# Patient Record
Sex: Female | Born: 1950
Health system: Southern US, Community
[De-identification: ages and names within clinical notes are randomized; demographics above are authoritative.]

## PROBLEM LIST (undated history)

## (undated) DIAGNOSIS — E113599 Type 2 diabetes mellitus with proliferative diabetic retinopathy without macular edema, unspecified eye: Secondary | ICD-10-CM

## (undated) DIAGNOSIS — K219 Gastro-esophageal reflux disease without esophagitis: Secondary | ICD-10-CM

## (undated) DIAGNOSIS — I10 Essential (primary) hypertension: Secondary | ICD-10-CM

## (undated) DIAGNOSIS — Z9849 Cataract extraction status, unspecified eye: Secondary | ICD-10-CM

## (undated) DIAGNOSIS — S91209A Unspecified open wound of unspecified toe(s) with damage to nail, initial encounter: Secondary | ICD-10-CM

## (undated) DIAGNOSIS — G20A1 Parkinson's disease without dyskinesia, without mention of fluctuations: Secondary | ICD-10-CM

## (undated) DIAGNOSIS — G20 Parkinson's disease: Secondary | ICD-10-CM

## (undated) DIAGNOSIS — E785 Hyperlipidemia, unspecified: Secondary | ICD-10-CM

## (undated) DIAGNOSIS — I2510 Atherosclerotic heart disease of native coronary artery without angina pectoris: Secondary | ICD-10-CM

## (undated) HISTORY — DX: Unspecified open wound of unspecified toe(s) with damage to nail, initial encounter: S91.209A

## (undated) HISTORY — PX: OTHER SURGICAL HISTORY: SHX169

## (undated) HISTORY — DX: Type 2 diabetes mellitus with proliferative diabetic retinopathy without macular edema, unspecified eye: E11.3599

## (undated) HISTORY — DX: Hyperlipidemia, unspecified: E78.5

## (undated) HISTORY — DX: Cataract extraction status, unspecified eye: Z98.49

## (undated) HISTORY — DX: Atherosclerotic heart disease of native coronary artery without angina pectoris: I25.10

## (undated) HISTORY — PX: CARDIAC CATHETERIZATION: SHX172

---

## 1999-05-16 ENCOUNTER — Encounter: Payer: Self-pay | Admitting: Cardiology

## 1999-05-16 ENCOUNTER — Ambulatory Visit (HOSPITAL_COMMUNITY): Admission: RE | Admit: 1999-05-16 | Discharge: 1999-05-16 | Payer: Self-pay | Admitting: Cardiology

## 2002-08-26 ENCOUNTER — Emergency Department (HOSPITAL_COMMUNITY): Admission: AD | Admit: 2002-08-26 | Discharge: 2002-08-26 | Payer: Self-pay | Admitting: *Deleted

## 2002-08-31 ENCOUNTER — Encounter: Admission: RE | Admit: 2002-08-31 | Discharge: 2002-08-31 | Payer: Self-pay | Admitting: Internal Medicine

## 2002-09-21 ENCOUNTER — Encounter: Admission: RE | Admit: 2002-09-21 | Discharge: 2002-09-21 | Payer: Self-pay | Admitting: Internal Medicine

## 2002-10-10 ENCOUNTER — Encounter: Admission: RE | Admit: 2002-10-10 | Discharge: 2002-10-10 | Payer: Self-pay | Admitting: Internal Medicine

## 2002-10-10 ENCOUNTER — Ambulatory Visit (HOSPITAL_COMMUNITY): Admission: RE | Admit: 2002-10-10 | Discharge: 2002-10-10 | Payer: Self-pay | Admitting: Internal Medicine

## 2002-10-23 ENCOUNTER — Encounter: Admission: RE | Admit: 2002-10-23 | Discharge: 2003-01-21 | Payer: Self-pay | Admitting: Internal Medicine

## 2002-10-24 ENCOUNTER — Encounter: Admission: RE | Admit: 2002-10-24 | Discharge: 2002-10-24 | Payer: Self-pay | Admitting: Internal Medicine

## 2002-11-07 ENCOUNTER — Encounter: Admission: RE | Admit: 2002-11-07 | Discharge: 2002-11-07 | Payer: Self-pay | Admitting: Internal Medicine

## 2002-11-14 ENCOUNTER — Emergency Department (HOSPITAL_COMMUNITY): Admission: EM | Admit: 2002-11-14 | Discharge: 2002-11-15 | Payer: Self-pay | Admitting: Emergency Medicine

## 2002-11-22 ENCOUNTER — Encounter: Admission: RE | Admit: 2002-11-22 | Discharge: 2002-11-22 | Payer: Self-pay | Admitting: Internal Medicine

## 2002-12-22 ENCOUNTER — Encounter: Admission: RE | Admit: 2002-12-22 | Discharge: 2002-12-22 | Payer: Self-pay | Admitting: Internal Medicine

## 2003-02-07 ENCOUNTER — Ambulatory Visit (HOSPITAL_COMMUNITY): Admission: RE | Admit: 2003-02-07 | Discharge: 2003-02-07 | Payer: Self-pay | Admitting: Infectious Diseases

## 2003-02-07 ENCOUNTER — Encounter: Admission: RE | Admit: 2003-02-07 | Discharge: 2003-02-07 | Payer: Self-pay | Admitting: Internal Medicine

## 2003-02-15 ENCOUNTER — Encounter: Admission: RE | Admit: 2003-02-15 | Discharge: 2003-02-15 | Payer: Self-pay | Admitting: Internal Medicine

## 2003-05-03 ENCOUNTER — Encounter: Admission: RE | Admit: 2003-05-03 | Discharge: 2003-05-03 | Payer: Self-pay | Admitting: Internal Medicine

## 2004-05-09 ENCOUNTER — Ambulatory Visit: Payer: Self-pay | Admitting: Internal Medicine

## 2004-05-23 ENCOUNTER — Ambulatory Visit: Payer: Self-pay | Admitting: Internal Medicine

## 2004-06-17 ENCOUNTER — Ambulatory Visit: Payer: Self-pay | Admitting: Internal Medicine

## 2004-09-25 ENCOUNTER — Ambulatory Visit: Payer: Self-pay | Admitting: Internal Medicine

## 2004-11-06 ENCOUNTER — Ambulatory Visit: Payer: Self-pay | Admitting: Internal Medicine

## 2004-11-20 ENCOUNTER — Ambulatory Visit (HOSPITAL_COMMUNITY): Admission: RE | Admit: 2004-11-20 | Discharge: 2004-11-20 | Payer: Self-pay | Admitting: Internal Medicine

## 2004-11-20 ENCOUNTER — Ambulatory Visit: Payer: Self-pay | Admitting: Internal Medicine

## 2004-12-02 ENCOUNTER — Ambulatory Visit: Payer: Self-pay | Admitting: Internal Medicine

## 2004-12-24 ENCOUNTER — Ambulatory Visit (HOSPITAL_COMMUNITY): Admission: RE | Admit: 2004-12-24 | Discharge: 2004-12-24 | Payer: Self-pay | Admitting: Internal Medicine

## 2005-04-28 ENCOUNTER — Ambulatory Visit: Payer: Self-pay | Admitting: Internal Medicine

## 2005-05-27 ENCOUNTER — Ambulatory Visit: Payer: Self-pay | Admitting: Internal Medicine

## 2005-06-24 ENCOUNTER — Ambulatory Visit: Payer: Self-pay | Admitting: Internal Medicine

## 2005-07-15 ENCOUNTER — Encounter: Payer: Self-pay | Admitting: Vascular Surgery

## 2005-07-15 ENCOUNTER — Ambulatory Visit (HOSPITAL_COMMUNITY): Admission: RE | Admit: 2005-07-15 | Discharge: 2005-07-15 | Payer: Self-pay | Admitting: Internal Medicine

## 2005-07-15 ENCOUNTER — Encounter (INDEPENDENT_AMBULATORY_CARE_PROVIDER_SITE_OTHER): Payer: Self-pay | Admitting: *Deleted

## 2006-02-12 ENCOUNTER — Ambulatory Visit (HOSPITAL_COMMUNITY): Admission: RE | Admit: 2006-02-12 | Discharge: 2006-02-12 | Payer: Self-pay | Admitting: Family Medicine

## 2006-07-08 ENCOUNTER — Encounter: Admission: RE | Admit: 2006-07-08 | Discharge: 2006-07-08 | Payer: Self-pay | Admitting: Orthopaedic Surgery

## 2006-07-27 ENCOUNTER — Encounter: Admission: RE | Admit: 2006-07-27 | Discharge: 2006-07-27 | Payer: Self-pay | Admitting: Orthopaedic Surgery

## 2008-04-13 DIAGNOSIS — E113599 Type 2 diabetes mellitus with proliferative diabetic retinopathy without macular edema, unspecified eye: Secondary | ICD-10-CM

## 2008-04-13 DIAGNOSIS — R011 Cardiac murmur, unspecified: Secondary | ICD-10-CM

## 2008-04-13 DIAGNOSIS — I10 Essential (primary) hypertension: Secondary | ICD-10-CM

## 2008-04-13 DIAGNOSIS — I2510 Atherosclerotic heart disease of native coronary artery without angina pectoris: Secondary | ICD-10-CM

## 2008-04-13 DIAGNOSIS — E785 Hyperlipidemia, unspecified: Secondary | ICD-10-CM

## 2008-04-13 DIAGNOSIS — E113593 Type 2 diabetes mellitus with proliferative diabetic retinopathy without macular edema, bilateral: Secondary | ICD-10-CM

## 2008-04-13 HISTORY — DX: Type 2 diabetes mellitus with proliferative diabetic retinopathy without macular edema, unspecified eye: E11.3599

## 2008-04-13 HISTORY — DX: Atherosclerotic heart disease of native coronary artery without angina pectoris: I25.10

## 2008-04-13 HISTORY — DX: Hyperlipidemia, unspecified: E78.5

## 2008-04-17 ENCOUNTER — Ambulatory Visit: Payer: Self-pay | Admitting: Cardiovascular Disease

## 2008-04-17 ENCOUNTER — Encounter: Payer: Self-pay | Admitting: Cardiovascular Disease

## 2008-04-26 ENCOUNTER — Telehealth (INDEPENDENT_AMBULATORY_CARE_PROVIDER_SITE_OTHER): Payer: Self-pay | Admitting: Radiology

## 2008-04-30 ENCOUNTER — Ambulatory Visit: Payer: Self-pay

## 2008-04-30 ENCOUNTER — Encounter: Payer: Self-pay | Admitting: Cardiology

## 2008-05-09 ENCOUNTER — Ambulatory Visit: Payer: Self-pay | Admitting: Cardiology

## 2008-05-09 LAB — CONVERTED CEMR LAB: Pro B Natriuretic peptide (BNP): 36 pg/mL (ref 0.0–100.0)

## 2008-08-30 ENCOUNTER — Encounter (INDEPENDENT_AMBULATORY_CARE_PROVIDER_SITE_OTHER): Payer: Self-pay | Admitting: *Deleted

## 2010-05-23 NOTE — Cardiovascular Report (Signed)
[place]  Patient:    Destiny Russell, Destiny Russell                     MRN: [id number] Proc. Date: 05/16/99 Adm. Date:  [id number] Disc. Date: [id number] Attending:  Ronaldo Miyamoto CC:         Cardiac Catheterization Lab             Tammy R. Collins [name], [name]             Madolyn Frieze Jens Som, [name] [facility]                        Cardiac Catheterization  INDICATIONS:  Destiny Russell is a pleasant 60 year old diabetic, hypertensive white female with a history of chest discomfort.  This has gotten slightly worse over the past year.  She is now referred for cardiac catheterization.  PROCEDURE: 1. Left heart catheterization. 2. Selective coronary angiography. 3. Selective left ventriculography.  DESCRIPTION OF PROCEDURE:  The procedure was performed from the right femoral artery using 6-French catheters.  She tolerated the procedure without complications.  Intracoronary nitroglycerin was given in the right coronary artery.  HEMODYNAMIC DATA:  The initial central aortic pressure was 150/80.  LV pressure 137/15.  No gradient was recorded on pullback across the aortic valve.  ANGIOGRAPHIC DATA:  Ventriculography in the RAO projection reveals preserved global systolic function.  Ejection fraction was estimated at greater than 60%.  No segmental wall motion abnormalities were identified.  1. The left main coronary artery was free of significant disease. 2. The left anterior descending artery courses to the apex.  No focal areas of    abnormality are noted in the distal LAD although it tapers and covers the    distal portion of the inferior wall.  There is a major first diagonal    branch.  The diagonal branch has a sub branch that is small and has about    an 80% area of stenosis in it. 3. The circumflex provides one major marginal branch.  There is minimal    luminal irregularity in the mid portion of the vessel.  In the distal most    portion of the vessel, the  vessel tapers, and there is a 90% stenosis in an    area of a vessel that measures less than 1 mm in size.  This is in the    distal most aspect of the circumflex. 4. The right coronary artery is a dominant vessel.  There is a modest sized    acute marginal branch that is small in caliber and has about an 80%    stenosis proximally.  This vessel is about 1-1.5 mm in size.  The posterior    descending and posterolateral systems are relatively small in caliber as    well without critical disease.  CONCLUSIONS: 1. Normal left ventricular function. 2. Significant lesions involving a diagonal sub branch, very distal circumflex    marginal, and acute marginal in the right coronary artery.  DISPOSITION:  These vessels are all very small in caliber and not ideal for percutaneous intervention.  Given their distal location, we would recommend medical therapy.  They have the typical diabetic appearance.    DD:  05/16/99 TD:  05/18/99 Job: [id number]

## 2011-03-09 ENCOUNTER — Other Ambulatory Visit: Payer: Self-pay | Admitting: Internal Medicine

## 2011-03-09 MED ORDER — ATENOLOL 100 MG PO TABS: 100.0000 mg | ORAL_TABLET | Freq: Every day | ORAL | Status: DC

## 2011-03-09 NOTE — Progress Notes (Signed)
Pt has new appt later this month. Out of or just about of of med. I refilled med for one month so that pt would be on home meds when sees MD to see level of control and expedite care. I know I am taking a risk as I have no labs or BP measurement but the risks of continuing meds for 30 days prior to new pt appt is low and I am willing to accept that risk so that we have accurate BP on home meds when she sees Dr Gilford Rile later this month.

## 2011-03-27 ENCOUNTER — Ambulatory Visit (INDEPENDENT_AMBULATORY_CARE_PROVIDER_SITE_OTHER): Payer: Self-pay | Admitting: Internal Medicine

## 2011-03-27 ENCOUNTER — Encounter: Payer: Self-pay | Admitting: Internal Medicine

## 2011-03-27 VITALS — BP 176/87 | HR 63 | Temp 97.1°F | Ht 64.0 in | Wt 213.1 lb

## 2011-03-27 DIAGNOSIS — I2510 Atherosclerotic heart disease of native coronary artery without angina pectoris: Secondary | ICD-10-CM

## 2011-03-27 DIAGNOSIS — E785 Hyperlipidemia, unspecified: Secondary | ICD-10-CM

## 2011-03-27 DIAGNOSIS — Z79899 Other long term (current) drug therapy: Secondary | ICD-10-CM

## 2011-03-27 DIAGNOSIS — E119 Type 2 diabetes mellitus without complications: Secondary | ICD-10-CM

## 2011-03-27 DIAGNOSIS — I10 Essential (primary) hypertension: Secondary | ICD-10-CM

## 2011-03-27 DIAGNOSIS — Z8639 Personal history of other endocrine, nutritional and metabolic disease: Secondary | ICD-10-CM

## 2011-03-27 MED ORDER — INSULIN NPH ISOPHANE & REGULAR (70-30) 100 UNIT/ML ~~LOC~~ SUSP: SUBCUTANEOUS | Status: DC

## 2011-03-27 MED ORDER — ENALAPRIL MALEATE 20 MG PO TABS: 20.0000 mg | ORAL_TABLET | Freq: Two times a day (BID) | ORAL | Status: DC

## 2011-03-27 MED ORDER — ENALAPRIL MALEATE 20 MG PO TABS: 20.0000 mg | ORAL_TABLET | Freq: Every day | ORAL | Status: DC

## 2011-03-27 MED ORDER — METFORMIN HCL 1000 MG PO TABS: 1000.0000 mg | ORAL_TABLET | Freq: Two times a day (BID) | ORAL | Status: DC

## 2011-03-27 MED ORDER — ASPIRIN EC 81 MG PO TBEC: 81.0000 mg | DELAYED_RELEASE_TABLET | Freq: Every day | ORAL | Status: AC

## 2011-03-27 NOTE — Patient Instructions (Signed)
She states her insulin 40 units in the morning and 30 units in the evening Increase enalapril to 20 mg twice daily.

## 2011-03-27 NOTE — Assessment & Plan Note (Signed)
Poorly controlled on atenolol and enalapril, increase enalapril to 20 mg twice daily. If her blood pressure remains elevated at next visit consider adding a diuretic

## 2011-03-27 NOTE — Assessment & Plan Note (Signed)
Poorly controlled, A1c 11.6 today, advised the patient to take insulin 70/30 twice a day with a dose of 40 units in the morning and 30 units in the evening. Also advised to check sugars and come back in 2 weeks.

## 2011-03-27 NOTE — Progress Notes (Signed)
Patient ID: Destiny Russell, female   DOB: 03/26/1950, 61 y.o.   MRN: [id number]  HPI:   Patient is a pleasant 60 year old female presents to the clinic to establish care, patient is also working with our Child psychotherapist to obtain the [employer] card [employer] purposes. Patient denies any complaints, but does report that she has not been compliant with her medications due to cost, she is taking insulin once a day, and not compliant with her oral agents. No other complaints today  Review of Systems: Negative except per history of present illness  Physical Exam:  Nursing notes and vitals reviewed General:  alert, well-developed, and cooperative to examination.   Lungs:  normal respiratory effort, no accessory muscle use, normal breath sounds, no crackles, and no wheezes. Heart:  normal rate, regular rhythm, no murmurs, no gallop, and no rub.   Abdomen:  soft, non-tender, normal bowel sounds, no distention, no guarding, no rebound tenderness, no hepatomegaly, and no splenomegaly.   Extremities:  No cyanosis, clubbing, edema Neurologic:  alert & oriented X3, nonfocal exam  Meds: Medications Prior to Admission  Medication Sig Dispense Refill  . atenolol (TENORMIN) 100 MG tablet Take 1 tablet (100 mg total) by mouth daily.  90 tablet  0   No current facility-administered medications on file as of 03/27/2011.    Allergies: Morphine No past medical history on file. No past surgical history on file. No family history on file. History   Social History  . Marital Status: Married    Spouse Name: N/A    Number of Children: N/A  . Years of Education: N/A   Occupational History  . Not on file.   Social History Main Topics  . Smoking status: Never Smoker   . Smokeless tobacco: Not on file  . Alcohol Use: Not on file  . Drug Use: Not on file  . Sexually Active: Not on file   Other Topics Concern  . Not on file   Social History Narrative  . No narrative on file

## 2011-03-27 NOTE — Assessment & Plan Note (Addendum)
Not on a statin, check lipid panel today

## 2011-03-28 LAB — COMPREHENSIVE METABOLIC PANEL
ALT: 51 U/L — ABNORMAL HIGH (ref 0–35)
AST: 48 U/L — ABNORMAL HIGH (ref 0–37)
Albumin: 4.1 g/dL (ref 3.5–5.2)
Alkaline Phosphatase: 82 U/L (ref 39–117)
BUN: 20 mg/dL (ref 6–23)
Calcium: 9.6 mg/dL (ref 8.4–10.5)
Chloride: 104 mEq/L (ref 96–112)
Potassium: 3.9 mEq/L (ref 3.5–5.3)
Sodium: 140 mEq/L (ref 135–145)
Total Protein: 6.8 g/dL (ref 6.0–8.3)

## 2011-03-28 LAB — CBC
Hemoglobin: 16 g/dL — ABNORMAL HIGH (ref 12.0–15.0)
MCH: 30.1 pg (ref 26.0–34.0)
MCHC: 33.8 g/dL (ref 30.0–36.0)
RDW: 12.6 % (ref 11.5–15.5)

## 2011-03-28 LAB — MICROALBUMIN / CREATININE URINE RATIO
Creatinine, Urine: 90.2 mg/dL
Microalb, Ur: 2.86 mg/dL — ABNORMAL HIGH (ref 0.00–1.89)

## 2011-03-28 LAB — LIPID PANEL
Cholesterol: 175 mg/dL (ref 0–200)
LDL Cholesterol: 99 mg/dL (ref 0–99)
Total CHOL/HDL Ratio: 3 Ratio
VLDL: 18 mg/dL (ref 0–40)

## 2011-04-13 ENCOUNTER — Encounter: Payer: Self-pay | Admitting: Internal Medicine

## 2011-04-13 ENCOUNTER — Ambulatory Visit (INDEPENDENT_AMBULATORY_CARE_PROVIDER_SITE_OTHER): Payer: Self-pay | Admitting: Internal Medicine

## 2011-04-13 VITALS — BP 145/80 | HR 65 | Temp 97.6°F | Ht 63.0 in | Wt 212.7 lb

## 2011-04-13 DIAGNOSIS — E119 Type 2 diabetes mellitus without complications: Secondary | ICD-10-CM

## 2011-04-13 DIAGNOSIS — I10 Essential (primary) hypertension: Secondary | ICD-10-CM

## 2011-04-13 DIAGNOSIS — Z862 Personal history of diseases of the blood and blood-forming organs and certain disorders involving the immune mechanism: Secondary | ICD-10-CM

## 2011-04-13 DIAGNOSIS — I2510 Atherosclerotic heart disease of native coronary artery without angina pectoris: Secondary | ICD-10-CM

## 2011-04-13 DIAGNOSIS — Z8639 Personal history of other endocrine, nutritional and metabolic disease: Secondary | ICD-10-CM

## 2011-04-13 LAB — GLUCOSE, CAPILLARY: Glucose-Capillary: 99 mg/dL (ref 70–99)

## 2011-04-13 MED ORDER — LISINOPRIL-HYDROCHLOROTHIAZIDE 20-12.5 MG PO TABS: 1.0000 | ORAL_TABLET | Freq: Every day | ORAL | Status: DC

## 2011-04-13 NOTE — Assessment & Plan Note (Signed)
Metformin and insulin 70/30 (she actually takes 75/25 which her son who is also a diabetic gets for free from his pcp) 40 units in am and 30 in pm. Also metformin 1000 bid.  Not checking sugar at home

## 2011-04-13 NOTE — Assessment & Plan Note (Signed)
Not controlled On enalepril 20 bid and atenolol 100 qd  Plan - change to lisinopril-hctz 20-12.5 qd with scope to go up to 20-25 next visit if needed - check chem next visit

## 2011-04-13 NOTE — Assessment & Plan Note (Signed)
continue Asprin 81mg  qd

## 2011-04-13 NOTE — Progress Notes (Signed)
Patient ID: Destiny Russell, female   DOB: Aug 17, 1950, 61 y.o.   MRN: [id number]  61 Y/o w with pmh listed below comes for checkup No complaints today Complaint with medications No medications side effect Uptodate on refills See individual a/p for further details.   Physical exam  General Appearance:     Filed Vitals:   04/13/11 1516  BP: 145/80  Pulse: 65  Temp: 97.6 F (36.4 C)  TempSrc: Oral  Height: 5\' 3"  (1.6 m)  Weight: 212 lb 11.2 oz (96.48 kg)  SpO2: 97%     Alert, cooperative, no distress, appears stated age  Head:    Normocephalic, without obvious abnormality, atraumatic  Eyes:    PERRL, conjunctiva/corneas clear, EOM's intact, fundi    benign, both eyes       Neck:   Supple, symmetrical, trachea midline, no adenopathy;       thyroid:  No enlargement/tenderness/nodules; no carotid   bruit or JVD  Lungs:     Clear to auscultation bilaterally, respirations unlabored  Chest wall:    No tenderness or deformity  Heart:    Regular rate and rhythm, S1 and S2 normal, no murmur, rub   or gallop  Abdomen:     Soft, non-tender, bowel sounds active all four quadrants,    no masses, no organomegaly  Extremities:   Extremities normal, atraumatic, no cyanosis or edema  Pulses:   2+ and symmetric all extremities  Skin:   Skin color, texture, turgor normal, no rashes or lesions  Neurologic:  nonfocal grossly    ROS  Constitutional: Denies fever, chills, diaphoresis, appetite change and fatigue.  Respiratory: Denies SOB, DOE, cough, chest tightness,  and wheezing.   Cardiovascular: Denies chest pain, palpitations and leg swelling.  Gastrointestinal: Denies nausea, vomiting, abdominal pain, diarrhea, constipation, blood in stool and abdominal distention.  Skin: Denies pallor, rash and wound.  Neurological: Denies dizziness, light-headedness, numbness and headaches.

## 2011-05-04 ENCOUNTER — Ambulatory Visit (INDEPENDENT_AMBULATORY_CARE_PROVIDER_SITE_OTHER): Payer: Self-pay | Admitting: Dietician

## 2011-05-04 VITALS — Ht 63.0 in | Wt 211.9 lb

## 2011-05-04 DIAGNOSIS — Z8639 Personal history of other endocrine, nutritional and metabolic disease: Secondary | ICD-10-CM

## 2011-05-04 DIAGNOSIS — Z862 Personal history of diseases of the blood and blood-forming organs and certain disorders involving the immune mechanism: Secondary | ICD-10-CM

## 2011-05-04 NOTE — Progress Notes (Signed)
Diabetes Self-Management Training (DSMT)  Initial Visit  05/04/2011 Ms. Destiny Russell, identified by name and date of birth, is a 61 y.o. female with Type 2 Diabetes. Year of diabetes diagnosis: > 10 yrs Other persons present: spouse/SO and family member patient and mother  ASSESSMENT Patient concerns are Healthy Lifestyle and Glycemic control.  Height 5\' 3"  (1.6 m), weight 211 lb 14.4 oz (96.117 kg). Body mass index is 37.54 kg/(m^2). Lab Results  Component Value Date   LDLCALC 99 03/27/2011   Lab Results  Component Value Date   HGBA1C 11.6 03/27/2011   Labs and snapshot reviewed.  Family history of diabetes: No Patients belief/attitude about diabetes: Diabetes can be controlled. Self foot exams daily: Yes Diabetes Complications: {DM COMPLICATIONS:20296 Support systems: spouse and mother Special needs: Simplified materials Prior DM Education: Yes   Medications See Medications list.  Not taking as prescribed and Is interested in learning more    Exercise Plan Doing walking for 60 minutesa day most days of the week.   Self-Monitoring Medication Nutrition Monitor: none-recommended she purchase [employer] meter today and use twice a day except one day a week 4-6 times Frequency of testing: stopped testing Lunch: 237 today in office ~ 3 hours after meal  Hyperglycemia: Yes Rarely Hypoglycemia: Yes Rarely   Meal Planning Knowledgable and Interested in improving   Assessment comments: Verbalizes fairly good comprehension about healthier meal options, but reports husband will not eat healthy foods. Eats good amount of vegetables uses butter, fries foods and convenience foods.     INDIVIDUAL DIABETES EDUCATION PLAN:  Nutrition management Medication Monitoring Goal setting _______________________________________________________________________  Intervention TOPICS COVERED TODAY:  Nutrition management  Role of diet in the treatment of diabetes and the relationship  between the three main macronutritents and blood glucose control. Reviewed blood glucose goals for pre and post meals and how to evaluate the patients' food intake on their blood glucose level. Information on hints to eating out and maintain blood glucose control. Medication  Reviewed patients medication for diabetes, action, purpose, timing of dose and side effects. Monitoring  Purpose and frequency of SMBG. reasonably priced meters and how to stretch strip dollars Goal setting  Lifestyle issues that need to be addressed for better diabetes care Helped patient develop diabetes management plan for healthier foods choices  PATIENTS GOALS/PLAN (copy and paste in patient instructions so patient receives a copy): 1.  Learning Objective:       Know how to shop and cook healthier meals on a budget 2.  Behavioral Objective:         Medications: To improve blood glucose levels, I will take Pm insulin before meal Never 0% Monitoring: To identify blood glucose trends, I will test my blood glucose at least trwice a day day Never 0%  Personalized Follow-Up Plan for Ongoing Self Management Support:  Doctor's Office, friends, family and CDE visits      ______________________________________________________________________   Outcomes Expected outcomes: Demonstrated interest in learning.Expect positive changes in lifestyle. Self-care Barriers: Lack of material resources Education material provided: recipes Patient to contact team via Phone if problems or questions. Time in: 1330     Time out: 1400 and 1430 Future DSMT - PRN   Amos Gaber, [name]

## 2011-05-04 NOTE — Patient Instructions (Signed)
Please take your insulin before dinner  Please use canola oil instead of vegetable oil, eat more vegetables, less fat, Try to oven fry rather than pan or deep fry. ( Decrease salt, oil and fat.)  Use fresh potatoes to make mashed potatoes and fries from fresh potatoes.  Use fresh meat from  Fresh section rather.  Please check blood sugars as discussed, bring record so we can help you more using this information.

## 2011-06-02 ENCOUNTER — Other Ambulatory Visit: Payer: Self-pay | Admitting: Internal Medicine

## 2011-06-02 NOTE — Telephone Encounter (Signed)
Will submit refill for 1 month.  Patient needs to come in for OV for BP check and to address preventative health care.  Will not refill next request unless she is seen by physician

## 2011-06-03 NOTE — Telephone Encounter (Signed)
Message sent to front pool - to sch appt within 1 month per Dr Arvilla Market.

## 2011-06-04 ENCOUNTER — Encounter: Payer: Self-pay | Admitting: Internal Medicine

## 2011-07-06 ENCOUNTER — Ambulatory Visit (INDEPENDENT_AMBULATORY_CARE_PROVIDER_SITE_OTHER): Payer: Self-pay | Admitting: Internal Medicine

## 2011-07-06 ENCOUNTER — Encounter: Payer: Self-pay | Admitting: Internal Medicine

## 2011-07-06 VITALS — BP 126/73 | HR 85 | Temp 97.3°F | Ht 62.0 in | Wt 206.3 lb

## 2011-07-06 DIAGNOSIS — R7402 Elevation of levels of lactic acid dehydrogenase (LDH): Secondary | ICD-10-CM

## 2011-07-06 DIAGNOSIS — E119 Type 2 diabetes mellitus without complications: Secondary | ICD-10-CM

## 2011-07-06 DIAGNOSIS — I10 Essential (primary) hypertension: Secondary | ICD-10-CM

## 2011-07-06 DIAGNOSIS — Z862 Personal history of diseases of the blood and blood-forming organs and certain disorders involving the immune mechanism: Secondary | ICD-10-CM

## 2011-07-06 DIAGNOSIS — Z79899 Other long term (current) drug therapy: Secondary | ICD-10-CM

## 2011-07-06 MED ORDER — INSULIN LISPRO PROT & LISPRO (75-25 MIX) 100 UNIT/ML ~~LOC~~ SUSP: SUBCUTANEOUS | Status: DC

## 2011-07-06 MED ORDER — LISINOPRIL-HYDROCHLOROTHIAZIDE 20-12.5 MG PO TABS: 1.0000 | ORAL_TABLET | Freq: Every day | ORAL | Status: DC

## 2011-07-06 MED ORDER — METFORMIN HCL 1000 MG PO TABS: 1000.0000 mg | ORAL_TABLET | Freq: Two times a day (BID) | ORAL | Status: DC

## 2011-07-06 NOTE — Patient Instructions (Addendum)
1.  I decreased to your morning insulin dose from 40 units to 35 units in the morning. Please continue to take 30 units in the evening. 2. Please take all medications as prescribed.  3. If you have worsening of your symptoms or new symptoms arise, please call the clinic ([phone]), or go to the ER immediately if symptoms are severe.  You have done great job in taking all your medications. I appreciate it very much. Please continue doing that.

## 2011-07-06 NOTE — Progress Notes (Signed)
Patient ID: Destiny Russell, female   DOB: 1950/04/12, 61 y.o.   MRN: [id number]  Subjective:   Patient ID: Destiny Russell female   DOB: [birth year] 61 y.o.   MRN: [id number]  HPI: [name] is a 61 y.o. with a past medical history as outlined below, who presents with a regular checkup.  Patient reports that she has been taking all her medications regularly. She reports that she is taking 70/25 insulin, 40 units in morning and 30 units in evening. She experienced hypoglycemic symptoms approximately once a week, which happens mostly before lunch. She does not have meter with her today. She does not check her CBG regularly at home because of financial difficulties. She talked with our financial worker in last visit. She is going to sign some documentations for orange card in financial worker's office today.   She does not have any new complaints. She doesn't have chest pain, shortness of breath, cough, abdominal pain, joint pain, rashes, hematuria, hematochezia.  No past medical history on file. Current Outpatient Prescriptions  Medication Sig Dispense Refill  . aspirin EC 81 MG tablet Take 1 tablet (81 mg total) by mouth daily.  150 tablet  2  . atenolol (TENORMIN) 100 MG tablet TAKE ONE TABLET BY MOUTH DAILY  30 tablet  0  . insulin lispro protamine-insulin lispro (HUMALOG 75/25) (75-25) 100 UNIT/ML SUSP 35 units before breakfast and 30 units before dinner  10 mL  5  . lisinopril-hydrochlorothiazide (PRINZIDE,ZESTORETIC) 20-12.5 MG per tablet Take 1 tablet by mouth daily.  30 tablet  5  . metFORMIN (GLUCOPHAGE) 1000 MG tablet Take 1 tablet (1,000 mg total) by mouth 2 (two) times daily with a meal.  60 tablet  11  . DISCONTD: insulin lispro protamine-insulin lispro (HUMALOG 75/25) (75-25) 100 UNIT/ML SUSP Inject into the skin. 40 units before breakfast and 30 units before dinner      . DISCONTD: lisinopril-hydrochlorothiazide (PRINZIDE,ZESTORETIC) 20-12.5 MG per tablet Take 1 tablet by  mouth daily.  31 tablet  3  . DISCONTD: metFORMIN (GLUCOPHAGE) 1000 MG tablet Take 1 tablet (1,000 mg total) by mouth 2 (two) times daily with a meal.  60 tablet  11   No family history on file. History   Social History  . Marital Status: Married    Spouse Name: N/A    Number of Children: N/A  . Years of Education: N/A   Social History Main Topics  . Smoking status: Never Smoker   . Smokeless tobacco: None  . Alcohol Use: None  . Drug Use: None  . Sexually Active: None   Other Topics Concern  . None   Social History Narrative  . None   Review of Systems:  General: no fevers, chills, no changes in body weight, no changes in appetite Skin: no rash HEENT: no blurry vision, hearing changes or sore throat Pulm: no dyspnea, coughing, wheezing CV: no chest pain, palpitations, shortness of breath Abd: no nausea/vomiting, abdominal pain, diarrhea/constipation GU: no dysuria, hematuria, polyuria Ext: no arthralgias, myalgias Neuro: no weakness, numbness, or tingling  Objective:  Physical Exam: Filed Vitals:   07/06/11 1452  BP: 126/73  Pulse: 85  Temp: 97.3 F (36.3 C)  TempSrc: Oral  Height: 5\' 2"  (1.575 m)  Weight: 206 lb 4.8 oz (93.577 kg)   General: resting in bed, not in acute distress HEENT: PERRL, EOMI, no scleral icterus Cardiac: S1/S2, RRR, No murmurs, gallops or rubs Pulm: Good air movement bilaterally, Clear to auscultation bilaterally, No  rales, wheezing, rhonchi or rubs. Abd: Soft,  nondistended, nontender, no rebound pain, no organomegaly, BS present Ext: No rashes or edema, 2+DP/PT pulse bilaterally Musculoskeletal: No joint deformities, erythema, or stiffness, ROM full and no nontender Skin: no rashes. No skin bruise. Neuro: alert and oriented X3, cranial nerves II-XII grossly intact, muscle strength 5/5 in all extremeties,  sensation to light touch intact.  Psych.: patient is not psychotic, no suicidal or hemocidal ideation.   Assessment & Plan:    Diabetes: Patient experienced hypoglycemia symptoms, approximately once a week, which happens mostly before lunch.  Will decrease her 70/25 insulin dosage from 40 units to 35 units in the morning. Continue to take 30 units in evening.   Hypertension: Her blood pressure is well controlled. Her blood pressure is 124/73 today. Will continue current regimen. We'll give her refills for her blood pressure medications today.  Elevated transaminase: Her transaminase was slightly elevated. AST/ALT was 48/51 in March. Will observe her closely. Will check her liver function when she obtains the orange card.  Financial issue: Patient talked with financial worker in last visit. She is going to sign the documentations in financial worker's office for orange card today.  Health maintenance: She would likely to postpone all healthy maintenance related tests or exams until she obtains the orange card.   Destiny Harp, MD PGY1, Internal Medicine Teaching Service Pager: [phone] e

## 2011-07-08 ENCOUNTER — Other Ambulatory Visit: Payer: Self-pay | Admitting: *Deleted

## 2011-07-08 MED ORDER — ATENOLOL 100 MG PO TABS: 100.0000 mg | ORAL_TABLET | Freq: Every day | ORAL | Status: DC

## 2012-07-14 ENCOUNTER — Other Ambulatory Visit: Payer: Self-pay

## 2012-08-30 ENCOUNTER — Inpatient Hospital Stay (HOSPITAL_COMMUNITY)
Admission: EM | Admit: 2012-08-30 | Discharge: 2012-08-31 | DRG: 639 | Disposition: A | Discharge status: Home / Self Care | Payer: Medicaid Other | Attending: Internal Medicine | Admitting: Internal Medicine

## 2012-08-30 ENCOUNTER — Emergency Department (HOSPITAL_COMMUNITY): Payer: Medicaid Other

## 2012-08-30 ENCOUNTER — Inpatient Hospital Stay (HOSPITAL_COMMUNITY): Payer: Medicaid Other

## 2012-08-30 ENCOUNTER — Encounter (HOSPITAL_COMMUNITY): Payer: Self-pay | Admitting: Emergency Medicine

## 2012-08-30 DIAGNOSIS — I10 Essential (primary) hypertension: Secondary | ICD-10-CM | POA: Diagnosis present

## 2012-08-30 DIAGNOSIS — R4182 Altered mental status, unspecified: Secondary | ICD-10-CM

## 2012-08-30 DIAGNOSIS — E1010 Type 1 diabetes mellitus with ketoacidosis without coma: Principal | ICD-10-CM | POA: Diagnosis present

## 2012-08-30 DIAGNOSIS — R259 Unspecified abnormal involuntary movements: Secondary | ICD-10-CM | POA: Diagnosis present

## 2012-08-30 DIAGNOSIS — E113593 Type 2 diabetes mellitus with proliferative diabetic retinopathy without macular edema, bilateral: Secondary | ICD-10-CM | POA: Diagnosis present

## 2012-08-30 DIAGNOSIS — Z794 Long term (current) use of insulin: Secondary | ICD-10-CM

## 2012-08-30 DIAGNOSIS — R7309 Other abnormal glucose: Secondary | ICD-10-CM

## 2012-08-30 DIAGNOSIS — E1110 Type 2 diabetes mellitus with ketoacidosis without coma: Secondary | ICD-10-CM | POA: Diagnosis present

## 2012-08-30 DIAGNOSIS — R569 Unspecified convulsions: Secondary | ICD-10-CM | POA: Diagnosis present

## 2012-08-30 DIAGNOSIS — R739 Hyperglycemia, unspecified: Secondary | ICD-10-CM

## 2012-08-30 HISTORY — DX: Parkinson's disease without dyskinesia, without mention of fluctuations: G20.A1

## 2012-08-30 HISTORY — DX: Essential (primary) hypertension: I10

## 2012-08-30 HISTORY — DX: Gastro-esophageal reflux disease without esophagitis: K21.9

## 2012-08-30 HISTORY — DX: Parkinson's disease: G20

## 2012-08-30 LAB — HEMOGLOBIN A1C: Mean Plasma Glucose: 369 mg/dL — ABNORMAL HIGH (ref <117)

## 2012-08-30 LAB — URINALYSIS, ROUTINE W REFLEX MICROSCOPIC
Ketones, ur: 40 mg/dL — AB
Leukocytes, UA: NEGATIVE
Nitrite: NEGATIVE
Specific Gravity, Urine: 1.027 (ref 1.005–1.030)
pH: 6 (ref 5.0–8.0)

## 2012-08-30 LAB — URINE MICROSCOPIC-ADD ON

## 2012-08-30 LAB — COMPREHENSIVE METABOLIC PANEL
ALT: 23 U/L (ref 0–35)
AST: 22 U/L (ref 0–37)
Albumin: 4.1 g/dL (ref 3.5–5.2)
Calcium: 9.9 mg/dL (ref 8.4–10.5)
Sodium: 133 mEq/L — ABNORMAL LOW (ref 135–145)
Total Protein: 8 g/dL (ref 6.0–8.3)

## 2012-08-30 LAB — CBC WITH DIFFERENTIAL/PLATELET
Basophils Absolute: 0 10*3/uL (ref 0.0–0.1)
Basophils Relative: 1 % (ref 0–1)
Hemoglobin: 17.3 g/dL — ABNORMAL HIGH (ref 12.0–15.0)
MCHC: 36.6 g/dL — ABNORMAL HIGH (ref 30.0–36.0)
Monocytes Relative: 8 % (ref 3–12)
Neutro Abs: 4.4 10*3/uL (ref 1.7–7.7)
Neutrophils Relative %: 70 % (ref 43–77)
RBC: 5.68 MIL/uL — ABNORMAL HIGH (ref 3.87–5.11)
WBC: 6.3 10*3/uL (ref 4.0–10.5)

## 2012-08-30 LAB — BASIC METABOLIC PANEL
BUN: 15 mg/dL (ref 6–23)
CO2: 26 mEq/L (ref 19–32)
Calcium: 9.2 mg/dL (ref 8.4–10.5)
Calcium: 9.2 mg/dL (ref 8.4–10.5)
Creatinine, Ser: 0.63 mg/dL (ref 0.50–1.10)
GFR calc Af Amer: >90 mL/min (ref >90)
GFR calc non Af Amer: >90 mL/min (ref >90)
GFR calc non Af Amer: >90 mL/min (ref >90)
Glucose, Bld: 420 mg/dL — ABNORMAL HIGH (ref 70–99)
Glucose, Bld: 279 mg/dL — ABNORMAL HIGH (ref 70–99)
Potassium: 4.3 mEq/L (ref 3.5–5.1)
Sodium: 138 mEq/L (ref 135–145)

## 2012-08-30 LAB — GLUCOSE, CAPILLARY
Glucose-Capillary: 473 mg/dL — ABNORMAL HIGH (ref 70–99)
Glucose-Capillary: 236 mg/dL — ABNORMAL HIGH (ref 70–99)
Glucose-Capillary: 132 mg/dL — ABNORMAL HIGH (ref 70–99)
Glucose-Capillary: 148 mg/dL — ABNORMAL HIGH (ref 70–99)

## 2012-08-30 LAB — PROTIME-INR: INR: 0.97 (ref 0.00–1.49)

## 2012-08-30 MED ORDER — POTASSIUM CHLORIDE CRYS ER 20 MEQ PO TBCR
40.0000 meq | EXTENDED_RELEASE_TABLET | Freq: Once | ORAL | Status: AC
  Administered 2012-08-30: 40 meq via ORAL
  Filled 2012-08-30 (×2): qty 1

## 2012-08-30 MED ORDER — DEXTROSE 50 % IV SOLN: 25.0000 mL | INTRAVENOUS | Status: DC | PRN

## 2012-08-30 MED ORDER — ENOXAPARIN SODIUM 40 MG/0.4ML ~~LOC~~ SOLN
40.0000 mg | SUBCUTANEOUS | Status: DC
  Administered 2012-08-30: 40 mg via SUBCUTANEOUS
  Filled 2012-08-30 (×2): qty 0.4

## 2012-08-30 MED ORDER — INSULIN NPH (HUMAN) (ISOPHANE) 100 UNIT/ML ~~LOC~~ SUSP
10.0000 [IU] | Freq: Every day | SUBCUTANEOUS | Status: DC
  Administered 2012-08-30: 5 [IU] via SUBCUTANEOUS
  Filled 2012-08-30: qty 10

## 2012-08-30 MED ORDER — ACETAMINOPHEN 325 MG PO TABS: 650.0000 mg | ORAL_TABLET | Freq: Four times a day (QID) | ORAL | Status: DC | PRN

## 2012-08-30 MED ORDER — ONDANSETRON HCL 4 MG/2ML IJ SOLN: 4.0000 mg | Freq: Four times a day (QID) | INTRAMUSCULAR | Status: DC | PRN

## 2012-08-30 MED ORDER — ASPIRIN EC 81 MG PO TBEC
81.0000 mg | DELAYED_RELEASE_TABLET | Freq: Every day | ORAL | Status: DC
  Administered 2012-08-30 – 2012-08-31 (×2): 81 mg via ORAL
  Filled 2012-08-30 (×2): qty 1

## 2012-08-30 MED ORDER — SODIUM CHLORIDE 0.9 % IV SOLN
1000.0000 mL | Freq: Once | INTRAVENOUS | Status: AC
  Administered 2012-08-30: 1000 mL via INTRAVENOUS

## 2012-08-30 MED ORDER — ATENOLOL 100 MG PO TABS
100.0000 mg | ORAL_TABLET | Freq: Every day | ORAL | Status: DC
  Administered 2012-08-30 – 2012-08-31 (×2): 100 mg via ORAL
  Filled 2012-08-30 (×2): qty 1

## 2012-08-30 MED ORDER — INSULIN ASPART 100 UNIT/ML ~~LOC~~ SOLN
0.0000 [IU] | SUBCUTANEOUS | Status: DC
  Administered 2012-08-30: 3 [IU] via SUBCUTANEOUS
  Administered 2012-08-30: 11:00:00 via SUBCUTANEOUS
  Administered 2012-08-30 – 2012-08-31 (×3): 1 [IU] via SUBCUTANEOUS

## 2012-08-30 MED ORDER — SODIUM CHLORIDE 0.9 % IV BOLUS (SEPSIS)
1000.0000 mL | Freq: Once | INTRAVENOUS | Status: AC
  Administered 2012-08-30: 1000 mL via INTRAVENOUS

## 2012-08-30 MED ORDER — SODIUM CHLORIDE 0.9 % IV SOLN
INTRAVENOUS | Status: DC
  Administered 2012-08-30 – 2012-08-31 (×3): via INTRAVENOUS

## 2012-08-30 MED ORDER — INSULIN NPH (HUMAN) (ISOPHANE) 100 UNIT/ML ~~LOC~~ SUSP
20.0000 [IU] | Freq: Every day | SUBCUTANEOUS | Status: DC
  Administered 2012-08-30: 20 [IU] via SUBCUTANEOUS
  Filled 2012-08-30: qty 10

## 2012-08-30 MED ORDER — SODIUM CHLORIDE 0.9 % IV SOLN
1000.0000 mL | INTRAVENOUS | Status: DC
  Administered 2012-08-30: 1000 mL via INTRAVENOUS

## 2012-08-30 MED ORDER — SODIUM CHLORIDE 0.9 % IV SOLN
INTRAVENOUS | Status: DC
  Filled 2012-08-30: qty 1

## 2012-08-30 NOTE — H&P (Signed)
IM ATTENDING H&P  Date: 08/30/2012  Patient name: Destiny Russell  Medical record number: [id number]  Date of birth: 1950/06/06   This patient has been seen and the plan of care was discussed with the house staff. Please see their note for complete details. I concur with their findings with the following additions/corrections:  I saw [name] and confirmed the history with her.  Briefly, [name] is a 62yo woman with DM, HTN and a reported history of PD who presents with abnormal head movements. She reports that since yesterday evening, early this morning that she has been having episodes of turning her head to the left, smacking her lips and having difficulty getting words out.  They episodes last about 30 seconds, and are shortened when her family is nearby.  She has some confusion afterward, but she is alert during the event.  During one episode she did have a fall, however, she did not have any residual weakness.  Since being in the hospital, she has also experienced face twitching with the episodes.  No loss of bowel/bladder, no weakness, no lightheadedness, no h/o seizure.  She has further history of DM for which she takes insulin once a day, has occasional blurry vision and polyuria/polydipsia.  I inquired further about her history of PD.  She reports that she has decreased phonation for a short while, but getting worse and an occasional head tremor which led her to be diagnosed with PD.  She also has some difficulty swallowing and coughing with eating.  On exam, she has a decreased voice, but is able to speak and get her words out well.  She has no CN deficits and no strength issues, sensation intact to light touch.  She has no rigidity (cogwheel) and no appreciable tremor on exam.  She has had an EEG since being seen by the resident team which did not show any epileptiform changes.  She is due to have an MRI.    I spoke at length with her family who was also in the room with her.  The  differential could include an abnormal movement disorder (? H/o classic PD with her given symptoms) vs cortical stroke (less likely) vs anxiety/stress reaction.  An MRI is ordered, I have asked the resident team to order SLP evaluation for the difficulty swallowing.  For her DM, she initially came in with an AG which has improved without insulin ggt.  Will monitor closely.  Once she is on a solid diet she will likely need her insulin titrated for better control.    Other issues per resident note.   Inez Catalina, MD 08/30/2012, 3:18 PM

## 2012-08-30 NOTE — Progress Notes (Signed)
Spoke with MD on call. Pt ordered to get 10 units of NPH as well as q 4 sliding scale. Last cbg was 148. MD stated to give only 5 units of nph. Will continue to monitor the pt. Sanda Linger, RN

## 2012-08-30 NOTE — Consult Note (Signed)
NEURO HOSPITALIST CONSULT NOTE    Reason for Consult: seizure  HPI:                                                                                                                                         Much of history obtained by chart as patient is non vocal at time of consultation.    Destiny Russell is an 62 y.o. female who was noted on the morning of admission to have episodes of turning her head to the left, smacking her lips, and making a mumbling noise. Per chart, Episodes would last about 30 seconds, then resolve spontaneously, though with confusion and decreased responsiveness following these episodes for about 5 minutes. During one of these episodes, the patient fell, but was able to stand and walk after the episode resolved. Per report from resident, the patient continued to have episodes of head turning and lip smacking once per hour with complete resolution and return to baseline in between episodes. On consultation patient was hooked up to EEG.  Per tech she had a spell just before EEG started.  After photic stimulation I walked over to patient and asked how she was--she started to mumble continuously --there was no arm or leg movement, no eye deviation and no head turing.  This lasted for about 3 minutes.  During event patient withdrew to noxious stimulation in bilateral hand and withdrew from plantar stimulation. Post event patient was able to show me how many fingers I was holding up, follow verbal commands and when asked if she is under stress at home she verbally answered yes.  She would not answer any further questions. Of not: her BP did become elevated after event to 193/98.     Past Medical History  Diagnosis Date  . Parkinson's disease   . Diabetes mellitus without complication   . Hypertension     History reviewed. No pertinent past surgical history.  Family History  Problem Relation Age of Onset  . Hypertension Mother   . Hypertension Father       Social History:  reports that she has never smoked. She does not have any smokeless tobacco history on file. She reports that she does not drink alcohol or use illicit drugs.  Allergies  Allergen Reactions  . Morphine     REACTION: Reaction not known    MEDICATIONS:  Prior to Admission:  Prescriptions prior to admission  Medication Sig Dispense Refill  . aspirin EC 81 MG tablet Take 81 mg by mouth daily.      Marland Kitchen atenolol (TENORMIN) 100 MG tablet Take 1 tablet (100 mg total) by mouth daily.  90 tablet  3  . insulin lispro protamine-lispro (HUMALOG 75/25) (75-25) 100 UNIT/ML SUSP injection Inject 40 Units into the skin daily with breakfast.       Scheduled: . aspirin EC  81 mg Oral Daily  . atenolol  100 mg Oral Daily  . enoxaparin (LOVENOX) injection  40 mg Subcutaneous Q24H  . insulin aspart  0-9 Units Subcutaneous Q4H  . insulin NPH  20 Units Subcutaneous QAC breakfast   And  . insulin NPH  10 Units Subcutaneous QHS  . potassium chloride  40 mEq Oral Once  . sodium chloride  1,000 mL Intravenous Once     ROS:                                                                                                                                       History obtained from unobtainable from patient due to mental status   Blood pressure 136/74, pulse 75, temperature 98.6 F (37 C), temperature source Oral, resp. rate 18, SpO2 94.00%.   Neurologic Examination:                                                                                                       Mental Status: Alert, follows verbal commands and visual commands. Non vocal other than a few words, otherwise stares forward but will follow commands.  Cranial Nerves: II: Discs flat bilaterally; Visual fields grossly normal, pupils equal, round, reactive to light and accommodation III,IV, VI: ptosis not  present, extra-ocular motions intact bilaterally V,VII: face symmetric, facial light touch sensation normal bilaterally VIII: hearing normal bilaterally IX,X: gag reflex present XI: bilateral shoulder shrug XII: midline tongue extension Motor: Right : Upper extremity   5/5    Left:     Upper extremity   5/5  Lower extremity   5/5     Lower extremity   5/5 Tone and bulk:normal tone throughout; no atrophy noted Sensory: Pinprick and light touch intact throughout, bilaterally Deep Tendon Reflexes:  Right: Upper Extremity   Left: Upper extremity   biceps (C-5 to C-6) 2/4   biceps (C-5 to C-6) 2/4 tricep (C7) 2/4    triceps (C7) 2/4 Brachioradialis (C6) 2/4  Brachioradialis (C6) 2/4  Lower Extremity Lower Extremity  quadriceps (L-2 to L-4) 2/4   quadriceps (L-2 to L-4) 2/4 Achilles (S1) 1/4   Achilles (S1) 1/4  Plantars: Right: downgoing   Left: downgoing Cerebellar: normal finger-to-nose,   CV: pulses palpable throughout   Lab Results  Component Value Date/Time   CHOL 175 03/27/2011  4:02 PM    Results for orders placed during the hospital encounter of 08/30/12 (from the past 48 hour(s))  GLUCOSE, CAPILLARY     Status: Abnormal   Collection Time    08/30/12  3:14 AM      Result Value Range   Glucose-Capillary 473 (*) 70 - 99 mg/dL  COMPREHENSIVE METABOLIC PANEL     Status: Abnormal   Collection Time    08/30/12  4:03 AM      Result Value Range   Sodium 133 (*) 135 - 145 mEq/L   Potassium 4.2  3.5 - 5.1 mEq/L   Chloride 94 (*) 96 - 112 mEq/L   CO2 25  19 - 32 mEq/L   Glucose, Bld 481 (*) 70 - 99 mg/dL   BUN 16  6 - 23 mg/dL   Creatinine, Ser 4.09  0.50 - 1.10 mg/dL   Calcium 9.9  8.4 - 81.1 mg/dL   Total Protein 8.0  6.0 - 8.3 g/dL   Albumin 4.1  3.5 - 5.2 g/dL   AST 22  0 - 37 U/L   ALT 23  0 - 35 U/L   Alkaline Phosphatase 106  39 - 117 U/L   Total Bilirubin 0.7  0.3 - 1.2 mg/dL   GFR calc non Af Amer 88 (*) >90 mL/min   GFR calc Af Amer >90  >90 mL/min    Comment: (NOTE)     The eGFR has been calculated using the CKD EPI equation.     This calculation has not been validated in all clinical situations.     eGFR's persistently <90 mL/min signify possible Chronic Kidney     Disease.  PROTIME-INR     Status: None   Collection Time    08/30/12  4:03 AM      Result Value Range   Prothrombin Time 12.7  11.6 - 15.2 seconds   INR 0.97  0.00 - 1.49  CBC WITH DIFFERENTIAL     Status: Abnormal   Collection Time    08/30/12  4:03 AM      Result Value Range   WBC 6.3  4.0 - 10.5 K/uL   RBC 5.68 (*) 3.87 - 5.11 MIL/uL   Hemoglobin 17.3 (*) 12.0 - 15.0 g/dL   HCT 91.4 (*) 78.2 - 95.6 %   MCV 83.3  78.0 - 100.0 fL   MCH 30.5  26.0 - 34.0 pg   MCHC 36.6 (*) 30.0 - 36.0 g/dL   RDW 21.3  08.6 - 57.8 %   Platelets 190  150 - 400 K/uL   Neutrophils Relative % 70  43 - 77 %   Neutro Abs 4.4  1.7 - 7.7 K/uL   Lymphocytes Relative 20  12 - 46 %   Lymphs Abs 1.3  0.7 - 4.0 K/uL   Monocytes Relative 8  3 - 12 %   Monocytes Absolute 0.5  0.1 - 1.0 K/uL   Eosinophils Relative 1  0 - 5 %   Eosinophils Absolute 0.1  0.0 - 0.7 K/uL   Basophils Relative 1  0 - 1 %   Basophils Absolute 0.0  0.0 - 0.1 K/uL  LACTIC ACID, PLASMA     Status: None   Collection Time    08/30/12  4:04 AM      Result Value Range   Lactic Acid, Venous 1.9  0.5 - 2.2 mmol/L  URINALYSIS, ROUTINE W REFLEX MICROSCOPIC     Status: Abnormal   Collection Time    08/30/12  5:10 AM      Result Value Range   Color, Urine STRAW (*) YELLOW   APPearance CLEAR  CLEAR   Specific Gravity, Urine 1.027  1.005 - 1.030   pH 6.0  5.0 - 8.0   Glucose, UA >1000 (*) NEGATIVE mg/dL   Hgb urine dipstick NEGATIVE  NEGATIVE   Bilirubin Urine NEGATIVE  NEGATIVE   Ketones, ur 40 (*) NEGATIVE mg/dL   Protein, ur NEGATIVE  NEGATIVE mg/dL   Urobilinogen, UA 0.2  0.0 - 1.0 mg/dL   Nitrite NEGATIVE  NEGATIVE   Leukocytes, UA NEGATIVE  NEGATIVE  URINE MICROSCOPIC-ADD ON     Status: None   Collection Time     08/30/12  5:10 AM      Result Value Range   Squamous Epithelial / LPF RARE  RARE   WBC, UA 0-2  <3 WBC/hpf   RBC / HPF 0-2  <3 RBC/hpf   Urine-Other RARE YEAST    BASIC METABOLIC PANEL     Status: Abnormal   Collection Time    08/30/12  8:12 AM      Result Value Range   Sodium 138  135 - 145 mEq/L   Potassium 4.3  3.5 - 5.1 mEq/L   Chloride 101  96 - 112 mEq/L   CO2 25  19 - 32 mEq/L   Glucose, Bld 420 (*) 70 - 99 mg/dL   BUN 15  6 - 23 mg/dL   Creatinine, Ser 1.61  0.50 - 1.10 mg/dL   Calcium 9.2  8.4 - 09.6 mg/dL   GFR calc non Af Amer >90  >90 mL/min   GFR calc Af Amer >90  >90 mL/min   Comment: (NOTE)     The eGFR has been calculated using the CKD EPI equation.     This calculation has not been validated in all clinical situations.     eGFR's persistently <90 mL/min signify possible Chronic Kidney     Disease.    Dg Chest 2 View  08/30/2012   *RADIOLOGY REPORT*  Clinical Data: Diabetic ketoacidosis.  Infection evaluation.  CHEST - 2 VIEW  Comparison: Thoracic spine radiographs 02/07/2003  Findings: The heart, mediastinal, and hilar contours are within normal limits.  The lungs are normally expanded and clear.  No focal airspace disease, edema, pleural effusion, or pneumothorax. There are mild typical degenerative changes of the thoracic spine and acromioclavicular joints.  No acute osseous abnormalities identified.  The visualized upper abdomen is unremarkable.  IMPRESSION: No acute cardiopulmonary disease.   Original Report Authenticated By: Britta Mccreedy, M.D.   Ct Head Wo Contrast  08/30/2012   *RADIOLOGY REPORT*  Clinical Data:  Found down, neck pain  CT HEAD WITHOUT CONTRAST CT CERVICAL SPINE WITHOUT CONTRAST  Technique:  Multidetector CT imaging of the head and cervical spine was performed following the standard protocol without intravenous contrast.  Multiplanar CT image reconstructions of the cervical spine were also generated.  Comparison:  02/07/2003 c-spine radiograph   CT HEAD  Findings: Periventricular and subcortical white matter hypodensities are most in keeping with chronic microangiopathic change. There is no evidence for acute hemorrhage, hydrocephalus, mass  lesion, or abnormal extra-axial fluid collection.  No definite CT evidence for acute infarction.  The visualized paranasal sinuses and mastoid air cells are predominately clear.  No displaced calvarial fracture.  IMPRESSION: White matter changes as above.  No CT evidence of acute intracranial abnormality.  CT CERVICAL SPINE  Findings: Lung apices clear.  Maintained craniocervical relationship.  No dens fracture.  Maintained vertebral body height and alignment.  Mild C5-6 degenerative disc disease.  Paravertebral soft tissues within normal limits.  IMPRESSION: Mild C5-6 degenerative disc disease.  No acute osseous finding of the cervical spine.   Original Report Authenticated By: Jearld Lesch, M.D.   Ct Cervical Spine Wo Contrast  08/30/2012   *RADIOLOGY REPORT*  Clinical Data:  Found down, neck pain  CT HEAD WITHOUT CONTRAST CT CERVICAL SPINE WITHOUT CONTRAST  Technique:  Multidetector CT imaging of the head and cervical spine was performed following the standard protocol without intravenous contrast.  Multiplanar CT image reconstructions of the cervical spine were also generated.  Comparison:  02/07/2003 c-spine radiograph  CT HEAD  Findings: Periventricular and subcortical white matter hypodensities are most in keeping with chronic microangiopathic change. There is no evidence for acute hemorrhage, hydrocephalus, mass lesion, or abnormal extra-axial fluid collection.  No definite CT evidence for acute infarction.  The visualized paranasal sinuses and mastoid air cells are predominately clear.  No displaced calvarial fracture.  IMPRESSION: White matter changes as above.  No CT evidence of acute intracranial abnormality.  CT CERVICAL SPINE  Findings: Lung apices clear.  Maintained craniocervical relationship.  No  dens fracture.  Maintained vertebral body height and alignment.  Mild C5-6 degenerative disc disease.  Paravertebral soft tissues within normal limits.  IMPRESSION: Mild C5-6 degenerative disc disease.  No acute osseous finding of the cervical spine.   Original Report Authenticated By: Jearld Lesch, M.D.     Assessment/Plan: 62 YO female presenting to hospital in DKA and requiring insulin drip.  While in ED she was noted to have multiple episodes of mumbling associated with head turning.  EEG was obtained capturing one spell which showed no epileptiform activity on EEG recording.  These spells appear to be nonepileptic in etiology.  Patient does admit to having significant situational stresses, which could be contributing to her symptomatology.  Recommend: 1) Would not initiate AED at this time.  2)  Consider an antianxiety agent such as Ativan Xanax and low-dose. 3) MRI of the brain without contrast to rule out remote possibility of small vessel acute cortical stroke.  We will see this patient in followup.  Felicie Morn PA-C [facility] [phone]  08/30/2012, 10:54 AM  I personally participate in this patient's evaluation and management including formulating clinical impression and management recommendations.  Venetia Maxon M.D. [facility] [phone]

## 2012-08-30 NOTE — H&P (Signed)
Date: 08/30/2012               Patient Name:  Destiny Russell MRN: [id number]  DOB: 05/17/1950 Age / Sex: 62 y.o., female   PCP: Otis Brace, MD         Medical Service: Internal Medicine Teaching Service         Attending Physician: Dr. Debe Coder    First Contact: Dr. Glendell Docker Pager: [phone]  Second Contact: Dr. Dierdre Searles Pager: [phone]       After Hours (After 5p/  First Contact Pager: [phone]  weekends / holidays): Second Contact Pager: [phone]   Chief Complaint: Abnormal involuntary movements  History of Present Illness:  The patient is a 62 yo woman, history of DM, HTN, presenting with abnormal movements.  Starting on the morning of admission at 12:30 am, the patient began experiencing episodes of turning her head to the left, smacking her lips, and making a mumbling noise.  Episodes would last about 30 seconds, then resolve spontaneously, though with confusion and decreased responsiveness following these episodes for about 5 minutes.  During one of these episodes, the patient fell, but was able to stand and walk after the episode resolved.  Since coming to the ED, she has also experienced bilateral face twitching with these episodes.  She experienced no loss of bowel/bladder, no tongue biting (though no teeth).  She has no history of seizure.  She noted no focal weakness, numbness, or tingling.  She has experienced several of these episodes since presenting to the ED, including one during my examination of her.   The patient also has a history of DM2, with last A1C = 9.5 last year.  The patient states that for the last several weeks she has been drinking more than usual and going to the bathroom more often than usual, with occasional blurry vision.  She has been taking 75/25 insulin, 40 units every morning.  Meds: Current Facility-Administered Medications  Medication Dose Route Frequency Provider Last Rate Last Dose  . 0.9 %  sodium chloride infusion  1,000 mL Intravenous  Continuous Gerhard Munch, MD 125 mL/hr at 08/30/12 0546 1,000 mL at 08/30/12 0546  . potassium chloride SA (K-DUR,KLOR-CON) CR tablet 40 mEq  40 mEq Oral Once Linward Headland, MD      . sodium chloride 0.9 % bolus 1,000 mL  1,000 mL Intravenous Once Linward Headland, MD       Current Outpatient Prescriptions  Medication Sig Dispense Refill  . aspirin EC 81 MG tablet Take 81 mg by mouth daily.      Marland Kitchen atenolol (TENORMIN) 100 MG tablet Take 1 tablet (100 mg total) by mouth daily.  90 tablet  3  . insulin lispro protamine-lispro (HUMALOG 75/25) (75-25) 100 UNIT/ML SUSP injection Inject 40 Units into the skin daily with breakfast.        Allergies: Allergies as of 08/30/2012 - Review Complete 08/30/2012  Allergen Reaction Noted  . Morphine     Past Medical History  Diagnosis Date  . Parkinson's disease   . Diabetes mellitus without complication   . Hypertension    History reviewed. No pertinent past surgical history. No family history on file. History   Social History  . Marital Status: Married    Spouse Name: N/A    Number of Children: N/A  . Years of Education: N/A   Occupational History  . Not on file.   Social History Main Topics  . Smoking status: Never Smoker   .  Smokeless tobacco: Not on file  . Alcohol Use: No  . Drug Use: No  . Sexual Activity: Not on file   Other Topics Concern  . Not on file   Social History Narrative  . No narrative on file    Review of Systems: General: no fevers, chills, changes in weight, changes in appetite Skin: no rash HEENT: no hearing changes, sore throat Pulm: no dyspnea, coughing, wheezing CV: no chest pain, palpitations, shortness of breath Abd: no abdominal pain, nausea/vomiting, diarrhea/constipation GU: no dysuria, hematuria Ext: no arthralgias, myalgias Neuro: no weakness, numbness, or tingling  Physical Exam: Blood pressure 149/68, pulse 77, temperature 97.7 F (36.5 C), temperature source Oral, resp. rate 15, SpO2  98.00%. General: lying in bed, alert, though with poor memory of the last 24 hours HEENT: PERRL, EOMI, oropharynx non-erythematous, no tongue trauma, edentulous Neck: supple, no lymphadenopathy Lungs: clear to ascultation bilaterally, normal work of respiration, no wheezes, rales, ronchi Heart: regular rate and rhythm, no murmurs, gallops, or rubs Abdomen: soft, minimally tender to suprapubic palpation, non-distended, normal bowel sounds, no guarding or rebound tenderness Extremities: no cyanosis, clubbing, or edema Neurologic: alert & oriented X3, though slow to respond and requiring some prompting.  CN II-XII intact.  Strength 5/5 in bilateral upper and lower extremities.  Sensation grossly intact to light touch During my exam, while sitting in bed answering a question, the patient stopped, turned her head to the left, and exhibited lip smacking, bilateral facial twitching, and a continuous "humming" sound, lasting about 20-30 seconds, resolving spontaneously, though following the episode the patient was unable to speak to answer further questions for about 5 minutes.  Lab results: Basic Metabolic Panel:  Recent Labs  16/10/96 0403  NA 133*  K 4.2  CL 94*  CO2 25  GLUCOSE 481*  BUN 16  CREATININE 0.75  CALCIUM 9.9   Liver Function Tests:  Recent Labs  08/30/12 0403  AST 22  ALT 23  ALKPHOS 106  BILITOT 0.7  PROT 8.0  ALBUMIN 4.1   CBC:  Recent Labs  08/30/12 0403  WBC 6.3  NEUTROABS 4.4  HGB 17.3*  HCT 47.3*  MCV 83.3  PLT 190   CBG:  Recent Labs  08/30/12 0314  GLUCAP 473*   Coagulation:  Recent Labs  08/30/12 0403  LABPROT 12.7  INR 0.97   Urinalysis:  Recent Labs  08/30/12 0510  COLORURINE STRAW*  LABSPEC 1.027  PHURINE 6.0  GLUCOSEU >1000*  HGBUR NEGATIVE  BILIRUBINUR NEGATIVE  KETONESUR 40*  PROTEINUR NEGATIVE  UROBILINOGEN 0.2  NITRITE NEGATIVE  LEUKOCYTESUR NEGATIVE   Imaging results:  Ct Head Wo Contrast  08/30/2012    *RADIOLOGY REPORT*  Clinical Data:  Found down, neck pain  CT HEAD WITHOUT CONTRAST CT CERVICAL SPINE WITHOUT CONTRAST  Technique:  Multidetector CT imaging of the head and cervical spine was performed following the standard protocol without intravenous contrast.  Multiplanar CT image reconstructions of the cervical spine were also generated.  Comparison:  02/07/2003 c-spine radiograph  CT HEAD  Findings: Periventricular and subcortical white matter hypodensities are most in keeping with chronic microangiopathic change. There is no evidence for acute hemorrhage, hydrocephalus, mass lesion, or abnormal extra-axial fluid collection.  No definite CT evidence for acute infarction.  The visualized paranasal sinuses and mastoid air cells are predominately clear.  No displaced calvarial fracture.  IMPRESSION: White matter changes as above.  No CT evidence of acute intracranial abnormality.  CT CERVICAL SPINE  Findings: Lung apices clear.  Maintained craniocervical relationship.  No dens fracture.  Maintained vertebral body height and alignment.  Mild C5-6 degenerative disc disease.  Paravertebral soft tissues within normal limits.  IMPRESSION: Mild C5-6 degenerative disc disease.  No acute osseous finding of the cervical spine.   Original Report Authenticated By: Jearld Lesch, M.D.   Ct Cervical Spine Wo Contrast  08/30/2012   *RADIOLOGY REPORT*  Clinical Data:  Found down, neck pain  CT HEAD WITHOUT CONTRAST CT CERVICAL SPINE WITHOUT CONTRAST  Technique:  Multidetector CT imaging of the head and cervical spine was performed following the standard protocol without intravenous contrast.  Multiplanar CT image reconstructions of the cervical spine were also generated.  Comparison:  02/07/2003 c-spine radiograph  CT HEAD  Findings: Periventricular and subcortical white matter hypodensities are most in keeping with chronic microangiopathic change. There is no evidence for acute hemorrhage, hydrocephalus, mass lesion, or  abnormal extra-axial fluid collection.  No definite CT evidence for acute infarction.  The visualized paranasal sinuses and mastoid air cells are predominately clear.  No displaced calvarial fracture.  IMPRESSION: White matter changes as above.  No CT evidence of acute intracranial abnormality.  CT CERVICAL SPINE  Findings: Lung apices clear.  Maintained craniocervical relationship.  No dens fracture.  Maintained vertebral body height and alignment.  Mild C5-6 degenerative disc disease.  Paravertebral soft tissues within normal limits.  IMPRESSION: Mild C5-6 degenerative disc disease.  No acute osseous finding of the cervical spine.   Original Report Authenticated By: Jearld Lesch, M.D.    Other results: EKG: NSR, normal axis, no ST abnormalities  Assessment & Plan by Problem: The patient is a 62 yo woman, history of DM, HTN, presenting with DKA and abnormal movements.  # DKA - the patient presents with an AG = 14, glucose = 481, and ketonuria, as well as symptoms of polyuria/polydipsia and blurred vision, consistent with a mild episode of DKA.  The etiology of her DKA is likely inappropriate insulin usage (only once/day).  UA shows no evidence of UTI.  Some of the patient's confusion may be attributable to DKA.  The patient received a 1 L bolus of NS in the ED. -will give another 1 L NS -start insulin drip per glucomander protocol -check BMET q4hrs, CBG q1hrs -NPO -check A1C -check CXR as part of infectious work-up for causes of DKA  # Abnormal involuntary movements - the patient has experienced multiple episodes of involuntary head movements, twitching, and lip smacking, followed by a period of altered level of consciousness.  No evidence for status epilepticus, as pt returns to her baseline mental status between episodes.  I'm concerned that these episodes may represent new-onset seizures with post-ictal confusion.  Differential includes confusion from DKA, though this may not explain her  involuntary movements.  CVA unlikely given lack of focal neuro deficits. -will consult Neurology for consideration of EEG, and for determination of whether AED's are indicated  # HTN - chronic, stable -continue atenolol  # ?Parkinson's disease - noted on patient's medical history, but pt is not on treatment for this, and had no cogwheeling on my exam.  I'm unclear whether this diagnosis has been confirmed.  Dispo: Disposition is deferred at this time, awaiting improvement of current medical problems. Anticipated discharge in approximately 2-3 day(s).   The patient does have a current PCP (Otis Brace, MD) and does not need an [facility] hospital follow-up appointment after discharge.  Signed: Linward Headland, MD 08/30/2012, 7:51 AM    Addendum 9:29  am: The patient's repeat BMET shows that her AG has closed to 12 after just IV fluids.  Insulin drip has not yet been started.  Will d/c the insulin drip, and start the patient on just NPH and SSI to correct hyperglycemia.  Neurology has been consulted for possible seizure-like movements.  I've ordered an EEG, and spoken with the EEG lab, requesting that the patient be seen as early in the day as possible.

## 2012-08-30 NOTE — ED Notes (Signed)
Pt able to verbally answer questions at this time. Asked what month it was, pt responded "August."

## 2012-08-30 NOTE — ED Notes (Signed)
Pt states she feels better.

## 2012-08-30 NOTE — Procedures (Signed)
ELECTROENCEPHALOGRAM REPORT   Patient: Destiny Russell       Room #: 1O10 EEG No. ID: [id number] Age: 62 y.o.        Sex: female Referring Physician: Paya Report Date:  08/30/2012        Interpreting Physician: Aline Brochure  History: Destiny Russell is an 62 y.o. female with a history of diabetes mellitus and hypertension who's been experiencing recurrent spells of head turning to the left with associated lip smacking and mumbling type localization.  Indications for study:  Rule out new onset focal seizure disorder.  Technique: This is an 18 channel routine scalp EEG performed at the bedside with bipolar and monopolar montages arranged in accordance to the international 10/20 system of electrode placement.   Description: This EEG recording was performed during wakefulness. Background activity consisted of 9 Hz symmetrical alpha rhythm which attenuates well with eye opening. Photic stimulation produced a symmetrical occipital driving response. Hyperventilation was not performed. Patient experienced an episode of mumbling type localizations and apparent inattentiveness during the EEG recording shortly after photic stimulation was completed. Muscle activity was noted with a rhythmic pattern. There was no appreciable change in the background cerebral activity, however.  Interpretation: This is a normal EEG recording during wakefulness. No evidence of an epileptic disorder was demonstrated, including no evidence of epileptic etiology for the above described spells experienced by this patient.   Venetia Maxon M.D. [facility] [phone]

## 2012-08-30 NOTE — ED Notes (Signed)
Pt can correctly answer date of birth and states "neck" when asked where it hurts. Pt able to perform stroke scale commands without difficulty, no ataxia noted. Pt states she is in the hospital when asked where she is.

## 2012-08-30 NOTE — ED Notes (Signed)
Pt had another episode of gazing combined with mouth clamping and mumbling, lasted about 40 seconds. Pt family at this time stated that she has been doing this about every 30 minutes all night.

## 2012-08-30 NOTE — ED Notes (Signed)
Upon assessing pt IV site, pt was answering questions normally. Pt stated she felt better but her neck still hurt a little. At 0605 pt gazed up and to the left, began mumbling her mouth and shaking her head. Upon talking to the patient and stating her name, about 40 seconds later pt gaze returned to normal. Pt O2 sats dropped momentarily to 87%. Pt told to deep breath and began to take a couple deep breaths. O2 sats increased to 98%. MD notified.

## 2012-08-30 NOTE — ED Notes (Signed)
MD at bedside updating family and answering questions.

## 2012-08-30 NOTE — ED Notes (Addendum)
PT. FOUND ON THE FLOOR AT HOME BY FAMILY THIS EVENING WITH CONFUSION , PT. POINTS TO BACK OF NECK WHEN NURSE ASK WHERE SHE IS IN PAIN , FAMILY STATED PT. HAS HISTORY OF DIABETES AND PARKINSONS DISEASE. NO ARM DRIFT , EQUAL GRIPS BUT APHASIC AT INITIAL ENCOUNTER. C- COLLAR APPLIED.

## 2012-08-30 NOTE — ED Provider Notes (Signed)
CSN: [id number]     Arrival date & time 08/30/12  0256 History   First MD Initiated Contact with Patient 08/30/12 0316     Chief Complaint  Patient presents with  . Fall   (Consider location/radiation/quality/duration/timing/severity/associated sxs/prior Treatment) HPI Patient presents with altered mental status.  History of present illness is per the patient's family.  The patient herself states that she has pain in her head and neck, but does not have weakness anywhere, nausea, confusion.  On my exam the patient is oriented x3, though slowly verbal with quiet speech, however she offers only brief answers, cannot describe today's events. According to the patient's family throughout the day there've been several periods of atypical behavior, including garbled speech, patient being found on the floor, patient with disorientation. No clear precipitant, and each of these episodes has not had a clear alleviating or exacerbating factor. The most recent episode involved in the patient being found on the floor by her husband.  Patient did not have evidence of trauma, and was able to arise, walk, talk. Past Medical History  Diagnosis Date  . Parkinson's disease   . Diabetes mellitus without complication   . Hypertension    History reviewed. No pertinent past surgical history. No family history on file. History  Substance Use Topics  . Smoking status: Never Smoker   . Smokeless tobacco: Not on file  . Alcohol Use: No   OB History   Grav Para Term Preterm Abortions TAB SAB Ect Mult Living                 Review of Systems  Constitutional:       Per HPI, otherwise negative  HENT:       Per HPI, otherwise negative  Respiratory:       Per HPI, otherwise negative  Cardiovascular:       Per HPI, otherwise negative  Gastrointestinal: Negative for vomiting.  Endocrine:       Negative aside from HPI  Genitourinary:       Neg aside from HPI   Musculoskeletal:       Per HPI, otherwise  negative  Skin: Negative.   Neurological: Negative for seizures, syncope and weakness.    Allergies  Morphine  Home Medications   Current Outpatient Rx  Name  Route  Sig  Dispense  Refill  . aspirin EC 81 MG tablet   Oral   Take 81 mg by mouth daily.         Marland Kitchen atenolol (TENORMIN) 100 MG tablet   Oral   Take 1 tablet (100 mg total) by mouth daily.   90 tablet   3   . insulin lispro protamine-lispro (HUMALOG 75/25) (75-25) 100 UNIT/ML SUSP injection   Subcutaneous   Inject 40 Units into the skin daily with breakfast.          BP 160/87  Pulse 85  Temp(Src) 97.7 F (36.5 C) (Oral)  Resp 18  SpO2 96% Physical Exam  Nursing note and vitals reviewed. Constitutional: She is oriented to person, place, and time. She appears well-developed and well-nourished. No distress.  Elderly female with cervical collar in place.  HENT:  Head: Normocephalic and atraumatic.  Eyes: Conjunctivae and EOM are normal.  Cardiovascular: Normal rate and regular rhythm.   Pulmonary/Chest: Effort normal and breath sounds normal. No stridor. No respiratory distress.  Abdominal: She exhibits no distension.  Musculoskeletal: She exhibits no edema.  Neurological: She is alert and oriented to person, place,  and time. No cranial nerve deficit. She exhibits normal muscle tone. Coordination normal.  Skin: Skin is warm and dry.  Psychiatric: She has a normal mood and affect.    ED Course  Procedures (including critical care time) Labs Review Labs Reviewed  GLUCOSE, CAPILLARY - Abnormal; Notable for the following:    Glucose-Capillary 473 (*)    All other components within normal limits  COMPREHENSIVE METABOLIC PANEL  LACTIC ACID, PLASMA  PROTIME-INR  URINALYSIS, ROUTINE W REFLEX MICROSCOPIC  CBC WITH DIFFERENTIAL   Imaging Review No results found. Update: After the initial interview, the patient had another episode of atypical behavior, with garbled speech, staring in space.  This was a  brief, resolved spontaneously.  Cardiac monitor 80 sinus rhythm normal Pulse oximetry 100% room-air normal EKG is sinus rhythm, rate 84, no notable changes, abnormal    MDM  No diagnosis found. Patient presents after a series of episodes of atypical behavior and or disorientation.  In general the patient is in no distress, is speaking clearly, oriented.  However, given these new episodes, she required admission for further evaluation and management. There is initial suspicion for seizure versus TIA.  Patient is hyperglycemic, though this would be an unusual manifestation of metabolic encephalopathy, as she has no anion gap, no other significant collateral abnormalities.  No clear evidence of infection either.    Gerhard Munch, MD 08/30/12 0630

## 2012-08-30 NOTE — Care Management Note (Signed)
    Page 1 of 1   08/30/2012     12:14:01 PM   CARE MANAGEMENT NOTE 08/30/2012  Patient:  Destiny Russell, Destiny Russell   Account Number:  [id number]  Date Initiated:  08/30/2012  Documentation initiated by:  Junius Creamer  Subjective/Objective Assessment:   adm w seizure, dka     Action/Plan:   lives w husband, pcp dr Otis Brace   Anticipated DC Date:     Anticipated DC Plan:        DC Planning Services  CM consult      Choice offered to / List presented to:             Status of service:   Medicare Important Message given?   (If response is "NO", the following Medicare IM given date fields will be blank) Date Medicare IM given:   Date Additional Medicare IM given:    Discharge Disposition:    Per UR Regulation:  Reviewed for med. necessity/level of care/duration of stay  If discussed at Long Length of Stay Meetings, dates discussed:    Comments:

## 2012-08-30 NOTE — Progress Notes (Signed)
EEG Completed; Results Pending  

## 2012-08-30 NOTE — ED Notes (Signed)
0316-Upon stroke assessment and MD eval pt was able to answer some questions and follow all commands. 0328-While at bedside, pt began looking off to the left gazing up and mumbling incoherent speech for 60 seconds. Pt family stated this is the sound and gaze she made earlier today around 3. Pt family stated after this happened earlier, pt returned to baseline and was talking on the phone normally. About 1 am pt family found her on the floor unable to speak or answer command questions.  0333-Since pt began mumbling at 0328 pt has not responded to verbal questions but is able to follow commands.

## 2012-08-30 NOTE — ED Notes (Signed)
MD removed CSpine collar at this time. Pt tolerated well.

## 2012-08-31 ENCOUNTER — Inpatient Hospital Stay (HOSPITAL_COMMUNITY): Payer: Medicaid Other

## 2012-08-31 DIAGNOSIS — R259 Unspecified abnormal involuntary movements: Secondary | ICD-10-CM

## 2012-08-31 DIAGNOSIS — E1010 Type 1 diabetes mellitus with ketoacidosis without coma: Principal | ICD-10-CM

## 2012-08-31 LAB — CBC
HCT: 43 % (ref 36.0–46.0)
Hemoglobin: 15.5 g/dL — ABNORMAL HIGH (ref 12.0–15.0)
MCH: 30.8 pg (ref 26.0–34.0)
MCHC: 36 g/dL (ref 30.0–36.0)
MCV: 85.5 fL (ref 78.0–100.0)
RBC: 5.03 MIL/uL (ref 3.87–5.11)

## 2012-08-31 LAB — BASIC METABOLIC PANEL
BUN: 13 mg/dL (ref 6–23)
CO2: 25 mEq/L (ref 19–32)
Calcium: 9 mg/dL (ref 8.4–10.5)
Chloride: 108 mEq/L (ref 96–112)
Creatinine, Ser: 0.72 mg/dL (ref 0.50–1.10)
GFR calc Af Amer: >90 mL/min (ref >90)
GFR calc non Af Amer: 89 mL/min — ABNORMAL LOW (ref >90)
Glucose, Bld: 120 mg/dL — ABNORMAL HIGH (ref 70–99)
Potassium: 3.9 mEq/L (ref 3.5–5.1)
Sodium: 141 mEq/L (ref 135–145)

## 2012-08-31 LAB — GLUCOSE, CAPILLARY
Glucose-Capillary: 103 mg/dL — ABNORMAL HIGH (ref 70–99)
Glucose-Capillary: 129 mg/dL — ABNORMAL HIGH (ref 70–99)

## 2012-08-31 MED ORDER — INSULIN LISPRO PROT & LISPRO (75-25 MIX) 100 UNIT/ML ~~LOC~~ SUSP: SUBCUTANEOUS | Status: DC

## 2012-08-31 MED ORDER — LIVING WELL WITH DIABETES BOOK
Freq: Once | Status: DC
  Filled 2012-08-31: qty 1

## 2012-08-31 MED ORDER — INSULIN NPH (HUMAN) (ISOPHANE) 100 UNIT/ML ~~LOC~~ SUSP
10.0000 [IU] | Freq: Once | SUBCUTANEOUS | Status: AC
  Administered 2012-08-31: 10 [IU] via SUBCUTANEOUS
  Filled 2012-08-31: qty 10

## 2012-08-31 NOTE — Discharge Summary (Signed)
Name: Destiny Russell MRN: [id number] DOB: 12-16-1950 62 y.o. PCP: Otis Brace, MD  Date of Admission: 08/30/2012  3:01 AM Date of Discharge: 08/31/2012 Attending Physician: Inez Catalina, MD  Discharge Diagnosis: 1.  DKA (diabetic ketoacidoses) 2. DIABETES MELLITUS, TYPE II, HX OF 3. Abnormal involuntary movement  Discharge Medications:   Medication List         aspirin EC 81 MG tablet  Take 81 mg by mouth daily.     insulin lispro protamine-lispro (75-25) 100 UNIT/ML Susp injection  Commonly known as:  HUMALOG 75/25  30 Units in the morning and 25 units in the evening        Disposition and follow-up:   [name] was discharged from [facility] in Good condition.  At the hospital follow up visit please address:  1.  Anxiety, Stress, uncontrolled DM  2.  Labs / imaging needed at time of follow-up: CBC, BMP  3.  Pending labs/ test needing follow-up: None  Follow-up Appointments:     Follow-up Information   Follow up with Lorretta Harp, MD On 09/07/2012. (10.00am)    Specialty:  Internal Medicine   Contact information:   [address] [place] [address] [phone]       Discharge Instructions: Discharge Orders   Future Appointments Provider Department Dept Phone   09/07/2012 10:00 AM Lorretta Harp, MD [facility] [phone]   Future Orders Complete By Expires   Call MD for:  As directed    Comments:     If your symptoms worsen.   Diet - low sodium heart healthy  As directed    Discharge instructions  As directed    Comments:     We were unable to find a cause for your episodes. We do not believe that you are having seizures or a stroke.   Increase activity slowly  As directed       Consultations:    Procedures Performed:  Dg Chest 2 View  08/30/2012   *RADIOLOGY REPORT*  Clinical Data: Diabetic ketoacidosis.  Infection evaluation.  CHEST - 2 VIEW  Comparison: Thoracic spine radiographs  02/07/2003  Findings: The heart, mediastinal, and hilar contours are within normal limits.  The lungs are normally expanded and clear.  No focal airspace disease, edema, pleural effusion, or pneumothorax. There are mild typical degenerative changes of the thoracic spine and acromioclavicular joints.  No acute osseous abnormalities identified.  The visualized upper abdomen is unremarkable.  IMPRESSION: No acute cardiopulmonary disease.   Original Report Authenticated By: Britta Mccreedy, M.D.   Ct Head Wo Contrast  08/30/2012   *RADIOLOGY REPORT*  Clinical Data:  Found down, neck pain  CT HEAD WITHOUT CONTRAST CT CERVICAL SPINE WITHOUT CONTRAST  Technique:  Multidetector CT imaging of the head and cervical spine was performed following the standard protocol without intravenous contrast.  Multiplanar CT image reconstructions of the cervical spine were also generated.  Comparison:  02/07/2003 c-spine radiograph  CT HEAD  Findings: Periventricular and subcortical white matter hypodensities are most in keeping with chronic microangiopathic change. There is no evidence for acute hemorrhage, hydrocephalus, mass lesion, or abnormal extra-axial fluid collection.  No definite CT evidence for acute infarction.  The visualized paranasal sinuses and mastoid air cells are predominately clear.  No displaced calvarial fracture.  IMPRESSION: White matter changes as above.  No CT evidence of acute intracranial abnormality.  CT CERVICAL SPINE  Findings: Lung apices clear.  Maintained craniocervical  relationship.  No dens fracture.  Maintained vertebral body height and alignment.  Mild C5-6 degenerative disc disease.  Paravertebral soft tissues within normal limits.  IMPRESSION: Mild C5-6 degenerative disc disease.  No acute osseous finding of the cervical spine.   Original Report Authenticated By: Jearld Lesch, M.D.   Ct Cervical Spine Wo Contrast  08/30/2012   *RADIOLOGY REPORT*  Clinical Data:  Found down, neck pain  CT HEAD  WITHOUT CONTRAST CT CERVICAL SPINE WITHOUT CONTRAST  Technique:  Multidetector CT imaging of the head and cervical spine was performed following the standard protocol without intravenous contrast.  Multiplanar CT image reconstructions of the cervical spine were also generated.  Comparison:  02/07/2003 c-spine radiograph  CT HEAD  Findings: Periventricular and subcortical white matter hypodensities are most in keeping with chronic microangiopathic change. There is no evidence for acute hemorrhage, hydrocephalus, mass lesion, or abnormal extra-axial fluid collection.  No definite CT evidence for acute infarction.  The visualized paranasal sinuses and mastoid air cells are predominately clear.  No displaced calvarial fracture.  IMPRESSION: White matter changes as above.  No CT evidence of acute intracranial abnormality.  CT CERVICAL SPINE  Findings: Lung apices clear.  Maintained craniocervical relationship.  No dens fracture.  Maintained vertebral body height and alignment.  Mild C5-6 degenerative disc disease.  Paravertebral soft tissues within normal limits.  IMPRESSION: Mild C5-6 degenerative disc disease.  No acute osseous finding of the cervical spine.   Original Report Authenticated By: Jearld Lesch, M.D.   Mr Brain Wo Contrast  08/31/2012   *RADIOLOGY REPORT*  Clinical Data: Altered mental status.  Stroke.  MRI HEAD WITHOUT CONTRAST  Technique:  Multiplanar, multiecho pulse sequences of the brain and surrounding structures were obtained according to standard protocol without intravenous contrast.  Comparison: CT head 08/30/2012  Findings: Cerebral volume is normal for age.  Ventricle size is normal.  Chronic microvascular ischemic changes in the white matter bilaterally.  Small chronic lacuna in the right medial thalamus. Mild chronic ischemia in the pons.  Negative for acute infarct.  Negative for hemorrhage or mass lesion.  Mild mucosal edema in the paranasal sinuses.  IMPRESSION: Chronic microvascular  ischemia.  No acute infarct or mass.   Original Report Authenticated By: Janeece Riggers, M.D.   Admission HPI:  The patient is a 62 yo woman, history of DM, HTN, presenting with abnormal movements. Starting on the morning of admission at 12:30 am, the patient began experiencing episodes of turning her head to the left, smacking her lips, and making a mumbling noise. Episodes would last about 30 seconds, then resolve spontaneously, though with confusion and decreased responsiveness following these episodes for about 5 minutes. During one of these episodes, the patient fell, but was able to stand and walk after the episode resolved. Since coming to the ED, she has also experienced bilateral face twitching with these episodes. She experienced no loss of bowel/bladder, no tongue biting (though no teeth). She has no history of seizure. She noted no focal weakness, numbness, or tingling. She has experienced several of these episodes since presenting to the ED, including one during my examination of her.  The patient also has a history of DM2, with last A1C = 9.5 last year. The patient states that for the last several weeks she has been drinking more than usual and going to the bathroom more often than usual, with occasional blurry vision. She has been taking 75/25 insulin, 40 units every morning.  Hospital Course by problem  list:   DKA (diabetic ketoacidoses)   DIABETES MELLITUS, TYPE II, HX OF   Abnormal involuntary movement   1. DKA  The patient preseneds with an AG of 14, glucose of 481, and ketonuria, as well as symptoms of polyuria/polydipsia and blurred vision, consistent with a mild episode of DKA. The etiology of her DKA is likely inappropriate insulin usage (only once/day). UA shows no evidence of UTI. Some of the patient's confusion may be attributable to DKA. The patient received a 1 L bolus of NS in the ED. She was admitted and plan to be started insulin drip which was later changed to SSI+ NPH as her  anion gap closed on rehydration. Her HgA1c was 14.5. Overnight her blood glucose level trended down to 100's. She is to be followed up for outpatient glucose monitoring and insulin adjustment.  2. Abnormal involuntary movements  The patient has experienced multiple episodes of involuntary head movements, twitching, and lip smacking, followed by a period of altered level of consciousness. There was no evidence for status epilepticus, as patient returns to her baseline mental status between episodes.Differential includes new-onset seizures with post-ictal confusion, confusion from DKA( though this may not explain her involuntary movements), CVA (unlikely given lack of focal neuro deficits). Neurology consulted and they performed EEG during which she had an "episode" which showed no epileptiform activity on EEG recording and hence they ruled out epileptic etiology. Neurology rmmended MRI to r/o possibility of small vessel acute cortical stroke. The MR did not show acute findings. The patients movements are likely 2/2 to anxiety and stress.  Discharge Vitals:   BP 122/62  Pulse 76  Temp(Src) 98.5 F (36.9 C) (Oral)  Resp 18  Ht 5\' 2"  (1.575 m)  Wt 177 lb 4 oz (80.4 kg)  BMI 32.41 kg/m2  SpO2 100%  Discharge Labs:  Results for orders placed during the hospital encounter of 08/30/12 (from the past 24 hour(s))  GLUCOSE, CAPILLARY     Status: Abnormal   Collection Time    08/30/12  3:59 PM      Result Value Range   Glucose-Capillary 125 (*) 70 - 99 mg/dL   Comment 1 Notify RN    GLUCOSE, CAPILLARY     Status: Abnormal   Collection Time    08/30/12  5:10 PM      Result Value Range   Glucose-Capillary 109 (*) 70 - 99 mg/dL  GLUCOSE, CAPILLARY     Status: Abnormal   Collection Time    08/30/12  8:10 PM      Result Value Range   Glucose-Capillary 132 (*) 70 - 99 mg/dL  GLUCOSE, CAPILLARY     Status: Abnormal   Collection Time    08/30/12 10:51 PM      Result Value Range   Glucose-Capillary  148 (*) 70 - 99 mg/dL  GLUCOSE, CAPILLARY     Status: Abnormal   Collection Time    08/31/12 12:02 AM      Result Value Range   Glucose-Capillary 129 (*) 70 - 99 mg/dL  GLUCOSE, CAPILLARY     Status: Abnormal   Collection Time    08/31/12  4:06 AM      Result Value Range   Glucose-Capillary 112 (*) 70 - 99 mg/dL  BASIC METABOLIC PANEL     Status: Abnormal   Collection Time    08/31/12  4:30 AM      Result Value Range   Sodium 141  135 - 145 mEq/L  Potassium 3.9  3.5 - 5.1 mEq/L   Chloride 108  96 - 112 mEq/L   CO2 25  19 - 32 mEq/L   Glucose, Bld 120 (*) 70 - 99 mg/dL   BUN 13  6 - 23 mg/dL   Creatinine, Ser 8.41  0.50 - 1.10 mg/dL   Calcium 9.0  8.4 - 32.4 mg/dL   GFR calc non Af Amer 89 (*) >90 mL/min   GFR calc Af Amer >90  >90 mL/min  CBC     Status: Abnormal   Collection Time    08/31/12  4:30 AM      Result Value Range   WBC 6.7  4.0 - 10.5 K/uL   RBC 5.03  3.87 - 5.11 MIL/uL   Hemoglobin 15.5 (*) 12.0 - 15.0 g/dL   HCT 40.1  02.7 - 25.3 %   MCV 85.5  78.0 - 100.0 fL   MCH 30.8  26.0 - 34.0 pg   MCHC 36.0  30.0 - 36.0 g/dL   RDW 66.4  40.3 - 47.4 %   Platelets 203  150 - 400 K/uL  GLUCOSE, CAPILLARY     Status: Abnormal   Collection Time    08/31/12  7:35 AM      Result Value Range   Glucose-Capillary 122 (*) 70 - 99 mg/dL  GLUCOSE, CAPILLARY     Status: Abnormal   Collection Time    08/31/12 11:48 AM      Result Value Range   Glucose-Capillary 103 (*) 70 - 99 mg/dL  GLUCOSE, CAPILLARY     Status: None   Collection Time    08/31/12  2:00 PM      Result Value Range   Glucose-Capillary 90  70 - 99 mg/dL    Signed: Pleas Koch, MD 08/31/2012, 3:20 PM   Time Spent on Discharge: 20 minutes Services Ordered on Discharge: None Equipment Ordered on Discharge: None

## 2012-08-31 NOTE — Evaluation (Signed)
Occupational Therapy Evaluation Patient Details Name: Destiny Russell MRN: [id number] DOB: [birth year] Today's Date: 08/31/2012 Time: 9562-1308 OT Time Calculation (min): 12 min  OT Assessment / Plan / Recommendation History of present illness Pt admit for questionable seizure activity.    Clinical Impression   Pt admitted with above. Pt slightly unsteady during functional mobility but no overt LOB. Pt will have 24/7 supervision assist at home and has no further acute OT needs. Will sign off.     OT Assessment  Patient does not need any further OT services    Follow Up Recommendations  No OT follow up;Supervision/Assistance - 24 hour    Barriers to Discharge      Equipment Recommendations  None recommended by OT    Recommendations for Other Services    Frequency       Precautions / Restrictions Precautions Precautions: Fall Restrictions Weight Bearing Restrictions: No   Pertinent Vitals/Pain See vitals    ADL  Grooming: Performed;Wash/dry hands;Supervision/safety Where Assessed - Grooming: Unsupported standing Upper Body Bathing: Simulated;Set up Where Assessed - Upper Body Bathing: Unsupported sitting Lower Body Bathing: Simulated;Min guard Where Assessed - Lower Body Bathing: Unsupported sit to stand Upper Body Dressing: Performed;Set up Where Assessed - Upper Body Dressing: Unsupported sitting Lower Body Dressing: Performed;Min guard Where Assessed - Lower Body Dressing: Unsupported sit to stand Toilet Transfer: Simulated;Min guard Toilet Transfer Method: Sit to Barista:  (bed) Equipment Used: Gait belt Transfers/Ambulation Related to ADLs: Min guard without device. Occasionally unsteady but no overt LOB.  ADL Comments: Min guard for standing components of ADLs for safety. Pt will have 24/7 supervision/assist at home.    OT Diagnosis:    OT Problem List:   OT Treatment Interventions:     OT Goals(Current goals can be found in the care  plan section) Acute Rehab OT Goals Patient Stated Goal: to go home  Visit Information  Last OT Received On: 08/31/12 Assistance Needed: +1 History of Present Illness: Pt admit for questionable seizure activity.        Prior Functioning     Home Living Family/patient expects to be discharged to:: Private residence Living Arrangements: Spouse/significant other;Children Available Help at Discharge: Family;Available 24 hours/day Type of Home: Mobile home Home Access: Stairs to enter Entrance Stairs-Number of Steps: 1 +1 Entrance Stairs-Rails: Can reach both Home Layout: One level Home Equipment: Cane - single point Prior Function Level of Independence: Independent Communication Communication: No difficulties Dominant Hand: Right         Vision/Perception Vision - History Patient Visual Report: No change from baseline   Cognition  Cognition Arousal/Alertness: Awake/alert Behavior During Therapy: WFL for tasks assessed/performed Overall Cognitive Status: Within Functional Limits for tasks assessed    Extremity/Trunk Assessment Upper Extremity Assessment Upper Extremity Assessment: Overall WFL for tasks assessed Lower Extremity Assessment Lower Extremity Assessment: Generalized weakness     Mobility Bed Mobility Bed Mobility: Rolling Right;Right Sidelying to Sit;Sitting - Scoot to Edge of Bed Rolling Right: 4: Min guard Right Sidelying to Sit: 4: Min guard;HOB flat Sitting - Scoot to [place] of Bed: 4: Min guard Transfers Transfers: Sit to Stand;Stand to Sit Sit to Stand: 4: Min guard;With upper extremity assist;From bed Stand to Sit: 4: Min guard;With upper extremity assist;To bed Details for Transfer Assistance: cues for hand placement     Exercise     Balance Standardized Balance Assessment Standardized Balance Assessment: Dynamic Gait Index Dynamic Gait Index Level Surface: Normal Change in Gait Speed: Normal Gait  with Horizontal Head Turns: Normal Gait  with Vertical Head Turns: Normal Gait and Pivot Turn: Normal Step Over Obstacle: Mild Impairment Step Around Obstacles: Normal Steps: Mild Impairment Total Score: 22 High Level Balance High Level Balance Comments: 22/24 on DGI.  Slight risk of falls.  Informed pt to use cane prn at d/c for safety especially outdoors.    End of Session OT - End of Session Equipment Utilized During Treatment: Gait belt Activity Tolerance: Patient tolerated treatment well Patient left: in bed;with call bell/phone within reach;with family/visitor present Nurse Communication: Mobility status  GO    08/31/2012 Cipriano Mile OTR/L Pager [phone] Office [phone]  Cipriano Mile 08/31/2012, 2:18 PM

## 2012-08-31 NOTE — Progress Notes (Addendum)
Inpatient Diabetes Program Recommendations  AACE/ADA: New Consensus Statement on Inpatient Glycemic Control (2013)  Target Ranges:  Prepandial:   less than 140 mg/dL      Peak postprandial:   less than 180 mg/dL (1-2 hours)      Critically ill patients:  140 - 180 mg/dL     Results for Renault, [place] M (MRN [id number]) as of 08/31/2012 15:43  Ref. Range 08/31/2012 00:02 08/31/2012 04:06 08/31/2012 07:35 08/31/2012 11:48 08/31/2012 14:00  Glucose-Capillary Latest Range: 70-99 mg/dL 409 (H) 811 (H) 914 (H) 103 (H) 90    Results for Mesick, [place] M (MRN [id number]) as of 08/31/2012 15:43  Ref. Range 08/30/2012 08:12  Hemoglobin A1C Latest Range: <5.7 % 14.5 (H)    **Patient for d/c home today.  Plan is to send patient home on Humalog 75/25 insulin- 30 units in the AM with breakfast and 25 units in the PM with supper.  **Noted A1c extremely elevated above goal.  Spoke with patient about her A1c.  Gave patient a copy of her A1c results and reminded her that her goal A1c is 7% or less per ADA standards.  Reviewed change in patient's home insulin regimen with her and asked patient to please check her CBGs at least bid at home (before breakfast and supper).  Patient stated she does not have a CBG meter.  Instructed patient to purchase a CBG meter OTC at [facility].  Reli-on Prime blood glucose meter is $16 and 50 count box of strips is $9.  Asked patient to please keep a written record of her blood sugars and to take her blood glucose log with her to all her MD appointments.  Noted patient has an appointment with Internal Medicine clinic on 09/03.  **I asked patient if she had any specific questions related to her DM care.  Patient stated she did not.      Will follow. Ambrose Finland RN, MSN, CDE Diabetes Coordinator Inpatient Diabetes Program [phone]

## 2012-08-31 NOTE — Progress Notes (Signed)
DC orders received.  Patient stable with no S/S of distress.  Medication and discharge information reviewed with patient and patient's family.  Patient DC home with family. Juron Vorhees Marie  

## 2012-08-31 NOTE — Progress Notes (Signed)
Physical Therapy Evaluation Patient Details Name: [place] M Lorio MRN: [id number] DOB: 14-Apr-1950 Today's Date: 08/31/2012 Time: 6578-4696 PT Time Calculation (min): 13 min  PT Assessment / Plan / Recommendation History of Present Illness  Pt admit for questionable seizure activity.   Clinical Impression  Pt admitted with ?seizures. Pt currently with functional limitations due to the deficits listed below (see PT Problem List). Will need HHPT safety eval.   Pt will benefit from skilled PT to increase their independence and safety with mobility to allow discharge to the venue listed below.     PT Assessment  Patient needs continued PT services    Follow Up Recommendations  Home health PT;Supervision/Assistance - 24 hour                Equipment Recommendations  None recommended by PT         Frequency Min 3X/week    Precautions / Restrictions Precautions Precautions: Fall Restrictions Weight Bearing Restrictions: No   Pertinent Vitals/Pain VSS, no pain      Mobility  Bed Mobility Bed Mobility: Rolling Right;Right Sidelying to Sit;Sitting - Scoot to Edge of Bed Rolling Right: 4: Min guard Right Sidelying to Sit: 4: Min guard;HOB flat Sitting - Scoot to [place] of Bed: 4: Min guard Transfers Transfers: Sit to Stand;Stand to Sit Sit to Stand: 4: Min guard;With upper extremity assist;From bed Stand to Sit: 4: Min guard;With upper extremity assist;To bed Details for Transfer Assistance: cues for hand placement Ambulation/Gait Ambulation/Gait Assistance: 4: Min guard Ambulation Distance (Feet): 300 Feet Assistive device: None Ambulation/Gait Assistance Details: Pt without LOB.  Occasional staggering.   Gait Pattern: Step-through pattern Stairs: Yes Stairs Assistance: 4: Min guard Stair Management Technique: Forwards;One rail Left Number of Stairs: 3 Wheelchair Mobility Wheelchair Mobility: No         PT Diagnosis: Generalized weakness  PT Problem List: Decreased  activity tolerance;Decreased balance;Decreased mobility;Decreased knowledge of use of DME;Decreased safety awareness PT Treatment Interventions: DME instruction;Gait training;Functional mobility training;Therapeutic activities;Therapeutic exercise;Balance training;Patient/family education;Stair training     PT Goals(Current goals can be found in the care plan section) Acute Rehab PT Goals Patient Stated Goal: to go home PT Goal Formulation: With patient Time For Goal Achievement: 09/14/12 Potential to Achieve Goals: Good  Visit Information  Last PT Received On: 08/31/12 Assistance Needed: +1 PT/OT Co-Evaluation/Treatment: Yes History of Present Illness: Pt admit for questionable seizure activity.        Prior Functioning  Home Living Family/patient expects to be discharged to:: Private residence Living Arrangements: Spouse/significant other;Children Available Help at Discharge: Family;Available 24 hours/day Type of Home: Mobile home Home Access: Stairs to enter Entrance Stairs-Number of Steps: 1 +1 Entrance Stairs-Rails: Can reach both Home Layout: One level Home Equipment: Cane - single point (walk in shower) Prior Function Level of Independence: Independent Communication Communication: No difficulties Dominant Hand: Right    Cognition  Cognition Arousal/Alertness: Awake/alert Behavior During Therapy: WFL for tasks assessed/performed Overall Cognitive Status: Within Functional Limits for tasks assessed    Extremity/Trunk Assessment Upper Extremity Assessment Upper Extremity Assessment: Defer to OT evaluation Lower Extremity Assessment Lower Extremity Assessment: Generalized weakness   Balance Standardized Balance Assessment Standardized Balance Assessment: Dynamic Gait Index Dynamic Gait Index Level Surface: Normal Change in Gait Speed: Normal Gait with Horizontal Head Turns: Normal Gait with Vertical Head Turns: Normal Gait and Pivot Turn: Normal Step Over  Obstacle: Mild Impairment Step Around Obstacles: Normal Steps: Mild Impairment Total Score: 22 High Level Balance High Level Balance Comments:  22/24 on DGI.  Slight risk of falls.  Informed pt to use cane prn at d/c for safety especially outdoors.   End of Session PT - End of Session Equipment Utilized During Treatment: Gait belt Activity Tolerance: Patient tolerated treatment well Patient left: in bed;with call bell/phone within reach;with family/visitor present Nurse Communication: Mobility status       INGOLD,Brennon Otterness 08/31/2012, 1:09 PM [facility] [phone] (pager)

## 2012-08-31 NOTE — Evaluation (Signed)
Clinical/Bedside Swallow Evaluation Patient Details  Name: Destiny Russell MRN: [id number] Date of Birth: 1950/10/12  Today's Date: 08/31/2012 Time: 1208-1200 SLP Time Calculation (min): 1432 min  Past Medical History:  Past Medical History  Diagnosis Date  . Parkinson's disease   . Diabetes mellitus without complication   . Hypertension   . Shortness of breath   . GERD (gastroesophageal reflux disease)    Past Surgical History:  Past Surgical History  Procedure Laterality Date  . Cesarean section    . Cardiac catheterization     HPI:  Destiny Russell is an 62 y.o. female who was noted on the morning of admission to have episodes of turning her head to the left, smacking her lips, and making a mumbling noise. Per chart, Episodes would last about 30 seconds, then resolve spontaneously, though with confusion and decreased responsiveness following these episodes for about 5 minutes. During one of these episodes, the patient fell, but was able to stand and walk after the episode resolved. Per report from resident, the patient continued to have episodes of head turning and lip smacking once per hour with complete resolution and return to baseline in between episodes. On consultation patient was hooked up to EEG.  Per tech she had a spell just before EEG started.  After photic stimulation I walked over to patient and asked how she was--she started to mumble continuously --there was no arm or leg movement, no eye deviation and no head turing.  This lasted for about 3 minutes.  During event patient withdrew to noxious stimulation in bilateral hand and withdrew from plantar stimulation. Post event patient was able to show me how many fingers I was holding up, follow verbal commands and when asked if she is under stress at home she verbally answered yes.  She would not answer any further questions. Of not: her BP did become elevated after event to 193/98.   Assessment / Plan /  Recommendation Clinical Impression  Pt. appears to have normal swallow function, with timely swallow initiation, good laryngeal elevation palpated, and voice clear after swallows, even after large consecutive swallows of thin liquids with a straw.  Pt. chewed and swallowed peanut butter crackers, even without her teeth (reports she usually eats without her dentures, and can eat most anything).  Pt. c/o of a "scratchy throat" but feels this is due to allergies. One small cough was noted after pt. reported this, but did not appear to be related to swallowing.    Aspiration Risk  Mild    Diet Recommendation Dysphagia 3 (Mechanical Soft);Thin liquid   Liquid Administration via: Cup;Straw Medication Administration: Whole meds with liquid Supervision: Patient able to self feed Compensations: Slow rate;Small sips/bites Postural Changes and/or Swallow Maneuvers: Seated upright 90 degrees    Other  Recommendations Oral Care Recommendations: Oral care BID Other Recommendations: Clarify dietary restrictions   Follow Up Recommendations  None    Frequency and Duration        Pertinent Vitals/Pain n/a    SLP Swallow Goals     Swallow Study Prior Functional Status  Type of Home: Mobile home Available Help at Discharge: Family;Available 24 hours/day    General HPI: Destiny Russell is an 62 y.o. female who was noted on the morning of admission to have episodes of turning her head to the left, smacking her lips, and making a mumbling noise. Per chart, Episodes would last about 30 seconds, then resolve spontaneously, though with confusion and decreased responsiveness following these  episodes for about 5 minutes. During one of these episodes, the patient fell, but was able to stand and walk after the episode resolved. Per report from resident, the patient continued to have episodes of head turning and lip smacking once per hour with complete resolution and return to baseline in between episodes. On  consultation patient was hooked up to EEG.  Per tech she had a spell just before EEG started.  After photic stimulation I walked over to patient and asked how she was--she started to mumble continuously --there was no arm or leg movement, no eye deviation and no head turing.  This lasted for about 3 minutes.  During event patient withdrew to noxious stimulation in bilateral hand and withdrew from plantar stimulation. Post event patient was able to show me how many fingers I was holding up, follow verbal commands and when asked if she is under stress at home she verbally answered yes.  She would not answer any further questions. Of not: her BP did become elevated after event to 193/98. Type of Study: Bedside swallow evaluation Previous Swallow Assessment: none Diet Prior to this Study: NPO Temperature Spikes Noted: No Respiratory Status: Room air History of Recent Intubation: No Behavior/Cognition: Alert;Cooperative;Pleasant mood Oral Cavity - Dentition: Edentulous;Dentures, not available Self-Feeding Abilities: Able to feed self Patient Positioning: Upright in bed Baseline Vocal Quality: Clear Volitional Cough: Strong Volitional Swallow: Able to elicit    Oral/Motor/Sensory Function Overall Oral Motor/Sensory Function: Appears within functional limits for tasks assessed   Ice Chips     Thin Liquid Thin Liquid: Within functional limits Presentation: Cup;Straw    Nectar Thick     Honey Thick     Puree Puree: Within functional limits Presentation: Self Fed;Spoon   Solid   GO    Solid: Within functional limits Presentation: Self Daine Gravel, Basilia Stuckert T 08/31/2012,12:26 PM

## 2012-08-31 NOTE — Progress Notes (Signed)
Subjective:  NAE o/n. Wants to go home. No more episodes.  Objective: Vital signs in last 24 hours: Filed Vitals:   08/30/12 2000 08/30/12 2359 08/31/12 0400 08/31/12 0800  BP: 114/67 103/65 105/51 115/60  Pulse: 67 62 64 65  Temp: 98.4 F (36.9 C) 98 F (36.7 C) 98.1 F (36.7 C) 97.8 F (36.6 C)  TempSrc: Oral Oral Oral Oral  Resp: 16 16 16 18   Height:      Weight:      SpO2: 96% 94% 98% 97%   Weight change:   Intake/Output Summary (Last 24 hours) at 08/31/12 1337 Last data filed at 08/31/12 1200  Gross per 24 hour  Intake   1250 ml  Output    550 ml  Net    700 ml   Physical Exam  Constitutional: She is oriented to person, place, and time. She appears well-developed and well-nourished.  HENT:  Head: Normocephalic and atraumatic.  Mouth/Throat: Oropharynx is clear and moist. No oropharyngeal exudate.  Cardiovascular: Normal rate, regular rhythm, normal heart sounds and intact distal pulses.  Exam reveals no friction rub.   No murmur heard. Pulmonary/Chest: Effort normal and breath sounds normal. No respiratory distress. She has no wheezes. She has no rales.  Neurological: She is alert and oriented to person, place, and time. No cranial nerve deficit.  Psychiatric: She has a normal mood and affect. Her behavior is normal.     Lab Results: Basic Metabolic Panel:  Recent Labs Lab 08/30/12 1200 08/31/12 0430  NA 138 141  K 3.4* 3.9  CL 102 108  CO2 26 25  GLUCOSE 279* 120*  BUN 13 13  CREATININE 0.63 0.72  CALCIUM 9.2 9.0   Liver Function Tests:  Recent Labs Lab 08/30/12 0403  AST 22  ALT 23  ALKPHOS 106  BILITOT 0.7  PROT 8.0  ALBUMIN 4.1   CBC:  Recent Labs Lab 08/30/12 0403 08/31/12 0430  WBC 6.3 6.7  NEUTROABS 4.4  --   HGB 17.3* 15.5*  HCT 47.3* 43.0  MCV 83.3 85.5  PLT 190 203    CBG:  Recent Labs Lab 08/30/12 2010 08/30/12 2251 08/31/12 0002 08/31/12 0406 08/31/12 0735 08/31/12 1148  GLUCAP 132* 148* 129* 112* 122*  103*   Hemoglobin A1C:  Recent Labs Lab 08/30/12 0812  HGBA1C 14.5*   Coagulation:  Recent Labs Lab 08/30/12 0403  LABPROT 12.7  INR 0.97   Urinalysis:  Recent Labs Lab 08/30/12 0510  COLORURINE STRAW*  LABSPEC 1.027  PHURINE 6.0  GLUCOSEU >1000*  HGBUR NEGATIVE  BILIRUBINUR NEGATIVE  KETONESUR 40*  PROTEINUR NEGATIVE  UROBILINOGEN 0.2  NITRITE NEGATIVE  LEUKOCYTESUR NEGATIVE    Micro Results: Recent Results (from the past 240 hour(s))  MRSA PCR SCREENING     Status: None   Collection Time    08/30/12 10:39 AM      Result Value Range Status   MRSA by PCR NEGATIVE  NEGATIVE Final   Comment:            The GeneXpert MRSA Assay (FDA     approved for NASAL specimens     only), is one component of a     comprehensive MRSA colonization     surveillance program. It is not     intended to diagnose MRSA     infection nor to guide or     monitor treatment for     MRSA infections.   Studies/Results: Dg Chest 2 View  08/30/2012   *  RADIOLOGY REPORT*  Clinical Data: Diabetic ketoacidosis.  Infection evaluation.  CHEST - 2 VIEW  Comparison: Thoracic spine radiographs 02/07/2003  Findings: The heart, mediastinal, and hilar contours are within normal limits.  The lungs are normally expanded and clear.  No focal airspace disease, edema, pleural effusion, or pneumothorax. There are mild typical degenerative changes of the thoracic spine and acromioclavicular joints.  No acute osseous abnormalities identified.  The visualized upper abdomen is unremarkable.  IMPRESSION: No acute cardiopulmonary disease.   Original Report Authenticated By: Britta Mccreedy, M.D.   Ct Head Wo Contrast  08/30/2012   *RADIOLOGY REPORT*  Clinical Data:  Found down, neck pain  CT HEAD WITHOUT CONTRAST CT CERVICAL SPINE WITHOUT CONTRAST  Technique:  Multidetector CT imaging of the head and cervical spine was performed following the standard protocol without intravenous contrast.  Multiplanar CT image  reconstructions of the cervical spine were also generated.  Comparison:  02/07/2003 c-spine radiograph  CT HEAD  Findings: Periventricular and subcortical white matter hypodensities are most in keeping with chronic microangiopathic change. There is no evidence for acute hemorrhage, hydrocephalus, mass lesion, or abnormal extra-axial fluid collection.  No definite CT evidence for acute infarction.  The visualized paranasal sinuses and mastoid air cells are predominately clear.  No displaced calvarial fracture.  IMPRESSION: White matter changes as above.  No CT evidence of acute intracranial abnormality.  CT CERVICAL SPINE  Findings: Lung apices clear.  Maintained craniocervical relationship.  No dens fracture.  Maintained vertebral body height and alignment.  Mild C5-6 degenerative disc disease.  Paravertebral soft tissues within normal limits.  IMPRESSION: Mild C5-6 degenerative disc disease.  No acute osseous finding of the cervical spine.   Original Report Authenticated By: Jearld Lesch, M.D.   Ct Cervical Spine Wo Contrast  08/30/2012   *RADIOLOGY REPORT*  Clinical Data:  Found down, neck pain  CT HEAD WITHOUT CONTRAST CT CERVICAL SPINE WITHOUT CONTRAST  Technique:  Multidetector CT imaging of the head and cervical spine was performed following the standard protocol without intravenous contrast.  Multiplanar CT image reconstructions of the cervical spine were also generated.  Comparison:  02/07/2003 c-spine radiograph  CT HEAD  Findings: Periventricular and subcortical white matter hypodensities are most in keeping with chronic microangiopathic change. There is no evidence for acute hemorrhage, hydrocephalus, mass lesion, or abnormal extra-axial fluid collection.  No definite CT evidence for acute infarction.  The visualized paranasal sinuses and mastoid air cells are predominately clear.  No displaced calvarial fracture.  IMPRESSION: White matter changes as above.  No CT evidence of acute intracranial  abnormality.  CT CERVICAL SPINE  Findings: Lung apices clear.  Maintained craniocervical relationship.  No dens fracture.  Maintained vertebral body height and alignment.  Mild C5-6 degenerative disc disease.  Paravertebral soft tissues within normal limits.  IMPRESSION: Mild C5-6 degenerative disc disease.  No acute osseous finding of the cervical spine.   Original Report Authenticated By: Jearld Lesch, M.D.   Medications: I have reviewed the patient's current medications. Scheduled Meds: . aspirin EC  81 mg Oral Daily  . atenolol  100 mg Oral Daily  . enoxaparin (LOVENOX) injection  40 mg Subcutaneous Q24H  . insulin aspart  0-9 Units Subcutaneous Q4H  . insulin NPH  20 Units Subcutaneous QAC breakfast   And  . insulin NPH  10 Units Subcutaneous QHS   Continuous Infusions: . sodium chloride 125 mL/hr at 08/31/12 0851   PRN Meds:.acetaminophen, dextrose, ondansetron (ZOFRAN) IV Assessment/Plan: Principal Problem:  DKA (diabetic ketoacidoses) Active Problems:   DIABETES MELLITUS, TYPE II, HX OF   Abnormal involuntary movement  The patient is a 62 yo woman, history of DM, HTN, presenting with DKA and abnormal movements.   # DKA-resolved - the patient presents with an AG = 14, glucose = 481, and ketonuria, as well as symptoms of polyuria/polydipsia and blurred vision, consistent with a mild episode of DKA. The etiology of her DKA is likely inappropriate insulin usage (only once/day). UA shows no evidence of UTI. Some of the patient's confusion may be attributable to DKA. The patient received a 1 L bolus of NS in the ED.  - SSI plus home insulin  # Abnormal involuntary movements-pseudoseizures - the patient has experienced multiple episodes of involuntary head movements, twitching, and lip smacking, followed by a period of altered level of consciousness. No evidence for status epilepticus, as pt returns to her baseline mental status between episodes. I'm concerned that these episodes  may represent new-onset seizures with post-ictal confusion. Differential includes confusion from DKA, though this may not explain her involuntary movements. CVA unlikely given lack of focal neuro deficits.  - Neuro rec's that these movements are pseudoseizures. - F/U MR Brain  # HTN - chronic, stable  -continue atenolol   # ?Parkinson's disease - noted on patient's medical history, but pt is not on treatment for this, and had no cogwheeling on my exam. I'm unclear whether this diagnosis has been confirmed.   Dispo: Disposition is deferred at this time, awaiting improvement of current medical problems.  Anticipated discharge in approximately 1-2 day(s).   The patient does have a current PCP (Otis Brace, MD) and does need an [facility] hospital follow-up appointment after discharge.  The patient does not know have transportation limitations that hinder transportation to clinic appointments.  .Services Needed at time of discharge: Y = Yes, Blank = No PT:   OT:   RN:   Equipment:   Other:     LOS: 1 day   Pleas Koch, MD 08/31/2012, 1:37 PM

## 2012-08-31 NOTE — Progress Notes (Signed)
Subjective: Patient has had no further events overnight.  One event was captured during EEG and showed no correlate.  Patient on no anticonvulsants.    Objective: Current vital signs: BP 115/60  Pulse 65  Temp(Src) 97.8 F (36.6 C) (Oral)  Resp 18  Ht 5\' 2"  (1.575 m)  Wt 80.4 kg (177 lb 4 oz)  BMI 32.41 kg/m2  SpO2 97% Vital signs in last 24 hours: Temp:  [97.5 F (36.4 C)-98.8 F (37.1 C)] 97.8 F (36.6 C) (08/27 0800) Pulse Rate:  [62-87] 65 (08/27 0800) Resp:  [16-20] 18 (08/27 0800) BP: (103-164)/(51-109) 115/60 mmHg (08/27 0800) SpO2:  [94 %-100 %] 97 % (08/27 0800)  Intake/Output from previous day: 08/26 0701 - 08/27 0700 In: 1062.5 [I.V.:1062.5] Out: 600 [Urine:600] Intake/Output this shift:   Nutritional status: NPO  Neurologic Exam: Mental Status: Alert, oriented, thought content appropriate.  Speech fluent without evidence of aphasia.  Able to follow 3 step commands without difficulty. Cranial Nerves: II: Discs flat bilaterally; Visual fields grossly normal, pupils equal, round, reactive to light and accommodation III,IV, VI: ptosis not present, extra-ocular motions intact bilaterally V,VII: smile symmetric, facial light touch sensation normal bilaterally VIII: hearing normal bilaterally IX,X: gag reflex present XI: bilateral shoulder shrug XII: midline tongue extension Motor: Right : Upper extremity   5/5    Left:     Upper extremity   5/5  Lower extremity   5/5     Lower extremity   5/5 Tone and bulk:normal tone throughout; no atrophy noted Sensory: Pinprick and light touch intact throughout, bilaterally Deep Tendon Reflexes: 2+ and symmetric throughout Plantars: Right: downgoing   Left: downgoing   Lab Results: Basic Metabolic Panel:  Recent Labs Lab 08/30/12 0403 08/30/12 0812 08/30/12 1200 08/31/12 0430  NA 133* 138 138 141  K 4.2 4.3 3.4* 3.9  CL 94* 101 102 108  CO2 25 25 26 25   GLUCOSE 481* 420* 279* 120*  BUN 16 15 13 13    CREATININE 0.75 0.64 0.63 0.72  CALCIUM 9.9 9.2 9.2 9.0    Liver Function Tests:  Recent Labs Lab 08/30/12 0403  AST 22  ALT 23  ALKPHOS 106  BILITOT 0.7  PROT 8.0  ALBUMIN 4.1   No results found for this basename: LIPASE, AMYLASE,  in the last 168 hours No results found for this basename: AMMONIA,  in the last 168 hours  CBC:  Recent Labs Lab 08/30/12 0403 08/31/12 0430  WBC 6.3 6.7  NEUTROABS 4.4  --   HGB 17.3* 15.5*  HCT 47.3* 43.0  MCV 83.3 85.5  PLT 190 203    Cardiac Enzymes: No results found for this basename: CKTOTAL, CKMB, CKMBINDEX, TROPONINI,  in the last 168 hours  Lipid Panel: No results found for this basename: CHOL, TRIG, HDL, CHOLHDL, VLDL, LDLCALC,  in the last 168 hours  CBG:  Recent Labs Lab 08/30/12 2010 08/30/12 2251 08/31/12 0002 08/31/12 0406 08/31/12 0735  GLUCAP 132* 148* 129* 112* 122*    Microbiology: Results for orders placed during the hospital encounter of 08/30/12  MRSA PCR SCREENING     Status: None   Collection Time    08/30/12 10:39 AM      Result Value Range Status   MRSA by PCR NEGATIVE  NEGATIVE Final   Comment:            The GeneXpert MRSA Assay (FDA     approved for NASAL specimens     only), is one component of  a     comprehensive MRSA colonization     surveillance program. It is not     intended to diagnose MRSA     infection nor to guide or     monitor treatment for     MRSA infections.    Coagulation Studies:  Recent Labs  08/30/12 0403  LABPROT 12.7  INR 0.97    Imaging: Dg Chest 2 View  08/30/2012   *RADIOLOGY REPORT*  Clinical Data: Diabetic ketoacidosis.  Infection evaluation.  CHEST - 2 VIEW  Comparison: Thoracic spine radiographs 02/07/2003  Findings: The heart, mediastinal, and hilar contours are within normal limits.  The lungs are normally expanded and clear.  No focal airspace disease, edema, pleural effusion, or pneumothorax. There are mild typical degenerative changes of the  thoracic spine and acromioclavicular joints.  No acute osseous abnormalities identified.  The visualized upper abdomen is unremarkable.  IMPRESSION: No acute cardiopulmonary disease.   Original Report Authenticated By: Britta Mccreedy, M.D.   Ct Head Wo Contrast  08/30/2012   *RADIOLOGY REPORT*  Clinical Data:  Found down, neck pain  CT HEAD WITHOUT CONTRAST CT CERVICAL SPINE WITHOUT CONTRAST  Technique:  Multidetector CT imaging of the head and cervical spine was performed following the standard protocol without intravenous contrast.  Multiplanar CT image reconstructions of the cervical spine were also generated.  Comparison:  02/07/2003 c-spine radiograph  CT HEAD  Findings: Periventricular and subcortical white matter hypodensities are most in keeping with chronic microangiopathic change. There is no evidence for acute hemorrhage, hydrocephalus, mass lesion, or abnormal extra-axial fluid collection.  No definite CT evidence for acute infarction.  The visualized paranasal sinuses and mastoid air cells are predominately clear.  No displaced calvarial fracture.  IMPRESSION: White matter changes as above.  No CT evidence of acute intracranial abnormality.  CT CERVICAL SPINE  Findings: Lung apices clear.  Maintained craniocervical relationship.  No dens fracture.  Maintained vertebral body height and alignment.  Mild C5-6 degenerative disc disease.  Paravertebral soft tissues within normal limits.  IMPRESSION: Mild C5-6 degenerative disc disease.  No acute osseous finding of the cervical spine.   Original Report Authenticated By: Jearld Lesch, M.D.   Ct Cervical Spine Wo Contrast  08/30/2012   *RADIOLOGY REPORT*  Clinical Data:  Found down, neck pain  CT HEAD WITHOUT CONTRAST CT CERVICAL SPINE WITHOUT CONTRAST  Technique:  Multidetector CT imaging of the head and cervical spine was performed following the standard protocol without intravenous contrast.  Multiplanar CT image reconstructions of the cervical spine  were also generated.  Comparison:  02/07/2003 c-spine radiograph  CT HEAD  Findings: Periventricular and subcortical white matter hypodensities are most in keeping with chronic microangiopathic change. There is no evidence for acute hemorrhage, hydrocephalus, mass lesion, or abnormal extra-axial fluid collection.  No definite CT evidence for acute infarction.  The visualized paranasal sinuses and mastoid air cells are predominately clear.  No displaced calvarial fracture.  IMPRESSION: White matter changes as above.  No CT evidence of acute intracranial abnormality.  CT CERVICAL SPINE  Findings: Lung apices clear.  Maintained craniocervical relationship.  No dens fracture.  Maintained vertebral body height and alignment.  Mild C5-6 degenerative disc disease.  Paravertebral soft tissues within normal limits.  IMPRESSION: Mild C5-6 degenerative disc disease.  No acute osseous finding of the cervical spine.   Original Report Authenticated By: Jearld Lesch, M.D.    Medications:  I have reviewed the patient's current medications. Scheduled: . aspirin EC  81 mg Oral Daily  . atenolol  100 mg Oral Daily  . enoxaparin (LOVENOX) injection  40 mg Subcutaneous Q24H  . insulin aspart  0-9 Units Subcutaneous Q4H  . insulin NPH  20 Units Subcutaneous QAC breakfast   And  . insulin NPH  10 Units Subcutaneous QHS    Assessment/Plan: Patient without further events.  Doubt seizure.  Would not treat with anticonvulsants at this time.  Head CT unremarkable.    Recommendations: 1.  No further neurologic testing at this time.  Will continue to follow with you.      LOS: 1 day   Thana Farr, MD [facility] [phone] 08/31/2012  10:15 AM

## 2012-08-31 NOTE — Care Management (Signed)
Case Manager spoke to pt and she lives with family. Pt will not be eligible for [facility] PT services at this d/t she does not have a qualifying diagnosis and no insurance. CM did call MD to make him aware. No further needs from CM at this time. Gala Lewandowsky, RN,BSN [phone]

## 2012-09-01 ENCOUNTER — Other Ambulatory Visit: Payer: Self-pay | Admitting: *Deleted

## 2012-09-01 DIAGNOSIS — I10 Essential (primary) hypertension: Secondary | ICD-10-CM

## 2012-09-01 MED ORDER — ATENOLOL 100 MG PO TABS: 100.0000 mg | ORAL_TABLET | Freq: Every day | ORAL | Status: DC

## 2012-09-01 NOTE — Telephone Encounter (Signed)
Son states pt was d/c from hospital yesterday.  Family asked for a refill on this med and was told they would have to call clinic. Pt is out of med now.  Will  you refill for her?

## 2012-09-02 NOTE — Discharge Summary (Signed)
I saw Ms. Destiny Russell on day of discharge and agree with the formulated plan.  Discharge took 35 minutes.

## 2012-09-07 ENCOUNTER — Ambulatory Visit (INDEPENDENT_AMBULATORY_CARE_PROVIDER_SITE_OTHER): Payer: Self-pay | Admitting: Internal Medicine

## 2012-09-07 ENCOUNTER — Encounter: Payer: Self-pay | Admitting: Internal Medicine

## 2012-09-07 VITALS — BP 130/70 | HR 57 | Temp 97.1°F | Wt 182.7 lb

## 2012-09-07 DIAGNOSIS — R259 Unspecified abnormal involuntary movements: Secondary | ICD-10-CM

## 2012-09-07 DIAGNOSIS — E1310 Other specified diabetes mellitus with ketoacidosis without coma: Secondary | ICD-10-CM

## 2012-09-07 DIAGNOSIS — Z862 Personal history of diseases of the blood and blood-forming organs and certain disorders involving the immune mechanism: Secondary | ICD-10-CM

## 2012-09-07 DIAGNOSIS — I10 Essential (primary) hypertension: Secondary | ICD-10-CM

## 2012-09-07 DIAGNOSIS — Z597 Insufficient social insurance and welfare support: Secondary | ICD-10-CM

## 2012-09-07 DIAGNOSIS — Z0000 Encounter for general adult medical examination without abnormal findings: Secondary | ICD-10-CM | POA: Insufficient documentation

## 2012-09-07 DIAGNOSIS — Z23 Encounter for immunization: Secondary | ICD-10-CM

## 2012-09-07 DIAGNOSIS — E1110 Type 2 diabetes mellitus with ketoacidosis without coma: Secondary | ICD-10-CM

## 2012-09-07 DIAGNOSIS — Z598 Other problems related to housing and economic circumstances: Secondary | ICD-10-CM

## 2012-09-07 DIAGNOSIS — Z1211 Encounter for screening for malignant neoplasm of colon: Secondary | ICD-10-CM

## 2012-09-07 DIAGNOSIS — E119 Type 2 diabetes mellitus without complications: Secondary | ICD-10-CM

## 2012-09-07 LAB — HM DIABETES EYE EXAM

## 2012-09-07 MED ORDER — INSULIN LISPRO PROT & LISPRO (75-25 MIX) 100 UNIT/ML ~~LOC~~ SUSP: SUBCUTANEOUS | Status: DC

## 2012-09-07 NOTE — Assessment & Plan Note (Signed)
She had negative MRI and EEG in her recent hospitalization. Patient had 2 very milder episodes of symptoms. It is most likely due to anxiety or stress. She did not have new episode since 8/28. Will continue to monitor. Patient was instructed to come to the hospital if she develops new episode. If she continues to have this problem, will consider to give her referral to neurology

## 2012-09-07 NOTE — Assessment & Plan Note (Signed)
Lab Results  Component Value Date   HGBA1C 14.5* 08/30/2012   HGBA1C 9.5 07/06/2011   HGBA1C 11.6 03/27/2011     Assessment: Diabetes control: poor control (HgbA1C >9%) Progress toward A1C goal:  deteriorated Comments:   Plan: Medications:  will increase her insulin 75/25 dosage to 32 in AM and 27 in evening.  Home glucose monitoring: Occasionally. Frequency:   Timing:   Instruction/counseling given: reminded to bring blood glucose meter & log to each visit Educational resources provided: brochure Self management tools provided: home glucose logbook Other plans: will increase her insulin 75/25 dosage to 32 in AM and 27 in evening.

## 2012-09-07 NOTE — Progress Notes (Signed)
Patient ID: Destiny Russell, female   DOB: 1950/10/27, 62 y.o.   MRN: [id number]  Subjective:   Patient ID: Destiny Russell female   DOB: 1950-01-16 62 y.o.   MRN: [id number]  CC:   Hospital followup visit.  HPI:  Destiny Russell is a 62 y.o.    with past medical history as outlined below, who presents for a hospital followup visit today.  Patient's was hospitalized from 8/26 to 8/27 because of mild DKA and abnormal involuntary movement. Patient comes back for a followup visit. She reports that she feels good. No specific complaints except for feeling tired today  1. DM-II: She was recently treated for mild DKA with AG of 14. Her A1c was 14.5 on 08/30/12. She was discharged on Humalog mixed insulin (75/25), she is taking 30 units in the morning and 25 units in the evening. Patient measured her blood sugar for few times. Her blood sugar level at 200 to 215 in the morning, and 200 to 250 in the evening. Sometimes she has blurry vision. She does not have polyuria or any symptoms of hypoglycemia.   2. Abnormal involuntary movements: patient had multiple episodes of involuntary head movements, twitching, and lip smacking, followed by a period of altered level of consciousness before her previous admission. Neurology consulted and they performed EEG during which she had an "episode" which showed no epileptiform activity on EEG recording and hence they ruled out epileptic etiology. Neurology recommended MRI to r/o possibility of small vessel acute cortical stroke. The MR did not show acute findings. The patients movements were considered to be likely 2/2 to anxiety and stress. After discharged from hospital, patient had 2 episodes of a similar, but much milder symptoms on 8/27 and 8/28. She had eye starring for about 5 minutes per her son. She did not have jerking movement or seizure-like activity, no incontinence, no palpitation. She did not pass out. She didn't have post ictal symptoms.  3. HTN: Patient  has been taking atenolol 100 mg daily for several years. Her HTN is 130/70 mmHg. Her HR is 57/min. patient does not have chest pain, shortness of breath, palpitation  ROS:  Denies fever, chills, headaches, cough, chest pain, SOB,  abdominal pain, diarrhea, constipation, dysuria, urgency, frequency, hematuria.    Past Medical History  Diagnosis Date  . Parkinson's disease   . Diabetes mellitus without complication   . Hypertension   . Shortness of breath   . GERD (gastroesophageal reflux disease)    Current Outpatient Prescriptions  Medication Sig Dispense Refill  . aspirin EC 81 MG tablet Take 81 mg by mouth daily.      Marland Kitchen atenolol (TENORMIN) 100 MG tablet Take 1 tablet (100 mg total) by mouth daily.  90 tablet  3  . insulin lispro protamine-lispro (HUMALOG 75/25) (75-25) 100 UNIT/ML SUSP injection 32 Units in the morning and 27 units in the evening  10 mL  0   No current facility-administered medications for this visit.   Family History  Problem Relation Age of Onset  . Hypertension Mother   . Hypertension Father    History   Social History  . Marital Status: Married    Spouse Name: N/A    Number of Children: N/A  . Years of Education: 9th   Social History Main Topics  . Smoking status: Never Smoker   . Smokeless tobacco: Current User    Types: Snuff, Chew  . Alcohol Use: No  . Drug Use: No  .  Sexual Activity: None   Other Topics Concern  . None   Social History Narrative  . None    Review of Systems: Full 14-point review of systems otherwise negative. See HPI.   Objective:  Physical Exam: Filed Vitals:   09/07/12 1005  BP: 130/70  Pulse: 57  Temp: 97.1 F (36.2 C)  TempSrc: Oral  Weight: 182 lb 11.2 oz (82.872 kg)  SpO2: 98%   General: No acute destress HEENT: PERRL, EOMI, oropharynx non-erythematous, no tongue trauma, edentulous Neck: supple, no lymphadenopathy Lungs: clear to ascultation bilaterally, normal work of respiration, no wheezes, rales,  ronchi Heart: regular rate and rhythm, no murmurs, gallops, or rubs Abdomen: soft, nontender, non-distended, normal bowel sounds, no guarding or rebound tenderness Extremities: no cyanosis, clubbing, or edema Neurologic: alert & oriented X3, though slow to respond and requiring some prompting.  CN II-XII intact.  Strength 5/5 in bilateral upper and lower extremities.  Sensation grossly intact to light touch.    Assessment & Plan:

## 2012-09-07 NOTE — Assessment & Plan Note (Signed)
Patient does not have insurance. Will give referral to our social worker for orange card application.

## 2012-09-07 NOTE — Patient Instructions (Signed)
1. Please increase your Humalog insulin (75/25) dosage to 32 units in the morning and 27 units in the evening.   2. Our social work will work with you for [employer], please do the paper work as soon as possible.  3. If you have new episode of lip smacking, jerking movement or seizure, please come the hospital immediatly.   4. If you have worsening of your symptoms or new symptoms arise, please call the clinic ([phone]), or go to the ER immediately if symptoms are severe.  You have done great job in taking all your medications. I appreciate it very much. Please continue doing that.

## 2012-09-07 NOTE — Assessment & Plan Note (Signed)
BP Readings from Last 3 Encounters:  09/07/12 130/70  08/31/12 122/62  07/06/11 126/73    Lab Results  Component Value Date   NA 141 08/31/2012   K 3.9 08/31/2012   CREATININE 0.72 08/31/2012    Assessment: Blood pressure control: controlled Progress toward BP goal:  at goal Comments:   Plan: Medications:  continue current medications Educational resources provided: brochure Self management tools provided: home blood pressure logbook Other plans: will continue atenolol 100 mg daily. Although her blood pressure is well controlled with this medication, patient may be benefited from a switch to or add lisinopril given her poorly controlled diabetes.

## 2012-09-07 NOTE — Assessment & Plan Note (Signed)
-   did foot and eye exam - gave flu shot and Tetanus shot  - give hemoccult card X 3.

## 2012-09-13 NOTE — Addendum Note (Signed)
Addended by: Hassan Buckler on: 09/13/2012 02:41 PM   Modules accepted: Orders

## 2012-09-14 NOTE — Progress Notes (Signed)
Case discussed with Dr. Niu at the time of the visit.  We reviewed the resident's history and exam and pertinent patient test results.  I agree with the assessment, diagnosis, and plan of care documented in the resident's note.    

## 2012-09-26 NOTE — Addendum Note (Signed)
Addended by: Bufford Spikes on: 09/26/2012 09:42 AM   Modules accepted: Orders

## 2012-10-07 ENCOUNTER — Ambulatory Visit (INDEPENDENT_AMBULATORY_CARE_PROVIDER_SITE_OTHER): Payer: No Typology Code available for payment source | Admitting: Internal Medicine

## 2012-10-07 ENCOUNTER — Encounter: Payer: Self-pay | Admitting: Internal Medicine

## 2012-10-07 ENCOUNTER — Ambulatory Visit: Payer: No Typology Code available for payment source

## 2012-10-07 VITALS — BP 193/94 | HR 66 | Temp 97.9°F | Ht 62.0 in | Wt 190.8 lb

## 2012-10-07 DIAGNOSIS — E785 Hyperlipidemia, unspecified: Secondary | ICD-10-CM

## 2012-10-07 DIAGNOSIS — J309 Allergic rhinitis, unspecified: Secondary | ICD-10-CM

## 2012-10-07 DIAGNOSIS — I10 Essential (primary) hypertension: Secondary | ICD-10-CM

## 2012-10-07 DIAGNOSIS — Z0000 Encounter for general adult medical examination without abnormal findings: Secondary | ICD-10-CM

## 2012-10-07 DIAGNOSIS — Z862 Personal history of diseases of the blood and blood-forming organs and certain disorders involving the immune mechanism: Secondary | ICD-10-CM

## 2012-10-07 LAB — LIPID PANEL
Cholesterol: 186 mg/dL (ref 0–200)
LDL Cholesterol: 116 mg/dL — ABNORMAL HIGH (ref 0–99)
Triglycerides: 50 mg/dL (ref <150)
VLDL: 10 mg/dL (ref 0–40)

## 2012-10-07 MED ORDER — INSULIN NPH ISOPHANE & REGULAR (70-30) 100 UNIT/ML ~~LOC~~ SUSP: SUBCUTANEOUS | Status: DC

## 2012-10-07 MED ORDER — ATENOLOL 100 MG PO TABS: 100.0000 mg | ORAL_TABLET | Freq: Every day | ORAL | Status: DC

## 2012-10-07 MED ORDER — LISINOPRIL 5 MG PO TABS: 5.0000 mg | ORAL_TABLET | Freq: Every day | ORAL | Status: DC

## 2012-10-07 MED ORDER — METFORMIN HCL 500 MG PO TABS: 500.0000 mg | ORAL_TABLET | Freq: Two times a day (BID) | ORAL | Status: DC

## 2012-10-07 MED ORDER — PNEUMOCOCCAL VAC POLYVALENT 25 MCG/0.5ML IJ INJ: 0.5000 mL | INJECTION | Freq: Once | INTRAMUSCULAR | Status: DC

## 2012-10-07 NOTE — Progress Notes (Signed)
  Subjective:    Patient ID: Destiny Russell, female    DOB: 24-Mar-1950, 62 y.o.   MRN: [id number]  CC: 1 month follow up.   HPI  Destiny Russell is a 62yo woman with PMH of HLD, HTN, DM type 2, CAD who presents for follow up.  Destiny Russell was last seen by Dr. Clyde Lundborg ~ 1 month ago for review of her chronic medical conditions.  At that visit, she was noted to have an A1C of 14.5 and her insulin dosing was changed.  She presents today for further follow up of this issue.    Destiny Russell reports today that she is taking her insulin as reported.  + blurry vision, polyuria, polydipsia.  Burning on the bottom of her feet, worse at night.  Some nausea with high blood sugars.  She is trying to keep to a low carb diet and increase her vegetable intake.  No exercise.   CBG review 116 - 289, majority > 200.   Further symptoms, feels short of breath with lying down, feels like her tongue is swollen sometimes.  Neck pain, since hospitalization, 6/10, worse with movement.  Further a runny nose, worse when outside.   Home blood pressure has been in the 120s systolic by report.  Currently elevated in clinic, with recheck also elevated.    No smoking.   Her medications were reviewed.   Review of Systems  Constitutional: Negative for fever, chills and fatigue.  HENT: Positive for neck pain. Negative for hearing loss.   Eyes: Positive for visual disturbance (blurry vision). Negative for pain.  Respiratory: Negative for cough and shortness of breath.   Cardiovascular: Negative for chest pain and leg swelling.  Gastrointestinal: Negative for diarrhea, constipation and abdominal distention.  Endocrine: Positive for polydipsia and polyuria.  Genitourinary: Positive for frequency. Negative for dysuria and difficulty urinating.  Musculoskeletal: Negative for back pain and gait problem.  Skin: Negative for rash and wound.       + calluses on bottoms of feet  Neurological: Negative for dizziness, weakness and  light-headedness.       Objective:   Physical Exam  Constitutional: She is oriented to person, place, and time. She appears well-developed and well-nourished. No distress.  HENT:  Head: Normocephalic and atraumatic.  Eyes: Conjunctivae are normal. Pupils are equal, round, and reactive to light. No scleral icterus.  Neck: Normal range of motion.  Cardiovascular: Normal rate, regular rhythm and normal heart sounds.   No murmur heard. Pulmonary/Chest: Effort normal and breath sounds normal. No respiratory distress. She has no wheezes.  Abdominal: Soft. Bowel sounds are normal.  Musculoskeletal: She exhibits no edema and no tenderness.  Lymphadenopathy:    She has no cervical adenopathy.  Neurological: She is alert and oriented to person, place, and time.  Monofilament exam done, somewhat decreased on large toe on the right.   Skin: Skin is warm and dry. No erythema.  Psychiatric: She has a normal mood and affect. Her behavior is normal.   Filed Vitals:   10/07/12 1215  BP: 193/94  Pulse: 66  Temp:    Labs today, MAU/Cr, Lipid panel       Assessment & Plan:  RTC in 2 weeks for follow up of HTN.

## 2012-10-07 NOTE — Assessment & Plan Note (Addendum)
Currently not well-controlled with BP 193/94 today's visit. Goal <140/90 based on [facility] recommendations. 1. Continue atenolol at current dosing of 100 mg PO qday. 2. Start Lisinopril 5 mg PO qday. This will also provide renal protective effects.  3. F/u in 2 weeks to check daily blood pressures and evaluate control.   Update by Dr. Criselda Peaches - -   Agree with documented plan.  Also requested that she take her blood pressure daily and document over next 2 weeks for more information.   BP Readings from Last 3 Encounters:  10/07/12 193/94  09/07/12 130/70  08/31/12 122/62    Lab Results  Component Value Date   NA 141 08/31/2012   K 3.9 08/31/2012   CREATININE 0.72 08/31/2012    Assessment: Blood pressure control: severely elevated Progress toward BP goal:  deteriorated Comments: This seems to be an outlier, as her other BPs in the system were only mildly elevated.   Plan: Medications:  continue current medications, add lisinopril 5mg  daily Educational resources provided: brochure Self management tools provided:   Other plans: Check blood pressure at home, see back in 2 weeks.

## 2012-10-07 NOTE — Assessment & Plan Note (Addendum)
Currently uncontrolled w/ glucose levels in 200s preprandial. Pt is also experiencing many symptoms consistent with diabetes - neuropathies, blurry vision, polydipsia, polyphagia, polyuria.  1. Change insulin to Novolog 70/30 and administer 34 IU in the morning and 30 IU at night. Pt received orange card today, which is the reason for switching to 70/30 from 75/25. 2. Start metformin 500 mg PO bid w/ meals. 3. F/u in 2 weeks to recheck glucose levels and control with medication changes.  Lab Results  Component Value Date   HGBA1C 14.5* 08/30/2012   HGBA1C 9.5 07/06/2011   HGBA1C 11.6 03/27/2011     Assessment: Diabetes control: poor control (HgbA1C >9%) Progress toward A1C goal:  unchanged Comments: She has gotten approved for [employer] today.   Plan: Medications:  Increase insulin to 34 Units in the AM, 30 in the PM, add metformin Home glucose monitoring: Frequency: 3 times a day Timing: before breakfast;before lunch;before dinner Instruction/counseling given: reminded to bring blood glucose meter & log to each visit, reminded to bring medications to each visit and discussed foot care Educational resources provided: brochure Self management tools provided: home glucose logbook Other plans: Will also change to Novolog 70/30 as it is available at her preferred pharmacy

## 2012-10-07 NOTE — Assessment & Plan Note (Addendum)
Runny nose associated with outdoor exposure and pale nasal turbinates are consistent with allergic rhinitis. 1. Advised pt to take OTC Zyrtec, Claritin, or Allegra to treat symptoms.  Update by Dr. Criselda Peaches Agree.

## 2012-10-07 NOTE — Progress Notes (Signed)
Subjective:     Patient ID: Destiny Russell, female   DOB: 1950-03-08, 62 y.o.   MRN: [id number]  HPI This is a 62 yo F w/ hx of T2DM and HTN, seen in the clinic on 09/07/12, here today for f/u on diabetes and HTN-related issues.   DM Since her last clinic visit, pt started on Humalog 75/25 32 IU in the morning, 27 IU at night. She has been following this regimen daily, but continues to have preprandial blood sugars in mid to upper 200s. Blood sugar is checked 3 times daily, always before meal. She also complains of blurry vision, fatigue, increased thirst, hunger, and urination, dry mouth, and some difficulty swallowing solids but not liquids that has been occuring since discharge from her most recent hospitalization 08/30/12. Over the past 2 weeks she has experienced stinging arm and leg pains. Over the last week she has experienced burning on the bottom of her foot.  HTN Has not been measuring at home lately, but states that it usually runs in the 120s/60-70s. She endorses SOB with exertion and while lying down and states that during this time her "tongue feels swollen." This has been occurring every night since before her hospitalization. She denies chest pain or pressure but does have occasional heartburn.She denies cold extremities.  Her diet consists of mostly vegetables and a little meat. Has some kind of carb/pasta/rice 3x/wk with meals. Does not eat a lot of bread. Pt does not exercise because she is afraid to walk outside and have an episode like the one that caused her to go to the hospital. Still engages in household chores, yard work, and Psychologist, prison and probation services without difficulty or limitations.  No alcohol, tobacco, or drug use.  Review of Systems  Constitutional: Negative for fever and appetite change.       Has gained weight  HENT: Positive for hearing loss, ear pain, neck pain and tinnitus.        Neck pain after falling out of bed just prior to hospitalization. Slightly limited range of  motion - cannot look fully over shoulder Describes ear "ringing and burning" with some occasional difficulty hearing. Also has sinus tenderness  Respiratory: Negative for cough.   Gastrointestinal: Positive for nausea. Negative for abdominal pain, diarrhea, constipation, blood in stool and abdominal distention.       Nausea when sugars are too high or low  Genitourinary: Negative for dysuria, hematuria and difficulty urinating.       Notes increased frequency of urination with increased water intake       Objective:   Physical Exam  Constitutional: She is oriented to person, place, and time. She appears well-nourished. No distress.  HENT:  Head: Normocephalic and atraumatic.  Right Ear: External ear normal.  Left Ear: External ear normal.  Nose: Nose normal.  Mouth/Throat: Oropharynx is clear and moist. No oropharyngeal exudate.  Eyes: Conjunctivae and EOM are normal. Pupils are equal, round, and reactive to light. No scleral icterus.  Neck: Neck supple. No thyromegaly present.  Cardiovascular: Normal rate, regular rhythm, normal heart sounds and intact distal pulses.  Exam reveals no gallop and no friction rub.   No murmur heard. Pulmonary/Chest: Effort normal and breath sounds normal. No respiratory distress. She has no wheezes. She has no rales.  Abdominal: Soft. Bowel sounds are normal. She exhibits no distension and no mass. There is no tenderness. There is no rebound and no guarding.  Musculoskeletal: She exhibits no edema.  Feet:  Lymphadenopathy:    She has no cervical adenopathy.  Neurological: She is alert and oriented to person, place, and time.  No focal deficits   Filed Vitals:   10/07/12 1106 10/07/12 1215  BP: 191/98 193/94  Pulse: 75 66  Temp: 97.9 F (36.6 C)   TempSrc: Oral   Height: 5\' 2"  (1.575 m)   Weight: 190 lb 12.8 oz (86.546 kg)   SpO2: 92%       Assessment:    This is a non-toxic Problem List: 1. Uncontrolled DM 2. Uncontrolled BP 3.  Allergies 4. Health maintenance  Please see problem oriented charting for full assessment    Plan:     Please see problem oriented charting for Plan

## 2012-10-07 NOTE — Assessment & Plan Note (Addendum)
Based on previous cholesterol levels on 03/27/2011 and current 10 yr ASCVD risk is 21.5%, pt qualifies for high-intensity statin therapy. Will obtain lipid profile today and make an appropriate decision of statin therapy at next visit.  Update by Dr. Criselda Peaches - -   LIpid profile today, LDL was 116.  Will discuss starting a moderate intensity statin at next visit.

## 2012-10-07 NOTE — Patient Instructions (Addendum)
It was a pleasure seeing you in clinic today!  To gain better control of your diabetes and blood pressure we are making the following changes to your medications: Metformin 500 mg twice a day. Please take before meals. Increasing your insulin to 34 units in the morning and 30 units at night. Lisinopril 5 mg once a day.  Please continue taking atenolol, 1 tab each day. Please continue to record your blood sugar as you have been doing, and check and record your blood pressure once a day.  Please make an appointment to see one of the doctors in our clinic in 2 weeks to follow up on Diabetes and Blood pressure. Feel free to call with any questions or concerns you may have.

## 2012-10-07 NOTE — Assessment & Plan Note (Signed)
Due for pneumococcal vaccination. Received in clinic today.

## 2012-10-08 LAB — MICROALBUMIN / CREATININE URINE RATIO
Creatinine, Urine: 17.3 mg/dL
Microalb Creat Ratio: 60.7 mg/g — ABNORMAL HIGH (ref 0.0–30.0)

## 2012-10-21 ENCOUNTER — Ambulatory Visit (INDEPENDENT_AMBULATORY_CARE_PROVIDER_SITE_OTHER): Payer: No Typology Code available for payment source | Admitting: Internal Medicine

## 2012-10-21 ENCOUNTER — Encounter: Payer: Self-pay | Admitting: Internal Medicine

## 2012-10-21 VITALS — BP 172/84 | HR 65 | Temp 97.3°F | Ht 62.0 in | Wt 196.1 lb

## 2012-10-21 DIAGNOSIS — I10 Essential (primary) hypertension: Secondary | ICD-10-CM

## 2012-10-21 DIAGNOSIS — E119 Type 2 diabetes mellitus without complications: Secondary | ICD-10-CM

## 2012-10-21 DIAGNOSIS — Z862 Personal history of diseases of the blood and blood-forming organs and certain disorders involving the immune mechanism: Secondary | ICD-10-CM

## 2012-10-21 LAB — BASIC METABOLIC PANEL
BUN: 19 mg/dL (ref 6–23)
CO2: 28 mEq/L (ref 19–32)
Calcium: 9.6 mg/dL (ref 8.4–10.5)
Creat: 0.89 mg/dL (ref 0.50–1.10)
Glucose, Bld: 130 mg/dL — ABNORMAL HIGH (ref 70–99)
Sodium: 141 mEq/L (ref 135–145)

## 2012-10-21 LAB — GLUCOSE, CAPILLARY: Glucose-Capillary: 153 mg/dL — ABNORMAL HIGH (ref 70–99)

## 2012-10-21 MED ORDER — INSULIN NPH ISOPHANE & REGULAR (70-30) 100 UNIT/ML ~~LOC~~ SUSP: SUBCUTANEOUS | Status: DC

## 2012-10-21 MED ORDER — METFORMIN HCL 1000 MG PO TABS: 1000.0000 mg | ORAL_TABLET | Freq: Two times a day (BID) | ORAL | Status: DC

## 2012-10-21 MED ORDER — LISINOPRIL 10 MG PO TABS: 10.0000 mg | ORAL_TABLET | Freq: Every day | ORAL | Status: DC

## 2012-10-21 NOTE — Patient Instructions (Signed)
General Instructions: Your blood pressure is high today -we are increasing your Lisinopril medication to 10 mg once per day  Your blood sugars are still somewhat high -we are increasing your Metformin medication ---for the next week, take 1000 mg every morning, and 500 mg every evening ---after 1 week, start taking 1000 mg twice per day -continue taking Novolog 70/30 as prescribed.  You will need 3 vials/month for your current dose  Please return for a follow-up visit in 4-5 weeks   Treatment Goals:  Goals (1 Years of Data) as of 10/21/12         As of Today 10/07/12 10/07/12 09/07/12 08/31/12     Blood Pressure    . Blood Pressure < 140/90  172/84 193/94 191/98 130/70 122/62     Result Component    . HEMOGLOBIN A1C < 7.0          . LDL CALC < 100   116         Progress Toward Treatment Goals:  Treatment Goal 10/21/2012  Hemoglobin A1C improved  Blood pressure unchanged    Self Care Goals & Plans:  Self Care Goal 10/07/2012  Manage my medications take my medicines as prescribed; refill my medications on time  Monitor my health keep track of my blood glucose; check my feet daily  Eat healthy foods drink diet soda or water instead of juice or soda; eat more vegetables; eat foods that are low in salt; eat baked foods instead of fried foods; eat fruit for snacks and desserts; eat smaller portions  Be physically active (No Data)    Home Blood Glucose Monitoring 10/21/2012  Check my blood sugar 2 times a day  When to check my blood sugar before meals     Care Management & Community Referrals:  Referral 10/21/2012  Referrals made for care management support none needed

## 2012-10-21 NOTE — Assessment & Plan Note (Signed)
Lab Results  Component Value Date   HGBA1C 14.5* 08/30/2012   HGBA1C 9.5 07/06/2011   HGBA1C 11.6 03/27/2011     Assessment: Diabetes control: poor control (HgbA1C >9%) Progress toward A1C goal:  improved Comments: Blood sugars seem better controlled since last visit.  Given symptoms of hypoglycemia with CBG's in the 90's, will proceed cautiously with further glucose control.  Will uptitrate metformin to goal, though can consider discontinuing if pt still has bothersome symptoms of constipation.  Plan: Medications:  Uptitrate metformin from 500 mg BID to 1000 mg BID.  Continue novolog 70/30, 34 and 30 units. Home glucose monitoring: Frequency: 2 times a day Timing: before meals Instruction/counseling given: reminded to bring blood glucose meter & log to each visit Educational resources provided:   Self management tools provided:   Other plans: Recheck at next visit

## 2012-10-21 NOTE — Assessment & Plan Note (Signed)
BP Readings from Last 3 Encounters:  10/21/12 172/84  10/07/12 193/94  09/07/12 130/70    Lab Results  Component Value Date   NA 141 08/31/2012   K 3.9 08/31/2012   CREATININE 0.72 08/31/2012    Assessment: Blood pressure control: moderately elevated Progress toward BP goal:  unchanged Comments: BP still elevated.  Will increase dose of lisinopril to 10, and check BMET today.  Plan: Medications:  Increase lisinopril to 10 mg daily. Educational resources provided:   Self management tools provided:   Other plans: check BMET

## 2012-10-21 NOTE — Progress Notes (Signed)
HPI The patient is a 62 y.o. female with a history of HTN, DM, CAD, presenting for a follow-up visit.  The patient started lisinopril at her last visit for BP of 193/94.  BP is somewhat lower, but still elevated today at 172/84.  The patient has a history of DM.  The patient notes blood sugars as low as 98, with symptoms of nausea and "shakiness".  Her highest blood sugars recorded are in the low 200's, though she does note occasional blurry vision, polyruria, polydipsia.  Since starting metformin (currently 500 mg BID), she notes symptoms of constipation (BM every 3 days).  ROS: General: no fevers, chills, changes in weight, changes in appetite Skin: no rash HEENT: no blurry vision, hearing changes, sore throat Pulm: no dyspnea, coughing, wheezing CV: no chest pain, palpitations, shortness of breath Abd: no abdominal pain, nausea/vomiting, diarrhea/constipation GU: no dysuria, hematuria, polyuria Ext: no arthralgias, myalgias Neuro: no weakness, numbness, or tingling  Filed Vitals:   10/21/12 1035  BP: 172/84  Pulse: 65  Temp: 97.3 F (36.3 C)    PEX General: alert, cooperative, and in no apparent distress HEENT: pupils equal round and reactive to light, vision grossly intact, oropharynx clear and non-erythematous  Neck: supple Lungs: clear to ascultation bilaterally, normal work of respiration, no wheezes, rales, ronchi Heart: regular rate and rhythm, no murmurs, gallops, or rubs Abdomen: soft, non-tender, non-distended, normal bowel sounds Extremities: no cyanosis, clubbing, or edema Neurologic: alert & oriented X3, cranial nerves II-XII intact, strength grossly intact, sensation intact to light touch  Current Outpatient Prescriptions on File Prior to Visit  Medication Sig Dispense Refill  . aspirin EC 81 MG tablet Take 81 mg by mouth daily.      Marland Kitchen atenolol (TENORMIN) 100 MG tablet Take 1 tablet (100 mg total) by mouth daily.  90 tablet  3  . insulin NPH-regular (NOVOLIN  70/30) (70-30) 100 UNIT/ML injection Take 34 Units in the morning and 30 Units at night  10 mL  3  . lisinopril (PRINIVIL,ZESTRIL) 5 MG tablet Take 1 tablet (5 mg total) by mouth daily.  30 tablet  1  . metFORMIN (GLUCOPHAGE) 500 MG tablet Take 1 tablet (500 mg total) by mouth 2 (two) times daily with a meal.  60 tablet  1   No current facility-administered medications on file prior to visit.    Assessment/Plan

## 2012-10-31 NOTE — Progress Notes (Signed)
Case discussed with Dr. Brown at the time of the visit.  We reviewed the resident's history and exam and pertinent patient test results.  I agree with the assessment, diagnosis, and plan of care documented in the resident's note. 

## 2012-12-05 ENCOUNTER — Encounter: Payer: Self-pay | Admitting: Internal Medicine

## 2012-12-05 ENCOUNTER — Ambulatory Visit (INDEPENDENT_AMBULATORY_CARE_PROVIDER_SITE_OTHER): Payer: No Typology Code available for payment source | Admitting: Internal Medicine

## 2012-12-05 VITALS — BP 131/71 | HR 77 | Temp 96.7°F | Ht 63.0 in | Wt 198.4 lb

## 2012-12-05 DIAGNOSIS — Z862 Personal history of diseases of the blood and blood-forming organs and certain disorders involving the immune mechanism: Secondary | ICD-10-CM

## 2012-12-05 DIAGNOSIS — R259 Unspecified abnormal involuntary movements: Secondary | ICD-10-CM

## 2012-12-05 DIAGNOSIS — J309 Allergic rhinitis, unspecified: Secondary | ICD-10-CM

## 2012-12-05 DIAGNOSIS — I10 Essential (primary) hypertension: Secondary | ICD-10-CM

## 2012-12-05 DIAGNOSIS — Z0000 Encounter for general adult medical examination without abnormal findings: Secondary | ICD-10-CM

## 2012-12-05 DIAGNOSIS — E119 Type 2 diabetes mellitus without complications: Secondary | ICD-10-CM

## 2012-12-05 DIAGNOSIS — E785 Hyperlipidemia, unspecified: Secondary | ICD-10-CM

## 2012-12-05 LAB — POCT GLYCOSYLATED HEMOGLOBIN (HGB A1C): Hemoglobin A1C: 7.3

## 2012-12-05 LAB — GLUCOSE, CAPILLARY: Glucose-Capillary: 100 mg/dL — ABNORMAL HIGH (ref 70–99)

## 2012-12-05 MED ORDER — INSULIN NPH ISOPHANE & REGULAR (70-30) 100 UNIT/ML ~~LOC~~ SUSP: SUBCUTANEOUS | Status: DC

## 2012-12-05 MED ORDER — FLUTICASONE PROPIONATE 50 MCG/ACT NA SUSP: 2.0000 | Freq: Every day | NASAL | Status: DC

## 2012-12-05 MED ORDER — METOPROLOL SUCCINATE ER 100 MG PO TB24: 100.0000 mg | ORAL_TABLET | Freq: Every day | ORAL | Status: DC

## 2012-12-05 MED ORDER — ROSUVASTATIN CALCIUM 20 MG PO TABS: 20.0000 mg | ORAL_TABLET | Freq: Every day | ORAL | Status: DC

## 2012-12-05 MED ORDER — OLMESARTAN MEDOXOMIL 20 MG PO TABS: 20.0000 mg | ORAL_TABLET | Freq: Every day | ORAL | Status: DC

## 2012-12-05 NOTE — Patient Instructions (Addendum)
-  Take your insulin 30U in AM & 30 U in PM -Check your blood sugar twice a day and be sure not to skip meals -Please start taking Crestor 20 mg daily to help lower your cholesterol -Will change your lisinopril and atenolol to preferred MAP medications -Please use the stool card and return on your next visit -Please check your blood pressure at home and bring a log next visit -Please use nasal spray, fluticasone as needed for allergic symptoms  -Pleasure meeting you---see you in 1 month

## 2012-12-05 NOTE — Assessment & Plan Note (Addendum)
Assessment: Pt with well controlled blood pressure improved from 172/84 on 10/17 to 131/71 today with atenolol 100 mg daily and recent dose change of lisinopril 5 mg to 10 mg. Patient reports she is compliant with medications.  Plan: -BP of 131/71 at goal <140/80 -Change lisinopril 10 mg to olmesartan medoxomil 20 mg daily due to MAP preference  -Change atenolol 100 mg to metoprolol succinate 100 mg daily due to MAP preference   -Obtain CMP -Patient instructed to monitor BP at home and bring a log in at next visit

## 2012-12-05 NOTE — Assessment & Plan Note (Signed)
-  Pt declined pap smear, mammogram, colonoscopy, and herpes zoster vaccine today (12/05/12) -Pt given stool card to bring in on next visit

## 2012-12-05 NOTE — Assessment & Plan Note (Signed)
Assessment: Patient with symptoms of allergic rhinitis that is uncontrolled with OTC allergy medications.  Plan: -Start fluticasone nasal spray (2 sprays in both nostrils daily)

## 2012-12-05 NOTE — Assessment & Plan Note (Addendum)
Assessment:  Last lipid panel on 10/07/12 with LDL 116 not at goal <100.    Plan:  -Initiate statin therapy with Rosuvastatin (Crestor) 20 mg daily (covered by MAP) -Obtain CMP for baseline LFTs -Pt counseled on possible myalgias and weakness -Pt to return in 1 month

## 2012-12-05 NOTE — Assessment & Plan Note (Addendum)
Assessment:  Pt with complicated insulin-dependent T2DM (nephropathy and neuropathy) with last HbA1c of 14.5 on 08/30/12 with improved control to 7.3 today, indicating average blood sugar of 163 over the past 3 months who is on Novolin 70/30 34U in AM and 30U in PM, recently changed from metformin 500 mg to 1000 mg BID who has been having  episodes of symptomatic hypoglycemia. Pt with CBG in clinic of 63 that later improved to 100 without food (pt reported she just ate pizza) and was asymptomatic. Also with neuropathy in glove and stocking pattern and weight gain. She had annual dilated eye exam on 09/07/12 which did not reveal diabetic retinopathy in either eye.   Plan:   -HbA1c 7.3 not quite at goal <7.0 but much improved from 14.5 on 08/30/12! -Due to episodes of hypoglycemia, adjust (70/30) 34U in AM & 30 U PM to 30 U BID   -BP 131/71 today at goal <140/90  -Last lipid panel on 10/07/12 with LDL 116 not at goal <100, will start Rosuvastatin 20 mg   -CKD progression prevention: change ACEi lisinopril 10 mg to ARB olmesartan medoxomil 20 mg due to MAP preference   -Last foot exam on 09/07/12  -Encourage weight loss BMI not at goal <25  -Last retinal exam on 09/07/12 without diabetic retinopathy  -Continue daily 81 mg aspirin for primary CV protection   -Consider starting gabapentin if neuropathy becomes bothersome (obtain CBC at next visit to r/o anemia if RLS) -Pt received annual influenza vaccination this year  -Will see back in 1 month

## 2012-12-05 NOTE — Assessment & Plan Note (Addendum)
Assessment:  Pt reports tremor of hand and head, possible due to essential tremor. She reports being told she may have Parkinson's disease however no visible resting or pill tremor, akinesia,  postural instability, rigidity, or personality/speech changes to suggest PD.      Plan: -Consider referral to Neurology if symptoms worsen -Consider changing beta blocker to propanol if essential tremor (if medication covered by MAP)

## 2012-12-05 NOTE — Progress Notes (Signed)
Patient ID: Destiny Russell, female   DOB: September 09, 1950, 62 y.o.   MRN: [id number]   Subjective:   Patient ID: Destiny Russell female   DOB: June 25, 1950 62 y.o.   MRN: [id number]  HPI: Destiny Russell is a 62 y.o. with history of insulin-dependent Type 2 Diabetes Mellitus, hypertension, hyperlipidemia, GERD, and allergic rhinitis who presents for follow-up visit after change in anti-hypertensive and diabetes medications on 10/21/12.  Pt with last HbA1c of 14.5 on 08/30/12. She has been complaint with taking Novolin 70/30 34U in AM and 30U in PM and was recently changed from metformin 500 mg to 1000 mg BID. She reports checking her glucose meter x2 daily (in the morning before breakfast and at night) with average blood sugar in 100's. She has had some values below 80 with symptomatic hypoglycemia in the morning and before lunch where she feels weak with vision changes. She reports not skipping meals.  She denies polyuria, polydipsia, and polyphagia. She has numbness and tingling in her fingers and toes that is worse at night and with sitting that is sometimes associated with burning and throbbing pain. She does not wish to try medications at this time. She reports blurry vision when her blood sugars are low or too high. She had annual dilated on 09/07/12 which did not reveal diabetic retinopathy in either eye. No recent infections or foot injuries. She has never had a MI or stroke. She reports following a low carbohydrate diet and admits to not exercising regularly. She reports weight gain since being on insulin and it appears her weight has increased 8 lbs since visit on 10/07/12. No loose stools or diarrhea from metformin use.   She reports checking her blood pressure at home which have been in the normal range (<140/80). On her last visit, lisinopril was increased from 5 to 10 mg daily. She denies headache, palpitations, chest pain, dyspnea, or lightheadedness.   She reports nasal congestion at times due  to allergic rhinitis which is unrelieved with OTC allergy medications.    She reports being told she may have Parkinson's disease however has never seen a neurologist. She reports tremor in her hands while at rest that is not present all the time. Per son she occasionally also has head tremors as well. No slowed movement, gait changes, postural instability, rigidity, or personality/speech changes.     Past Medical History  Diagnosis Date  . Parkinson's disease   . Diabetes mellitus without complication   . Hypertension   . Shortness of breath   . GERD (gastroesophageal reflux disease)    Current Outpatient Prescriptions  Medication Sig Dispense Refill  . aspirin EC 81 MG tablet Take 81 mg by mouth daily.      Marland Kitchen atenolol (TENORMIN) 100 MG tablet Take 1 tablet (100 mg total) by mouth daily.  90 tablet  3  . insulin NPH-regular (NOVOLIN 70/30) (70-30) 100 UNIT/ML injection Take 34 Units in the morning and 30 Units at night  30 mL  11  . lisinopril (PRINIVIL,ZESTRIL) 10 MG tablet Take 1 tablet (10 mg total) by mouth daily.  30 tablet  3  . metFORMIN (GLUCOPHAGE) 1000 MG tablet Take 1 tablet (1,000 mg total) by mouth 2 (two) times daily with a meal.  60 tablet  2   No current facility-administered medications for this visit.   Family History  Problem Relation Age of Onset  . Hypertension Mother   . Hypertension Father    History   Social History  .  Marital Status: Married    Spouse Name: N/A    Number of Children: N/A  . Years of Education: 9th   Social History Main Topics  . Smoking status: Never Smoker   . Smokeless tobacco: Current User    Types: Snuff, Chew  . Alcohol Use: No  . Drug Use: No  . Sexual Activity: Not on file   Other Topics Concern  . Not on file   Social History Narrative  . No narrative on file   Review of Systems: Review of Systems  Constitutional: Negative for fever, chills, malaise/fatigue and diaphoresis.       Weight gain   HENT: Positive for  congestion (chronic) and ear pain. Negative for ear discharge and sore throat.   Eyes: Positive for blurred vision (with hypoglycemia & hyperglycemia ).  Respiratory: Positive for shortness of breath. Negative for cough.   Cardiovascular: Negative for chest pain, palpitations and leg swelling.  Gastrointestinal: Positive for constipation. Negative for nausea, vomiting, abdominal pain, diarrhea, blood in stool and melena.  Genitourinary: Negative for dysuria, urgency, frequency and hematuria.  Musculoskeletal: Positive for joint pain (knees). Negative for back pain and myalgias.       Right shoulder blade pain  Skin: Negative for rash.  Neurological: Positive for tremors, sensory change (numbness/tingling in fingertips and feet) and weakness (with hypoglycemia ). Negative for dizziness, focal weakness and headaches.    Objective:  Physical Exam: Filed Vitals:   12/05/12 1332  BP: 131/71  Pulse: 77  Temp: 96.7 F (35.9 C)  TempSrc: Oral  Height: 5\' 3"  (1.6 m)  Weight: 198 lb 6.4 oz (89.994 kg)  SpO2: 97%   Physical Exam  Constitutional: She is oriented to person, place, and time. She appears well-developed and well-nourished. No distress.  HENT:  Head: Normocephalic and atraumatic.  Right Ear: External ear normal.  Left Ear: External ear normal.  Nose: Nose normal.  Mouth/Throat: No oropharyngeal exudate.  Eyes: Conjunctivae and EOM are normal. Pupils are equal, round, and reactive to light. Right eye exhibits no discharge. Left eye exhibits no discharge. No scleral icterus.  Neck: Normal range of motion. Neck supple.  Cardiovascular: Normal rate, regular rhythm and normal heart sounds.   Pulmonary/Chest: Effort normal and breath sounds normal. No respiratory distress. She has no wheezes. She has no rales.  Abdominal: Soft. Bowel sounds are normal. She exhibits no distension. There is no tenderness. There is no rebound and no guarding.  Musculoskeletal: Normal range of motion. She  exhibits no edema and no tenderness.  Neurological: She is alert and oriented to person, place, and time. No cranial nerve deficit.  Skin: Skin is warm and dry. She is not diaphoretic.  Small calluses on both feet  Psychiatric: She has a normal mood and affect. Her behavior is normal. Judgment and thought content normal.    Assessment & Plan:   Please see problem-list for problem based assessment and plan

## 2012-12-06 ENCOUNTER — Telehealth: Payer: Self-pay | Admitting: *Deleted

## 2012-12-06 LAB — COMPLETE METABOLIC PANEL WITH GFR
Albumin: 4.1 g/dL (ref 3.5–5.2)
Alkaline Phosphatase: 54 U/L (ref 39–117)
BUN: 13 mg/dL (ref 6–23)
Calcium: 9.7 mg/dL (ref 8.4–10.5)
GFR, Est African American: >89 mL/min
GFR, Est Non African American: 82 mL/min
Glucose, Bld: 79 mg/dL (ref 70–99)
Potassium: 3.9 mEq/L (ref 3.5–5.3)
Total Bilirubin: 0.5 mg/dL (ref 0.3–1.2)

## 2012-12-06 NOTE — Telephone Encounter (Signed)
[facility] from fluticasone to nasonex nasal spray- it is the only one they can get

## 2012-12-07 ENCOUNTER — Other Ambulatory Visit: Payer: Self-pay | Admitting: Internal Medicine

## 2012-12-07 MED ORDER — MOMETASONE FUROATE 50 MCG/ACT NA SUSP: 2.0000 | Freq: Every day | NASAL | Status: DC

## 2012-12-07 NOTE — Progress Notes (Signed)
I saw and evaluated the patient.  I personally confirmed the key portions of the history and exam documented by Dr. Rabbani and I reviewed pertinent patient test results.  The assessment, diagnosis, and plan were formulated together and I agree with the documentation in the resident's note.  

## 2013-01-13 ENCOUNTER — Encounter: Payer: Self-pay | Admitting: Internal Medicine

## 2013-01-13 ENCOUNTER — Ambulatory Visit (INDEPENDENT_AMBULATORY_CARE_PROVIDER_SITE_OTHER): Payer: No Typology Code available for payment source | Admitting: Internal Medicine

## 2013-01-13 VITALS — BP 155/82 | HR 73 | Temp 96.9°F | Wt 199.0 lb

## 2013-01-13 DIAGNOSIS — I10 Essential (primary) hypertension: Secondary | ICD-10-CM

## 2013-01-13 DIAGNOSIS — E785 Hyperlipidemia, unspecified: Secondary | ICD-10-CM

## 2013-01-13 DIAGNOSIS — J309 Allergic rhinitis, unspecified: Secondary | ICD-10-CM

## 2013-01-13 DIAGNOSIS — G2581 Restless legs syndrome: Secondary | ICD-10-CM

## 2013-01-13 DIAGNOSIS — Z8639 Personal history of other endocrine, nutritional and metabolic disease: Secondary | ICD-10-CM

## 2013-01-13 DIAGNOSIS — Z0000 Encounter for general adult medical examination without abnormal findings: Secondary | ICD-10-CM

## 2013-01-13 DIAGNOSIS — Z862 Personal history of diseases of the blood and blood-forming organs and certain disorders involving the immune mechanism: Secondary | ICD-10-CM

## 2013-01-13 DIAGNOSIS — E119 Type 2 diabetes mellitus without complications: Secondary | ICD-10-CM

## 2013-01-13 LAB — CBC
HCT: 39.9 % (ref 36.0–46.0)
Hemoglobin: 14.3 g/dL (ref 12.0–15.0)
MCH: 30.4 pg (ref 26.0–34.0)
MCHC: 35.8 g/dL (ref 30.0–36.0)
MCV: 84.7 fL (ref 78.0–100.0)
Platelets: 222 K/uL (ref 150–400)
RBC: 4.71 MIL/uL (ref 3.87–5.11)
RDW: 14 % (ref 11.5–15.5)
WBC: 6.7 K/uL (ref 4.0–10.5)

## 2013-01-13 LAB — GLUCOSE, CAPILLARY: Glucose-Capillary: 125 mg/dL — ABNORMAL HIGH (ref 70–99)

## 2013-01-13 MED ORDER — MOMETASONE FUROATE 50 MCG/ACT NA SUSP: 2.0000 | Freq: Every day | NASAL | Status: DC

## 2013-01-13 MED ORDER — OLMESARTAN MEDOXOMIL 20 MG PO TABS: 20.0000 mg | ORAL_TABLET | Freq: Every day | ORAL | Status: DC

## 2013-01-13 MED ORDER — METOPROLOL SUCCINATE ER 100 MG PO TB24: 100.0000 mg | ORAL_TABLET | Freq: Every day | ORAL | Status: DC

## 2013-01-13 MED ORDER — METFORMIN HCL 1000 MG PO TABS: 1000.0000 mg | ORAL_TABLET | Freq: Two times a day (BID) | ORAL | Status: DC

## 2013-01-13 MED ORDER — ROSUVASTATIN CALCIUM 5 MG PO TABS: 5.0000 mg | ORAL_TABLET | Freq: Every day | ORAL | Status: DC

## 2013-01-13 NOTE — Patient Instructions (Addendum)
-  Keep taking metformin 1000 mg twice a day and Insulin 70/30 30 U in AM & PM  -Continue to check your sugars and please bring your meter next time -Start taking rosuvastatin 5 mg daily for cholesterol  -Take olmesartan, stop lisinopril for high blood pressure -Take metoprolol succinate, stop atenolol for high blood pressure -Use Nasonex for allergies -Will check some labs today -Nice seeing you, will see back in 2 months

## 2013-01-14 LAB — TSH: TSH: 1.183 u[IU]/mL (ref 0.350–4.500)

## 2013-01-22 NOTE — Progress Notes (Signed)
Patient ID: Destiny Russell, female   DOB: 14-May-1950, 63 y.o.   MRN: [id number]   Subjective:   Patient ID: Destiny Russell female   DOB: [birth year] 63 y.o.   MRN: [id number]  HPI: [name] is a 63 y.o. very pleasant women with history of insulin-dependent Type 2 Diabetes Mellitus, hypertension, hyperlipidemia, GERD, and allergic rhinitis who presents for follow-up visit after change in a diabetes medications on 10/21/12.   Pt with last HbA1c of 7.3 on 12/05/12. She has been complaint with taking Novolin 70/30 30U BID and metformin 1000 mg BID (with no AE's). She forgot to bring her glucose meter today, but reports checking her glucose meter x2 daily (in the morning before breakfast and at night) with average blood sugar of 80 in the morning.  She denies symptomatic hypoglycemia and denies skipping meals. She sometimes has blurry vision but denies polyuria, polydipsia, or polyphagia. She continues to have numbness and tingling in her legs and feet that is worse at night and with sitting that is sometimes associated with burning and throbbing pain. She does not wish to try medications at this time. She had annual dilated on 09/07/12 which did not reveal diabetic retinopathy in either eye. No recent infections or foot injuries. She reports following a low carbohydrate diet and admits to not exercising regularly. She reports no recent weight gain.  She reports checking her blood pressure at home which have been in the normal range (<140/80). She has been taking olmesartan instead of lisinopril. She was unable to obtain metoprolol succinate and is still taking atenolol. She denies headache, palpitations, chest pain, dyspnea, or lightheadedness.   She reports chronic nasal congestion for months due to allergic rhinitis which is unrelieved with OTC allergy medications. She was unable to obtain fluticasone nasal spray prescribed at last visit.  She has brought in the stool card that was given at last  visit. She denies recent bloody or black stools.        Past Medical History  Diagnosis Date  . Parkinson's disease   . Diabetes mellitus without complication   . Hypertension   . Shortness of breath   . GERD (gastroesophageal reflux disease)    Current Outpatient Prescriptions  Medication Sig Dispense Refill  . aspirin EC 81 MG tablet Take 81 mg by mouth daily.      . insulin NPH-regular (NOVOLIN 70/30) (70-30) 100 UNIT/ML injection Take 30 Units in the morning and 30 Units at night  30 mL  11  . metFORMIN (GLUCOPHAGE) 1000 MG tablet Take 1 tablet (1,000 mg total) by mouth 2 (two) times daily with a meal.  180 tablet  3  . metoprolol succinate (TOPROL XL) 100 MG 24 hr tablet Take 1 tablet (100 mg total) by mouth daily. Take with or immediately following a meal.  90 tablet  4  . mometasone (NASONEX) 50 MCG/ACT nasal spray Place 2 sprays into the nose daily.  17 g  3  . olmesartan (BENICAR) 20 MG tablet Take 1 tablet (20 mg total) by mouth daily.  90 tablet  3  . rosuvastatin (CRESTOR) 5 MG tablet Take 1 tablet (5 mg total) by mouth daily.  90 tablet  4   No current facility-administered medications for this visit.   Family History  Problem Relation Age of Onset  . Hypertension Mother   . Hypertension Father    History   Social History  . Marital Status: Married    Spouse Name: N/A  Number of Children: N/A  . Years of Education: 9th   Social History Main Topics  . Smoking status: Never Smoker   . Smokeless tobacco: Current User    Types: Snuff, Chew  . Alcohol Use: No  . Drug Use: No  . Sexual Activity: None   Other Topics Concern  . None   Social History Narrative  . None   Review of Systems: Review of Systems  Constitutional: Negative for fever, chills, weight loss and malaise/fatigue.  HENT: Positive for congestion (chronic allergic rhinits ). Negative for sore throat.   Eyes: Positive for blurred vision (when blood sugar high).  Respiratory: Negative for  cough and shortness of breath.   Gastrointestinal: Negative for nausea, vomiting, abdominal pain and diarrhea.  Genitourinary: Negative for dysuria, urgency, frequency and hematuria.  Musculoskeletal: Negative for myalgias.  Skin: Negative for rash.  Neurological: Positive for sensory change (chronic in fingers, legs, and feet). Negative for dizziness, weakness and headaches.  Endo/Heme/Allergies: Negative for polydipsia.  Psychiatric/Behavioral: Negative for depression.    Objective:  Physical Exam: Filed Vitals:   01/13/13 1358  BP: 155/82  Pulse: 73  Temp: 96.9 F (36.1 C)  TempSrc: Oral  Weight: 199 lb (90.266 kg)  SpO2: 98%    Physical Exam  Constitutional: She is oriented to person, place, and time. She appears well-developed and well-nourished. No distress.  HENT:  Head: Normocephalic and atraumatic.  Right Ear: External ear normal.  Left Ear: External ear normal.  Nose: Nose normal.  Mouth/Throat: Oropharynx is clear and moist. No oropharyngeal exudate.  No sinus tenderness  Eyes: Conjunctivae are normal. Right eye exhibits no discharge. Left eye exhibits no discharge.  Neck: Normal range of motion. Neck supple.  Cardiovascular: Normal rate, regular rhythm and normal heart sounds.   No murmur heard. Pulmonary/Chest: Effort normal and breath sounds normal. No respiratory distress. She has no wheezes. She has no rales.  Abdominal: Soft. Bowel sounds are normal. She exhibits no distension. There is no tenderness. There is no rebound and no guarding.  Musculoskeletal: Normal range of motion. She exhibits no edema and no tenderness.  Neurological: She is alert and oriented to person, place, and time.  Skin: Skin is warm and dry. No rash noted. She is not diaphoretic. No erythema. No pallor.  Psychiatric: She has a normal mood and affect. Her behavior is normal. Judgment and thought content normal.    Assessment & Plan:   Please see problem list for problem based  assessment and plan

## 2013-01-22 NOTE — Assessment & Plan Note (Signed)
Assessment: Pt with insulin-dependent T2DM with last HbA1c of 7.3 on 12/05/12  who is compliant with insulin and metformin therapy with no recent episodes of symptomatic hypoglycemia who presents with CBG of 125.    Plan:  -HbA1c 7.3 not at goal <7.0   -Continue Novolog  (70/30) 30 U BID and metformin 1000 mg BID -BP 155/82 today not at goal <140/90  -Last lipid panel on 10/07/12 with LDL 116 not at goal <100, pt to start Rosuvastatin 5 mg daily  -CKD progression prevention, continue olmesartan medoxomil 20 mg (due to [facility] preference)  -Last foot exam on 09/07/12  -Encourage weight loss BMI not at goal <25  -Last retinal exam on 09/07/12 without diabetic retinopathy  -Continue daily 81 mg aspirin for primary CV protection  -Consider starting gabapentin if neuropathy becomes bothersome (CBC with no anemia and TSH wnl)  -Pt received annual influenza vaccination this year  -Will see back in 2 months, pt to bring glucose meter for insulin adjustment at that time

## 2013-01-22 NOTE — Assessment & Plan Note (Signed)
Assessment: Pt with last lipid panel on 10/07/12 with hypercholesteremia (LDL 116) who is not currently on statin therapy.   Plan:  -LDL 116 not at goal <100 -Start Rosuvastatin (Crestor) 5 mg daily (covered by MAP)  -CMP on 12/05/12 with normal liver function

## 2013-01-22 NOTE — Assessment & Plan Note (Signed)
-  Pt declined pap smear, mammogram, colonoscopy, and herpes zoster vaccine today (01/13/13)  -Pt brought stool card from previous visit

## 2013-01-22 NOTE — Assessment & Plan Note (Addendum)
Assessment: Pt with chronic allergic rhinitis that is uncontrolled with OTC allergy medications.  Plan: -Start mometasone nasal spray daily -Continue to monitor

## 2013-01-22 NOTE — Assessment & Plan Note (Addendum)
Assessment: Pt with moderately well controlled blood pressure who is compliant with two-class (ARB & BB) anti-hypertensive therapy who presents with blood pressure of 155/82.    Plan:  -BP of 155/82 not at goal <140/80  -Continue olmesartan medoxomil 20 mg daily (due to [facility] preference)  -Pt to change atenolol 100 mg to metoprolol succinate 100 mg daily due to [facility] preference  -Patient instructed to monitor BP at home and bring a log in at next visit -CMP within normal limit on 12/05/12 -Continue to monitor blood pressure

## 2013-01-24 ENCOUNTER — Ambulatory Visit: Payer: No Typology Code available for payment source

## 2013-01-27 NOTE — Progress Notes (Signed)
Case discussed with Dr. Rabbani soon after the resident saw the patient.  We reviewed the resident's history and exam and pertinent patient test results.  I agree with the assessment, diagnosis, and plan of care documented in the resident's note. 

## 2013-01-30 ENCOUNTER — Other Ambulatory Visit: Payer: No Typology Code available for payment source

## 2013-01-30 DIAGNOSIS — Z1211 Encounter for screening for malignant neoplasm of colon: Secondary | ICD-10-CM

## 2013-01-30 LAB — POC HEMOCCULT BLD/STL (HOME/3-CARD/SCREEN)
Card #2 Fecal Occult Blod, POC: NEGATIVE
FECAL OCCULT BLD: NEGATIVE
FECAL OCCULT BLD: NEGATIVE

## 2013-03-10 ENCOUNTER — Ambulatory Visit (INDEPENDENT_AMBULATORY_CARE_PROVIDER_SITE_OTHER): Payer: No Typology Code available for payment source | Admitting: Internal Medicine

## 2013-03-10 ENCOUNTER — Encounter: Payer: Self-pay | Admitting: Internal Medicine

## 2013-03-10 VITALS — BP 138/88 | HR 94 | Temp 98.5°F | Ht 62.0 in | Wt 202.4 lb

## 2013-03-10 DIAGNOSIS — Z0000 Encounter for general adult medical examination without abnormal findings: Secondary | ICD-10-CM

## 2013-03-10 DIAGNOSIS — E119 Type 2 diabetes mellitus without complications: Secondary | ICD-10-CM

## 2013-03-10 DIAGNOSIS — I10 Essential (primary) hypertension: Secondary | ICD-10-CM

## 2013-03-10 DIAGNOSIS — E785 Hyperlipidemia, unspecified: Secondary | ICD-10-CM

## 2013-03-10 DIAGNOSIS — Z794 Long term (current) use of insulin: Principal | ICD-10-CM

## 2013-03-10 LAB — GLUCOSE, CAPILLARY: GLUCOSE-CAPILLARY: 85 mg/dL (ref 70–99)

## 2013-03-10 LAB — POCT GLYCOSYLATED HEMOGLOBIN (HGB A1C): Hemoglobin A1C: 6.6

## 2013-03-10 NOTE — Progress Notes (Signed)
[facility] Subjective:   Patient ID: Destiny Russell female   DOB: 07-28-1950 63 y.o.   MRN: [id number]  HPI: Destiny Russell is a 63 y.o. female with a PMH significant for insulin dependent T2DM, HTN, HLD, CAD.  She presents today for regular follow up.  She brings her home glucose log.  DM Diagnosed: T2 Diabetes Mellitus 30 years ago DM Symptoms: None DKA: Never HHS: Never Glucometer:Unsure of name Testing Frequency: 2 times daily Readings:80-120 in AM 100-140 in PM Hypoglycemic episodes: none HTN She checks her BP occasionally at home but does not have any recordings.  She reports her BP has been under better control since her last medication change.  Past Medical History  Diagnosis Date  . Parkinson's disease   . Diabetes mellitus without complication   . Hypertension   . Shortness of breath   . GERD (gastroesophageal reflux disease)    Current Outpatient Prescriptions  Medication Sig Dispense Refill  . aspirin EC 81 MG tablet Take 81 mg by mouth daily.      . insulin NPH-regular (NOVOLIN 70/30) (70-30) 100 UNIT/ML injection Take 30 Units in the morning and 30 Units at night  30 mL  11  . metFORMIN (GLUCOPHAGE) 1000 MG tablet Take 1 tablet (1,000 mg total) by mouth 2 (two) times daily with a meal.  180 tablet  3  . metoprolol succinate (TOPROL XL) 100 MG 24 hr tablet Take 1 tablet (100 mg total) by mouth daily. Take with or immediately following a meal.  90 tablet  4  . mometasone (NASONEX) 50 MCG/ACT nasal spray Place 2 sprays into the nose daily.  17 g  3  . olmesartan (BENICAR) 20 MG tablet Take 1 tablet (20 mg total) by mouth daily.  90 tablet  3  . rosuvastatin (CRESTOR) 5 MG tablet Take 1 tablet (5 mg total) by mouth daily.  90 tablet  4   No current facility-administered medications for this visit.   Family History  Problem Relation Age of Onset  . Hypertension Mother   . Hypertension Father    History   Social History  . Marital  Status: Married    Spouse Name: N/A    Number of Children: N/A  . Years of Education: 9th   Social History Main Topics  . Smoking status: Never Smoker   . Smokeless tobacco: Current User    Types: Snuff, Chew  . Alcohol Use: No  . Drug Use: No  . Sexual Activity: None   Other Topics Concern  . None   Social History Narrative  . None   Review of Systems: Review of Systems  Constitutional: Negative for fever, chills, weight loss and malaise/fatigue (tired occasionally but unchanged).  Eyes: Positive for blurred vision (when my sugar gets high).  Respiratory: Positive for cough (occasional non productive). Negative for shortness of breath.   Cardiovascular: Negative for chest pain and leg swelling.  Gastrointestinal: Positive for heartburn (with spicey foods). Negative for nausea, vomiting and abdominal pain.  Genitourinary: Negative for dysuria and frequency.  Musculoskeletal: Negative for myalgias.  Skin: Negative for rash.  Neurological: Positive for headaches (occasionally, note severe).  Psychiatric/Behavioral: Negative for depression.     Objective:  Physical Exam: Filed Vitals:   03/10/13 1409  BP: 147/88  Pulse: 94  Temp: 98.5 F (36.9 C)  TempSrc: Oral  Height: 5\' 2"  (1.575 m)  Weight: 202 lb 6.4 oz (91.808 kg)  SpO2: 97%  Physical Exam  Nursing note and vitals reviewed. Constitutional: She is well-developed, well-nourished, and in no distress. No distress.  Eyes: EOM are normal.  Neck: Normal range of motion. Neck supple. No thyromegaly present.  Cardiovascular: Normal rate, regular rhythm, normal heart sounds and intact distal pulses.   No murmur heard. Pulmonary/Chest: Effort normal and breath sounds normal. No respiratory distress. She has no wheezes.  Abdominal: Soft. Bowel sounds are normal. She exhibits no distension. There is no tenderness.  Musculoskeletal: She exhibits no edema.  Skin: Skin is warm and dry. She is not diaphoretic.  Psychiatric:  Mood, memory, affect and judgment normal.    Assessment & Plan:  Case discussed with Dr. Rogelia Boga See Problem Based Assessment and Plan Medications Ordered No orders of the defined types were placed in this encounter.   Other Orders Orders Placed This Encounter  Procedures  . Glucose, capillary  . POCT glycosylated hemoglobin (Hb A1C)

## 2013-03-10 NOTE — Patient Instructions (Signed)
You are doing a great job with your blood pressure and diabetes management keep up the good work. We will try to schedule your mammogram, and look out for a letter for [facility] dates [facility]

## 2013-03-11 NOTE — Assessment & Plan Note (Signed)
Continue Crestor Check Lipid panel at next visit.

## 2013-03-11 NOTE — Assessment & Plan Note (Signed)
Lab Results  Component Value Date   HGBA1C 6.6 03/10/2013   HGBA1C 7.3 12/05/2012   HGBA1C 14.5* 08/30/2012     Assessment: Diabetes control: good control (HgbA1C at goal) Progress toward A1C goal:  at goal Comments:   Plan: Medications:  Metformin 1000mg  BID, Novolin 70/30 30 units BID Home glucose monitoring: Frequency: 2 times a day Timing: before breakfast;before dinner Instruction/counseling given: reminded to bring blood glucose meter & log to each visit, reminded to bring medications to each visit and discussed foot care Educational resources provided:   Self management tools provided:   Other plans: Diabetes health maintance up to date. Patient's last LDL 116 5 months ago and has been started on Crestor.  Cost is a concern, will repeat Lipid panel at next visit to check if she is now at goal.

## 2013-03-11 NOTE — Assessment & Plan Note (Addendum)
Hemmoccult cards negative in Jan. 15. Needs pap smear, instructed to look out for our upcoming Pap smear clinic. Mammogram ordered.

## 2013-03-11 NOTE — Assessment & Plan Note (Addendum)
BP Readings from Last 3 Encounters:  03/10/13 138/88  01/13/13 155/82  12/05/12 131/71    Lab Results  Component Value Date   NA 141 12/05/2012   K 3.9 12/05/2012   CREATININE 0.77 12/05/2012    Assessment: Blood pressure control: controlled Progress toward BP goal:  at goal Comments: [id number] Goal BP <140/90  Plan: Medications:  Benicar 20mg , Toprolol XL 100mg  daily Educational resources provided:   Self management tools provided: instructions for home blood pressure monitoring Other plans: None

## 2013-03-13 NOTE — Progress Notes (Signed)
Case discussed with Dr. Hoffman at the time of the visit.  We reviewed the resident's history and exam and pertinent patient test results.  I agree with the assessment, diagnosis, and plan of care documented in the resident's note. 

## 2013-03-16 ENCOUNTER — Ambulatory Visit (HOSPITAL_COMMUNITY)
Admission: RE | Admit: 2013-03-16 | Discharge: 2013-03-16 | Disposition: A | Discharge status: Home / Self Care | Payer: No Typology Code available for payment source | Source: Ambulatory Visit | Attending: Internal Medicine | Admitting: Internal Medicine

## 2013-03-16 DIAGNOSIS — Z0000 Encounter for general adult medical examination without abnormal findings: Secondary | ICD-10-CM

## 2013-03-16 DIAGNOSIS — Z1231 Encounter for screening mammogram for malignant neoplasm of breast: Secondary | ICD-10-CM | POA: Insufficient documentation

## 2013-03-23 ENCOUNTER — Telehealth: Payer: Self-pay | Admitting: *Deleted

## 2013-03-23 NOTE — Telephone Encounter (Signed)
Based on ASCVD risk she should be on 5 mg, not 20 mg. Thanks!

## 2013-03-23 NOTE — Telephone Encounter (Signed)
Pt was started on Crestor 5 mg on 01/13/13, received message from [facility] stating had been on Crestor 20 mg!  Rx written 12/05/12 # 90 With 3 refills. Do you want to cut back on med?

## 2013-03-27 NOTE — Telephone Encounter (Signed)
[facility] informed 

## 2013-05-26 ENCOUNTER — Other Ambulatory Visit: Payer: Self-pay | Admitting: *Deleted

## 2013-05-26 DIAGNOSIS — J309 Allergic rhinitis, unspecified: Secondary | ICD-10-CM

## 2013-05-26 MED ORDER — MOMETASONE FUROATE 50 MCG/ACT NA SUSP: 2.0000 | Freq: Every day | NASAL | Status: DC

## 2013-06-02 NOTE — Telephone Encounter (Signed)
Called to pharm 

## 2013-07-05 ENCOUNTER — Telehealth: Payer: Self-pay | Admitting: *Deleted

## 2013-07-05 NOTE — Telephone Encounter (Signed)
[facility] Crestor. Request was sent to pharmacy for review.  Reference number is [id number].  Will have answer within 24 hours.  Pt may need to try another Statin other than Pravachol.  Angelina OkGladys Zienna Ahlin, RN 07/05/2013 3:21 PM.

## 2013-07-20 NOTE — Telephone Encounter (Signed)
Request was approved from 07/05/2013-06/30/2014.Kingsley SpittleGoldston, Jillisa Harris Cassady7/16/20152:58 PM

## 2013-08-09 ENCOUNTER — Ambulatory Visit: Payer: Medicaid Other

## 2013-09-05 ENCOUNTER — Other Ambulatory Visit: Payer: Self-pay | Admitting: *Deleted

## 2013-09-05 DIAGNOSIS — I10 Essential (primary) hypertension: Secondary | ICD-10-CM

## 2013-09-05 DIAGNOSIS — Z8639 Personal history of other endocrine, nutritional and metabolic disease: Principal | ICD-10-CM

## 2013-09-05 DIAGNOSIS — Z862 Personal history of diseases of the blood and blood-forming organs and certain disorders involving the immune mechanism: Secondary | ICD-10-CM

## 2013-09-06 MED ORDER — OLMESARTAN MEDOXOMIL 20 MG PO TABS: 20.0000 mg | ORAL_TABLET | Freq: Every day | ORAL | Status: DC

## 2013-09-08 ENCOUNTER — Encounter: Payer: Medicaid Other | Admitting: Internal Medicine

## 2013-09-20 ENCOUNTER — Telehealth: Payer: Self-pay | Admitting: *Deleted

## 2013-09-20 NOTE — Telephone Encounter (Signed)
Received PA request from pt's pharmacy for her Benicar.  Medication is non-preferred by medicaid.  Preferred ARBs are Diovan and losartan ,which still requires trial and failure of an ACE.  Contacted pt to to ask about prior medications she been on in the past and informed me that she has stopped taking the Benicar because it was making her "sick".  Pt has a f/u appt in 2 days and will discuss alternative medications at that time.Criss Alvine, Ketina Mars Cassady9/16/201511:08 AM

## 2013-09-22 ENCOUNTER — Ambulatory Visit (INDEPENDENT_AMBULATORY_CARE_PROVIDER_SITE_OTHER): Payer: Medicaid Other | Admitting: Internal Medicine

## 2013-09-22 ENCOUNTER — Encounter: Payer: Self-pay | Admitting: Internal Medicine

## 2013-09-22 ENCOUNTER — Encounter: Payer: Medicaid Other | Admitting: Internal Medicine

## 2013-09-22 VITALS — BP 182/92 | HR 75 | Temp 98.0°F | Wt 206.0 lb

## 2013-09-22 DIAGNOSIS — I10 Essential (primary) hypertension: Secondary | ICD-10-CM

## 2013-09-22 DIAGNOSIS — E785 Hyperlipidemia, unspecified: Secondary | ICD-10-CM

## 2013-09-22 DIAGNOSIS — E119 Type 2 diabetes mellitus without complications: Secondary | ICD-10-CM

## 2013-09-22 DIAGNOSIS — Z23 Encounter for immunization: Secondary | ICD-10-CM

## 2013-09-22 DIAGNOSIS — Z8639 Personal history of other endocrine, nutritional and metabolic disease: Principal | ICD-10-CM

## 2013-09-22 DIAGNOSIS — J309 Allergic rhinitis, unspecified: Secondary | ICD-10-CM

## 2013-09-22 DIAGNOSIS — Z0000 Encounter for general adult medical examination without abnormal findings: Secondary | ICD-10-CM

## 2013-09-22 DIAGNOSIS — Z862 Personal history of diseases of the blood and blood-forming organs and certain disorders involving the immune mechanism: Secondary | ICD-10-CM

## 2013-09-22 LAB — COMPLETE METABOLIC PANEL WITH GFR
ALT: 26 U/L (ref 0–35)
AST: 25 U/L (ref 0–37)
Albumin: 4.3 g/dL (ref 3.5–5.2)
Alkaline Phosphatase: 69 U/L (ref 39–117)
BUN: 13 mg/dL (ref 6–23)
CALCIUM: 9.5 mg/dL (ref 8.4–10.5)
CO2: 28 meq/L (ref 19–32)
Chloride: 103 mEq/L (ref 96–112)
Creat: 0.84 mg/dL (ref 0.50–1.10)
GFR, EST AFRICAN AMERICAN: 85 mL/min
GFR, Est Non African American: 74 mL/min
GLUCOSE: 69 mg/dL — AB (ref 70–99)
Potassium: 4.1 mEq/L (ref 3.5–5.3)
SODIUM: 139 meq/L (ref 135–145)
TOTAL PROTEIN: 7 g/dL (ref 6.0–8.3)
Total Bilirubin: 0.4 mg/dL (ref 0.2–1.2)

## 2013-09-22 LAB — GLUCOSE, CAPILLARY: Glucose-Capillary: 65 mg/dL — ABNORMAL LOW (ref 70–99)

## 2013-09-22 LAB — POCT GLYCOSYLATED HEMOGLOBIN (HGB A1C): Hemoglobin A1C: 7.8

## 2013-09-22 MED ORDER — LISINOPRIL 20 MG PO TABS: 20.0000 mg | ORAL_TABLET | Freq: Every day | ORAL | Status: DC

## 2013-09-22 MED ORDER — MOMETASONE FUROATE 50 MCG/ACT NA SUSP: 2.0000 | Freq: Every day | NASAL | Status: DC

## 2013-09-22 MED ORDER — GLUCOSE BLOOD VI STRP: ORAL_STRIP | Status: DC

## 2013-09-22 MED ORDER — ACCU-CHEK FASTCLIX LANCETS MISC: Status: DC

## 2013-09-22 MED ORDER — CETIRIZINE HCL 10 MG PO TABS: 10.0000 mg | ORAL_TABLET | Freq: Every day | ORAL | Status: DC

## 2013-09-22 MED ORDER — ACCU-CHEK NANO SMARTVIEW W/DEVICE KIT: PACK | Status: DC

## 2013-09-22 MED ORDER — INSULIN NPH ISOPHANE & REGULAR (70-30) 100 UNIT/ML ~~LOC~~ SUSP: SUBCUTANEOUS | Status: DC

## 2013-09-22 NOTE — Patient Instructions (Addendum)
-  Start taking lisinopril 10 mg daily for your blood pressure  -Will order your diabetic testing supplies, please check your glucose three times daily -Will refer you for an eye exam  -Take zyrtec 10 mg daily for allergies, I refilled your nasonex -Will see you back in 1 month, please bring your meter so we can adjust your insulin, for now take 32 U twice a day  General Instructions:   Please bring your medicines with you each time you come to clinic.  Medicines may include prescription medications, over-the-counter medications, herbal remedies, eye drops, vitamins, or other pills.   Progress Toward Treatment Goals:  Treatment Goal 03/10/2013  Hemoglobin A1C at goal  Blood pressure at goal    Self Care Goals & Plans:  Self Care Goal 03/10/2013  Manage my medications take my medicines as prescribed; bring my medications to every visit; refill my medications on time  Monitor my health keep track of my blood glucose; bring my glucose meter and log to each visit; check my feet daily  Eat healthy foods eat baked foods instead of fried foods; eat foods that are low in salt; eat smaller portions; drink diet soda or water instead of juice or soda  Be physically active find an activity I enjoy  Meeting treatment goals maintain the current self-care plan    Home Blood Glucose Monitoring 03/10/2013  Check my blood sugar 2 times a day  When to check my blood sugar before breakfast; before dinner     Care Management & Community Referrals:  Referral 03/10/2013  Referrals made for care management support none needed  Referrals made to community resources none

## 2013-09-22 NOTE — Progress Notes (Signed)
Patient ID: Destiny Russell, female   DOB: 07/01/1950, 63 y.o.   MRN: [id number]    Subjective:   Patient ID: Destiny Russell female   DOB: Dec 05, 1950 63 y.o.   MRN: [id number]  HPI: Destiny Russell is a 63 y.o. very pleasant women with history of insulin-dependent Type 2 DMs, hypertension, hyperlipidemia, GERD, and allergic rhinitis who presents for routine clinic visit.   Pt with last A1c of 6.6 on 03/10/13. She has been taking Novolin 70/30 32U BID instead of 30 U BID for the past month due to elevated glucose values and metformin 1000 mg BID (with no AE's). She does not have a glucose meter but sometimes uses her son's and reports it has been in the range of 80-200's. She has symptomatic hypoglycemia if she skips meals. She has chronic polyuria, polydipsia, polyphagia, blurry vision, and neuropathy. She denies foot injury. She is due for an annual eye exam. She reports she does not always follow a low carbohydrate diet but does exercise. Her weight has been stable.    She has not been taking olmesartan for the past 2-3 months because it made her sick with nausea and dehydration. She is compliant with taking metoprolol succinate. She has chronic blurry vision but denies headache, palpitations, chest pain, or lightheadedness. She denies increased dietary salt intake.   She has year round allergic rhinitis and is on nasonex and generic OTC allergy medication containing benadryl. She has never tried zyrtec in the past.    She would like to receive a flu shot today. She denies fever or chills.    Past Medical History  Diagnosis Date  . Parkinson's disease   . Diabetes mellitus without complication   . Hypertension   . Shortness of breath   . GERD (gastroesophageal reflux disease)    Current Outpatient Prescriptions  Medication Sig Dispense Refill  . aspirin EC 81 MG tablet Take 81 mg by mouth daily.      . insulin NPH-regular (NOVOLIN 70/30) (70-30) 100 UNIT/ML injection Take 30 Units  in the morning and 30 Units at night  30 mL  11  . metFORMIN (GLUCOPHAGE) 1000 MG tablet Take 1 tablet (1,000 mg total) by mouth 2 (two) times daily with a meal.  180 tablet  3  . metoprolol succinate (TOPROL XL) 100 MG 24 hr tablet Take 1 tablet (100 mg total) by mouth daily. Take with or immediately following a meal.  90 tablet  4  . mometasone (NASONEX) 50 MCG/ACT nasal spray Place 2 sprays into the nose daily.  51 g  4  . olmesartan (BENICAR) 20 MG tablet Take 1 tablet (20 mg total) by mouth daily.  90 tablet  3  . rosuvastatin (CRESTOR) 5 MG tablet Take 1 tablet (5 mg total) by mouth daily.  90 tablet  4   No current facility-administered medications for this visit.   Family History  Problem Relation Age of Onset  . Hypertension Mother   . Hypertension Father    History   Social History  . Marital Status: Married    Spouse Name: N/A    Number of Children: N/A  . Years of Education: 9th   Social History Main Topics  . Smoking status: Never Smoker   . Smokeless tobacco: Current User    Types: Snuff, Chew  . Alcohol Use: No  . Drug Use: No  . Sexual Activity: Not on file   Other Topics Concern  . Not on file  Social History Narrative  . No narrative on file   Review of Systems: Review of Systems  Constitutional: Negative for fever, chills and weight loss.  HENT: Positive for congestion (chronic).   Eyes: Positive for blurred vision (chronic).  Respiratory: Positive for shortness of breath (occasionally). Negative for cough and wheezing.   Cardiovascular: Positive for leg swelling (trace). Negative for chest pain.  Gastrointestinal: Positive for constipation (chronic). Negative for nausea, vomiting, abdominal pain, diarrhea and blood in stool.  Genitourinary: Negative for dysuria, urgency, frequency and hematuria.       Chronic polyuria  Musculoskeletal: Positive for neck pain.  Neurological: Positive for sensory change (chronic peripheral neuropathy). Negative for  dizziness and headaches.  Endo/Heme/Allergies: Positive for environmental allergies and polydipsia (chronic).    Objective:  Physical Exam: Filed Vitals:   09/22/13 1530  BP: 182/92  Pulse: 75  Temp: 98 F (36.7 C)  TempSrc: Oral  Weight: 206 lb (93.441 kg)  SpO2: 98%    Physical Exam  Constitutional: She is oriented to person, place, and time. She appears well-developed and well-nourished. No distress.  HENT:  Head: Normocephalic and atraumatic.  Right Ear: External ear normal.  Left Ear: External ear normal.  Nose: Nose normal.  Mouth/Throat: Oropharynx is clear and moist. No oropharyngeal exudate.  Eyes: Conjunctivae and EOM are normal. Pupils are equal, round, and reactive to light. Right eye exhibits no discharge. Left eye exhibits no discharge. No scleral icterus.  Neck: Normal range of motion. Neck supple.  Cardiovascular: Normal rate and regular rhythm.   Pulmonary/Chest: Effort normal and breath sounds normal. No respiratory distress. She has no wheezes. She has no rales.  Abdominal: Soft. Bowel sounds are normal. She exhibits no distension. There is no tenderness. There is no rebound and no guarding.  Musculoskeletal: Normal range of motion. She exhibits edema (trace in b/l LE). She exhibits no tenderness.  Neurological: She is alert and oriented to person, place, and time.  Normal sensation to light touch of extremities   Skin: Skin is warm and dry. No rash noted. She is not diaphoretic. No erythema. No pallor.  Psychiatric: She has a normal mood and affect. Her behavior is normal. Judgment and thought content normal.    Assessment & Plan:   Please see problem list for problem-based assessment and plan

## 2013-09-23 MED ORDER — LISINOPRIL 10 MG PO TABS: 10.0000 mg | ORAL_TABLET | Freq: Every day | ORAL | Status: DC

## 2013-09-23 NOTE — Assessment & Plan Note (Addendum)
Assessment: Pt with moderately well controlled hypertension noncompliant with ARB therapy and compliant with BB anti-hypertensive therapy who presents with blood pressure of 182/92.   Plan:  -BP 182/92 not at goal <140/90 due to noncompliance with ARB therapy  -Prescribe lisinopril 10 mg daily (pt now has insurance) and up titrate at next visit, consider also starting HCTZ at next visit  -Continue metoprolol succinate 100 mg daily -Obtain CMP ---> normal

## 2013-09-23 NOTE — Assessment & Plan Note (Signed)
Assessment: Pt with last lipid panel on 10/07/12 with elevated LDL cholesterol noncompliant with statin therapy with ASCVD 10-yr risk of 22.3% with recommendations to start moderate to high intensity statin therapy.  Plan:  -Obtain annual lipid panel at next visit  -Pt declines starting statin therapy at this time, will inquire again at next visit -Encourage dietary modification

## 2013-09-23 NOTE — Assessment & Plan Note (Signed)
Assessment: Pt with perennial allergic rhinitis with moderately controlled symptoms on intranasal corticosteroid therapy and anti-histamine therapy.   Plan:  -Refill mometasone nasal spray (2 sprays in each nostril daily)   -Prescribe cetirizine 10 mg daily (pt currently taking OTC benadryl)

## 2013-09-23 NOTE — Assessment & Plan Note (Addendum)
Assessment: Pt with last A1c of 7.3 on 03/10/13 compliant with insulin and oral hypoglycemic therapy with recent symptomatic hypoglycemia who presents with CBG of 65 and worsened A1c of 7.8.    Plan:  -CBG 65 ---> pt given juice with improvement of glucose  -HbA1c 7.8 not at goal <7.0, Pt given diabetic testing supplies and to bring meter at next visit for insulin adjustment. Continue Novolin (70/30) 32 U BID (pt up titrated herself for past 1 month) and metformin 1000 mg BID.   -BP 182/92 not at goal <140/90, start lisinopril 10 mg daily and up titrate at next visit, consider also starting HCTZ at next visit. Continue metoprolol succinate 100 mg daily. -LDL 116 not at goal <100, pt no longer taking statin therapy and declines restarting at this time, obtain annual lipid panel at next visit  -Perform annual foot exam today -Last annual eye exam on 09/07/12, no referrals at this time due to insurance issue, possibly to perform retinal scanner at next clinic visit   -BMI 37.67 not at goal <25, encourage weight loss  -Continue daily 81 mg aspirin for primary CV protection  -Pt to return in 1 month

## 2013-09-23 NOTE — Assessment & Plan Note (Signed)
-  Pt received annual influenza vaccination today on 09/22/13.  

## 2013-09-25 NOTE — Progress Notes (Signed)
Case discussed with Dr. Rabbani soon after the resident saw the patient.  We reviewed the resident's history and exam and pertinent patient test results.  I agree with the assessment, diagnosis, and plan of care documented in the resident's note. 

## 2013-10-11 ENCOUNTER — Encounter: Payer: Self-pay | Admitting: *Deleted

## 2013-10-19 ENCOUNTER — Encounter: Payer: Medicaid Other | Admitting: Internal Medicine

## 2013-10-27 ENCOUNTER — Other Ambulatory Visit: Payer: Medicaid Other

## 2013-10-27 ENCOUNTER — Ambulatory Visit (INDEPENDENT_AMBULATORY_CARE_PROVIDER_SITE_OTHER): Payer: Medicaid Other | Admitting: Internal Medicine

## 2013-10-27 ENCOUNTER — Encounter: Payer: Self-pay | Admitting: Internal Medicine

## 2013-10-27 VITALS — BP 178/83 | HR 72 | Temp 97.9°F | Wt 203.2 lb

## 2013-10-27 DIAGNOSIS — K219 Gastro-esophageal reflux disease without esophagitis: Secondary | ICD-10-CM

## 2013-10-27 DIAGNOSIS — E785 Hyperlipidemia, unspecified: Secondary | ICD-10-CM

## 2013-10-27 DIAGNOSIS — I10 Essential (primary) hypertension: Secondary | ICD-10-CM

## 2013-10-27 DIAGNOSIS — J309 Allergic rhinitis, unspecified: Secondary | ICD-10-CM

## 2013-10-27 DIAGNOSIS — E1140 Type 2 diabetes mellitus with diabetic neuropathy, unspecified: Secondary | ICD-10-CM

## 2013-10-27 LAB — HM DIABETES EYE EXAM

## 2013-10-27 MED ORDER — LISINOPRIL-HYDROCHLOROTHIAZIDE 20-12.5 MG PO TABS: 1.0000 | ORAL_TABLET | Freq: Every day | ORAL | Status: DC

## 2013-10-27 NOTE — Patient Instructions (Signed)
-  Start taking prinzide 20-12.5 mg daily, stop taking lisinopril 10 mg daily -Will check your blood work today -Will see you back in 1 month, nice seeing you today!  General Instructions:   Please bring your medicines with you each time you come to clinic.  Medicines may include prescription medications, over-the-counter medications, herbal remedies, eye drops, vitamins, or other pills.   Progress Toward Treatment Goals:  Treatment Goal 03/10/2013  Hemoglobin A1C at goal  Blood pressure at goal    Self Care Goals & Plans:  Self Care Goal 10/27/2013  Manage my medications take my medicines as prescribed; bring my medications to every visit  Monitor my health keep track of my blood glucose; bring my glucose meter and log to each visit; check my feet daily; keep track of my blood pressure  Eat healthy foods eat foods that are low in salt; eat baked foods instead of fried foods  Be physically active find an activity I enjoy  Meeting treatment goals -    Home Blood Glucose Monitoring 03/10/2013  Check my blood sugar 2 times a day  When to check my blood sugar before breakfast; before dinner     Care Management & Community Referrals:  Referral 03/10/2013  Referrals made for care management support none needed  Referrals made to community resources none

## 2013-10-27 NOTE — Progress Notes (Signed)
Patient ID: Destiny Russell, female   DOB: Oct 04, 1950, 63 y.o.   MRN: [id number]    Subjective:   Patient ID: Destiny Russell female   DOB: 02/11/1950 63 y.o.   MRN: [id number]  HPI: [name] is a 63 y.o. very pleasant women with history of insulin-dependent Type 2 DM, hypertension, hyperlipidemia, GERD, and allergic rhinitis who presents for follow-up visit of hypertension.   She has been compliant with newly started lisinopril 10 mg daily in addition to metoprolol succinate. She has chronic blurry vision but denies headache, palpitations, chest pain, or lightheadedness. She denies increased dietary salt intake.    Her last A1c was 7.8 on  09/22/13. She is compliant with taking Novolin 70/30 32U BID and metformin 1000 mg BID. She has been checking her blood glucose 1-3 times daily and her glucose meter reveals average of 124 with range of 40-320. She continues to have symptomatic hypoglycemia if she skips meals. She has chronic polyuria, polydipsia, polyphagia, blurry vision, and neuropathy. She denies foot injury. She is due for an annual eye exam. She tries to follow a low carbohydrate diet and exercise reguarly. She has lost 3 lbs sine last visit in September.    She reports her allergic rhinitis symptoms have improved since taking zyrtec daily. She is also compliant with using nasonex nasal spray.     She reports having acid reflux symptoms without dysphagia, odynophagia, or melena. She is not currently on medical therapy and would like to try dietary modification instead.     Past Medical History  Diagnosis Date  . Parkinson's disease   . Diabetes mellitus without complication   . Hypertension   . Shortness of breath   . GERD (gastroesophageal reflux disease)    Current Outpatient Prescriptions  Medication Sig Dispense Refill  . ACCU-CHEK FASTCLIX LANCETS MISC Use to test blood glucose 3 times daily. Dx: 250.00  102 each  12  . aspirin EC 81 MG tablet Take 81 mg by mouth  daily.      . Blood Glucose Monitoring Suppl (ACCU-CHEK NANO SMARTVIEW) W/DEVICE KIT Use to test blood glucose 3 times daily. Dx:250.00  1 kit  0  . cetirizine (ZYRTEC ALLERGY) 10 MG tablet Take 1 tablet (10 mg total) by mouth daily.  30 tablet  3  . glucose blood (ACCU-CHEK SMARTVIEW) test strip Use to test blood glucose 3 times daily. Dx: 250.00  100 each  12  . insulin NPH-regular Human (NOVOLIN 70/30) (70-30) 100 UNIT/ML injection Take 30 Units in the morning and 30 Units at night  30 mL  11  . lisinopril (PRINIVIL,ZESTRIL) 10 MG tablet Take 1 tablet (10 mg total) by mouth daily.  30 tablet  2  . metFORMIN (GLUCOPHAGE) 1000 MG tablet Take 1 tablet (1,000 mg total) by mouth 2 (two) times daily with a meal.  180 tablet  3  . metoprolol succinate (TOPROL XL) 100 MG 24 hr tablet Take 1 tablet (100 mg total) by mouth daily. Take with or immediately following a meal.  90 tablet  4  . mometasone (NASONEX) 50 MCG/ACT nasal spray Place 2 sprays into the nose daily.  51 g  4   No current facility-administered medications for this visit.   Family History  Problem Relation Age of Onset  . Hypertension Mother   . Hypertension Father    History   Social History  . Marital Status: Married    Spouse Name: N/A    Number of Children: N/A  .  Years of Education: 9th   Social History Main Topics  . Smoking status: Never Smoker   . Smokeless tobacco: Current User    Types: Snuff, Chew  . Alcohol Use: No  . Drug Use: No  . Sexual Activity: None   Other Topics Concern  . None   Social History Narrative  . None   Review of Systems: Review of Systems  Constitutional: Positive for weight loss (intentional ). Negative for fever and chills.  HENT: Positive for congestion (due to AR, improved). Negative for sore throat.   Eyes: Positive for blurred vision (chronic).  Respiratory: Positive for cough (possibly due to GERD).   Cardiovascular: Negative for chest pain, palpitations and leg swelling.    Gastrointestinal: Positive for heartburn. Negative for nausea, vomiting, abdominal pain, diarrhea, constipation, blood in stool and melena.  Genitourinary: Negative for dysuria, urgency and frequency.       Chronic polyuria  Musculoskeletal: Negative for myalgias.  Neurological: Positive for sensory change (chronic peripheral neuropathy). Negative for dizziness and headaches.  Endo/Heme/Allergies: Positive for environmental allergies (imrpoved) and polydipsia (chronic).    Objective:  Physical Exam: Filed Vitals:   10/27/13 1459  BP: 178/83  Pulse: 72  Temp: 97.9 F (36.6 C)  TempSrc: Oral  Weight: 203 lb 3.2 oz (92.171 kg)  SpO2: 99%   Physical Exam  Constitutional: She is oriented to person, place, and time. She appears well-developed and well-nourished. No distress.  HENT:  Head: Normocephalic and atraumatic.  Eyes: EOM are normal.  Neck: Normal range of motion. Neck supple.  Cardiovascular: Normal rate, regular rhythm and normal heart sounds.   Pulmonary/Chest: Effort normal and breath sounds normal. No respiratory distress. She has no wheezes. She has no rales.  Abdominal: Soft. Bowel sounds are normal. She exhibits no distension. There is no tenderness. There is no rebound and no guarding.  Musculoskeletal: Normal range of motion. She exhibits no edema and no tenderness.  Neurological: She is alert and oriented to person, place, and time.  Skin: Skin is warm and dry. No rash noted. She is not diaphoretic. No erythema. No pallor.  Psychiatric: She has a normal mood and affect. Her behavior is normal. Judgment and thought content normal.    Assessment & Plan:   Please see problem list for problem-based assessment and plan

## 2013-10-28 DIAGNOSIS — K219 Gastro-esophageal reflux disease without esophagitis: Secondary | ICD-10-CM | POA: Insufficient documentation

## 2013-10-28 LAB — BASIC METABOLIC PANEL WITH GFR
BUN: 12 mg/dL (ref 6–23)
CHLORIDE: 105 meq/L (ref 96–112)
CO2: 24 mEq/L (ref 19–32)
Calcium: 9.5 mg/dL (ref 8.4–10.5)
Creat: 0.77 mg/dL (ref 0.50–1.10)
GFR, Est Non African American: 82 mL/min
GLUCOSE: 94 mg/dL (ref 70–99)
Potassium: 4 mEq/L (ref 3.5–5.3)
Sodium: 141 mEq/L (ref 135–145)

## 2013-10-28 LAB — LIPID PANEL
Cholesterol: 184 mg/dL (ref 0–200)
HDL: 48 mg/dL (ref >39)
LDL CALC: 119 mg/dL — AB (ref 0–99)
Total CHOL/HDL Ratio: 3.8 Ratio
Triglycerides: 85 mg/dL (ref <150)
VLDL: 17 mg/dL (ref 0–40)

## 2013-10-28 LAB — MICROALBUMIN / CREATININE URINE RATIO
Creatinine, Urine: 21.7 mg/dL
MICROALB UR: 0.3 mg/dL (ref <2.0)
Microalb Creat Ratio: 13.8 mg/g (ref 0.0–30.0)

## 2013-10-28 NOTE — Assessment & Plan Note (Addendum)
Assessment: Pt with last A1c of 7.3 on 03/10/13 compliant with insulin and oral hypoglycemic therapy with recent symptomatic hypoglycemia due to skipping meals who presents with blood glucose of 94.   Plan:  -A1c 7.8 not at goal <7.0,  Pt counseled on not skipping meals. Consider changing Novolin (70/30) to Novolog 70/30 at next visit. Continue Novolin (70/30) 32 U BID and metformin 1000 mg BID.  -BP 178/83 not at goal <140/90, discontinue lisinopril 10 mg daily and prescribe lisinopril-HCTZ 20-12.5 mg. Continue metoprolol succinate 100 mg daily  -Obtain annual lipid panel, LDL 119 not at goal <100, pt declined starting statin therapy at this time   -Last annual foot exam on 09/22/13 -Perform annual eye exam (retinal scanner) today   -Obtain annual urine microalbumin test ----> normal  -BMI 37.16 not at goal <25, encourage weight loss  -Continue daily 81 mg aspirin for primary CVD protection

## 2013-10-28 NOTE — Assessment & Plan Note (Addendum)
Assessment: Pt with poorly controlled hypertension compliant with two-class (ACEi & BB) anti-hypertensive therapy who presents with blood pressure of 178/83.   Plan:  -BP 178/83 not at goal <140/90   -Discontinue lisinopril 10 mg daily and prescribe lisinopril-HCTZ 20-12.5 mg   -Continue metoprolol succinate 100 mg daily  -Obtain BMP ---> normal  -Pt to return in 1 month for blood pressure recheck

## 2013-10-28 NOTE — Assessment & Plan Note (Signed)
Assessment: Pt with acid reflux symptoms not currently on medical therapy who presents with no alarm symptoms.   Plan:  -Pt declined starting PPI therapy at this time  -Pt encouraged to avoid food triggers and lying down immediately after eating -Monitor for alarm symptoms warranting EGD

## 2013-10-28 NOTE — Assessment & Plan Note (Signed)
Assessment: Pt with last lipid panel on 10/07/12 with elevated LDL cholesterol not currently on statin therapy with ASCVD 10-yr risk of 23.4% with recommendations to start moderate to high intensity statin therapy.   Plan:  -Obtain annual lipid panel --> LDL worsened from 116 to 119 -Pt declines starting statin therapy at this time, will inquire again at next visit  -Encourage dietary modification

## 2013-10-28 NOTE — Assessment & Plan Note (Signed)
Assessment: Pt with perennial allergic rhinitis compliant with intranasal corticosteroid therapy and newly started anti-histamine therapy who presents with improved symptoms.   Plan:  -Continue mometasone nasal spray (2 sprays in each nostril daily) and cetirizine 10 mg daily

## 2013-10-31 NOTE — Progress Notes (Signed)
[facility] Attending  Case discussed with Dr. Rabbani soon after the resident saw the patient.  We reviewed the resident's history and exam and pertinent patient test results.  I agree with the assessment, diagnosis, and plan of care documented in the resident's note.  

## 2013-11-06 ENCOUNTER — Encounter: Payer: Self-pay | Admitting: *Deleted

## 2013-11-22 ENCOUNTER — Encounter: Payer: Self-pay | Admitting: Internal Medicine

## 2013-11-24 ENCOUNTER — Encounter: Payer: Self-pay | Admitting: Internal Medicine

## 2013-11-24 ENCOUNTER — Ambulatory Visit (INDEPENDENT_AMBULATORY_CARE_PROVIDER_SITE_OTHER): Payer: Medicaid Other | Admitting: Internal Medicine

## 2013-11-24 VITALS — BP 152/78 | HR 74 | Temp 97.9°F | Wt 203.3 lb

## 2013-11-24 DIAGNOSIS — E11329 Type 2 diabetes mellitus with mild nonproliferative diabetic retinopathy without macular edema: Secondary | ICD-10-CM

## 2013-11-24 DIAGNOSIS — K219 Gastro-esophageal reflux disease without esophagitis: Secondary | ICD-10-CM

## 2013-11-24 DIAGNOSIS — I10 Essential (primary) hypertension: Secondary | ICD-10-CM

## 2013-11-24 DIAGNOSIS — E785 Hyperlipidemia, unspecified: Secondary | ICD-10-CM

## 2013-11-24 DIAGNOSIS — E113299 Type 2 diabetes mellitus with mild nonproliferative diabetic retinopathy without macular edema, unspecified eye: Secondary | ICD-10-CM

## 2013-11-24 MED ORDER — LISINOPRIL-HYDROCHLOROTHIAZIDE 20-12.5 MG PO TABS: 1.0000 | ORAL_TABLET | Freq: Two times a day (BID) | ORAL | Status: DC

## 2013-11-24 MED ORDER — ESOMEPRAZOLE MAGNESIUM 20 MG PO CPDR: 20.0000 mg | DELAYED_RELEASE_CAPSULE | Freq: Every day | ORAL | Status: DC

## 2013-11-24 MED ORDER — INSULIN NPH ISOPHANE & REGULAR (70-30) 100 UNIT/ML ~~LOC~~ SUSP: SUBCUTANEOUS | Status: DC

## 2013-11-24 MED ORDER — PRAVASTATIN SODIUM 40 MG PO TABS: 40.0000 mg | ORAL_TABLET | Freq: Every evening | ORAL | Status: DC

## 2013-11-24 NOTE — Progress Notes (Signed)
Patient ID: Destiny Russell, female   DOB: 05-19-1950, 63 y.o.   MRN: [id number]    Subjective:   Patient ID: Destiny Russell female   DOB: [birth year] 63 y.o.   MRN: [id number]  HPI: Destiny Russell is a 63 y.o. very pleasant women with history of insulin-dependent Type 2 DM, hypertension, hyperlipidemia, GERD, and allergic rhinitis who presents for follow-up visit of hypertension.   She has been compliant with taking lisinopril-HCTZ and metoprolol succinate for hypertension. She has chronic blurry vision and occasional lightheadedness but denies headache, palpitations, or chest pain.   Her last A1c was 7.8 on9/18/15. She is compliant with taking Novolin 70/30 32U BID and metformin 1000 mg BID. She has been checking her blood glucose 1-3 times daily and her glucose meter reveals average of 126 with range of 59-310. She continues to have symptomatic hypoglycemia about three times a week. She has chronic polyuria, polydipsia, polyphagia, blurry vision, and neuropathy. She denies foot injury. Her last annual eye exam revealed right mild NPDR. She tries to follow a low carbohydrate diet and exercise as much as she can. Her weight remains stable from last visit one month ago.    Her last lipid panel on 10/27/13 revealed hypercholesteremia with LDL 119. She declined starting medical therapy at prior visits but today is agreeable.   She reports having acid reflux symptoms without dysphagia, odynophagia, or melena. She occasionally has coughing after eating. She has not been on PPI therapy in the past but agreeable in starting today.     Past Medical History  Diagnosis Date  . Parkinson's disease   . Diabetes mellitus without complication   . Hypertension   . Shortness of breath   . GERD (gastroesophageal reflux disease)    Current Outpatient Prescriptions  Medication Sig Dispense Refill  . ACCU-CHEK FASTCLIX LANCETS MISC Use to test blood glucose 3 times daily. Dx: 250.00 102 each 12    . aspirin EC 81 MG tablet Take 81 mg by mouth daily.    . Blood Glucose Monitoring Suppl (ACCU-CHEK NANO SMARTVIEW) W/DEVICE KIT Use to test blood glucose 3 times daily. Dx:250.00 1 kit 0  . cetirizine (ZYRTEC ALLERGY) 10 MG tablet Take 1 tablet (10 mg total) by mouth daily. 30 tablet 3  . glucose blood (ACCU-CHEK SMARTVIEW) test strip Use to test blood glucose 3 times daily. Dx: 250.00 100 each 12  . insulin NPH-regular Human (NOVOLIN 70/30) (70-30) 100 UNIT/ML injection Take 30 Units in the morning and 30 Units at night 30 mL 11  . lisinopril-hydrochlorothiazide (PRINZIDE) 20-12.5 MG per tablet Take 1 tablet by mouth daily. 30 tablet 3  . metFORMIN (GLUCOPHAGE) 1000 MG tablet Take 1 tablet (1,000 mg total) by mouth 2 (two) times daily with a meal. 180 tablet 3  . metoprolol succinate (TOPROL XL) 100 MG 24 hr tablet Take 1 tablet (100 mg total) by mouth daily. Take with or immediately following a meal. 90 tablet 4  . mometasone (NASONEX) 50 MCG/ACT nasal spray Place 2 sprays into the nose daily. 51 g 4   No current facility-administered medications for this visit.   Family History  Problem Relation Age of Onset  . Hypertension Mother   . Hypertension Father    History   Social History  . Marital Status: Married    Spouse Name: N/A    Number of Children: N/A  . Years of Education: 9th   Social History Main Topics  . Smoking status: Never Smoker   .  Smokeless tobacco: Current User    Types: Snuff, Chew  . Alcohol Use: No  . Drug Use: No  . Sexual Activity: None   Other Topics Concern  . None   Social History Narrative   Review of Systems: Review of Systems  Constitutional: Negative for fever, chills and weight loss.  HENT:       No dysphagia or odynophagia  Eyes: Positive for blurred vision (chronic ).  Respiratory: Positive for cough (after eating). Negative for shortness of breath and wheezing.   Cardiovascular: Negative for chest pain and leg swelling.   Gastrointestinal: Positive for heartburn and constipation. Negative for nausea, vomiting, abdominal pain, diarrhea and blood in stool.  Genitourinary: Negative for dysuria, urgency, frequency and hematuria.       Chronic polyuria   Musculoskeletal: Negative for myalgias.  Neurological: Positive for dizziness (occasional lightheadedness ) and sensory change (chronic peripheral neuropathy). Negative for headaches.  Endo/Heme/Allergies: Positive for polydipsia (chronic).     Objective:  Physical Exam: Filed Vitals:   11/24/13 1546  BP: 152/78  Pulse: 74  Temp: 97.9 F (36.6 C)  TempSrc: Oral  Weight: 203 lb 4.8 oz (92.216 kg)  SpO2: 98%   Physical Exam  Constitutional: She is oriented to person, place, and time. She appears well-developed and well-nourished. No distress.  HENT:  Head: Normocephalic and atraumatic.  Eyes: EOM are normal.  Neck: Normal range of motion. Neck supple.  Cardiovascular: Normal rate, regular rhythm and normal heart sounds.   Pulmonary/Chest: Effort normal and breath sounds normal. No respiratory distress. She has no wheezes. She has no rales.  Abdominal: Soft. Bowel sounds are normal. She exhibits no distension. There is no tenderness. There is no rebound and no guarding.  Musculoskeletal: Normal range of motion. She exhibits no edema or tenderness.  Neurological: She is alert and oriented to person, place, and time.  Skin: Skin is warm and dry. No rash noted. She is not diaphoretic. No erythema. No pallor.  Psychiatric: She has a normal mood and affect. Her behavior is normal. Judgment and thought content normal.    Assessment & Plan:   Please see problem list for problem-based assessment and plan

## 2013-11-24 NOTE — Patient Instructions (Addendum)
-  Decrease your Novolin from 32 U twice daily to 28 U twice daily and keep checking your blood sugars -Take lisinopril-HCTZ twice a day instead of once daily -Start taking pravastatin 40 mg daily for high cholesterol  -Start taking nexium 20 mg daily for acid reflex -Will check your bloodwork today and see you back in 1 month!  General Instructions:   Thank you for bringing your medicines today. This helps us keep you safe from mistakes.   Progress Toward Treatment Goals:  Treatment Goal 03/10/2013  Hemoglobin A1C at goal  Blood pressure at goal    Self Care Goals & Plans:  Self Care Goal 10/27/2013  Manage my medications take my medicines as prescribed; bring my medications to every visit  Monitor my health keep track of my blood glucose; bring my glucose meter and log to each visit; check my feet daily; keep track of my blood pressure  Eat healthy foods eat foods that are low in salt; eat baked foods instead of fried foods  Be physically active find an activity I enjoy  Meeting treatment goals -    Home Blood Glucose Monitoring 03/10/2013  Check my blood sugar 2 times a day  When to check my blood sugar before breakfast; before dinner     Care Management & Community Referrals:  Referral 03/10/2013  Referrals made for care management support none needed  Referrals made to community resources none

## 2013-11-25 ENCOUNTER — Other Ambulatory Visit: Payer: Self-pay | Admitting: Internal Medicine

## 2013-11-25 LAB — BASIC METABOLIC PANEL WITH GFR
BUN: 12 mg/dL (ref 6–23)
CHLORIDE: 100 meq/L (ref 96–112)
CO2: 27 mEq/L (ref 19–32)
Calcium: 9.1 mg/dL (ref 8.4–10.5)
Creat: 0.79 mg/dL (ref 0.50–1.10)
GFR, EST NON AFRICAN AMERICAN: 79 mL/min
Glucose, Bld: 66 mg/dL — ABNORMAL LOW (ref 70–99)
POTASSIUM: 3.7 meq/L (ref 3.5–5.3)
SODIUM: 139 meq/L (ref 135–145)

## 2013-11-25 NOTE — Assessment & Plan Note (Addendum)
Assessment: Pt with last A1c of 7.8 on 09/22/13 compliant with insulin and oral hypoglycemic therapy with recent symptomatic hypoglycemia who presents with blood glucose of 66.   Plan:  -A1c 7.8 not at goal <7.0, [facility]. Will decrease Novolin (70/30) from 32 U BID to 28 U BID due to symptomatic hypoglycemia and continue metformin 1000 mg BID.  -BP 152/78 not at goal <140/90, increase lisinopril-HCTZ 20-12.5 mg daily to BID and continue metoprolol succinate 100 mg daily  -LDL 119 not at goal <100, pt agreeable in starting pravastatin 40 mg daily   -Last annual foot exam on 09/22/13 and annual eye exam (retinal scanner) on 10/27/13 with mild right NPDR  -Last annual urine microalbumin test on 10/27/13 was normal  -BMI 37.17 not at goal <25, encourage weight loss  -Continue daily 81 mg aspirin for primary CVD protection

## 2013-11-25 NOTE — Assessment & Plan Note (Addendum)
Assessment: Pt with last lipid panel on 10/27/13 with elevated LDL cholesterol not currently on statin therapy with ASCVD 10-yr risk of 23.4% with recommendations to start moderate to high intensity statin therapy.   Plan:  -LDL 119 not at goal <100 -Pt was previously not agreeable to starting statin therapy but today she is, will start moderate intensity statin, pravastatin 40 mg daily. -Last CMP on 09/22/13 with normal liver function -Monitor for myalgias

## 2013-11-25 NOTE — Assessment & Plan Note (Signed)
Assessment: Pt with acid reflux symptoms not currently on medical therapy who presents with no alarm symptoms.   Plan:  -Prescribe esomeprazole 20 mg daily   -Pt encouraged to avoid food triggers and lying down immediately after eating -Monitor for alarm symptoms warranting EGD

## 2013-11-25 NOTE — Assessment & Plan Note (Addendum)
Assessment: Pt with moderately-well controlled hypertension compliant with three-class (ACEi, diuretic, BB) anti-hypertensive therapy who presents with improved blood pressure of 152/78.   Plan:  -BP 152/78 not at goal <140/90  -Increase lisinopril-HCTZ 20-12.5 mg daily to BID   -Continue metoprolol succinate 100 mg daily  -Obtain BMP ---> CKD Stage 2 stable with Cr 0.79 and GFR 79  -Pt to return in 1 month

## 2013-11-27 NOTE — Progress Notes (Signed)
Medicine attending: Medical history, presenting problems, physical findings, and medications, reviewed with resident physician Dr. Otis BraceMarjan Rabbani and I concur with her evaluation and management plan

## 2013-11-29 ENCOUNTER — Telehealth: Payer: Self-pay | Admitting: *Deleted

## 2013-11-29 NOTE — Telephone Encounter (Signed)
As of Nov 2015-Brand name no longer covered, but generic is.   Rx changed to metoprolol succinate ER 100 mg- pharmacy and pt aware.Kingsley SpittleGoldston, Darlene Cassady11/25/20154:48 PM  Will send to pcp for signature.

## 2013-12-06 NOTE — Telephone Encounter (Signed)
Thanks for letting me know!   Dr. Linkin Vizzini 

## 2013-12-22 ENCOUNTER — Encounter: Payer: Self-pay | Admitting: Internal Medicine

## 2013-12-22 ENCOUNTER — Ambulatory Visit (INDEPENDENT_AMBULATORY_CARE_PROVIDER_SITE_OTHER): Payer: Medicaid Other | Admitting: Internal Medicine

## 2013-12-22 VITALS — BP 134/63 | HR 80 | Temp 97.9°F | Wt 208.0 lb

## 2013-12-22 DIAGNOSIS — Z7982 Long term (current) use of aspirin: Secondary | ICD-10-CM

## 2013-12-22 DIAGNOSIS — K219 Gastro-esophageal reflux disease without esophagitis: Secondary | ICD-10-CM

## 2013-12-22 DIAGNOSIS — E785 Hyperlipidemia, unspecified: Secondary | ICD-10-CM

## 2013-12-22 DIAGNOSIS — Z0000 Encounter for general adult medical examination without abnormal findings: Secondary | ICD-10-CM

## 2013-12-22 DIAGNOSIS — E113299 Type 2 diabetes mellitus with mild nonproliferative diabetic retinopathy without macular edema, unspecified eye: Secondary | ICD-10-CM

## 2013-12-22 DIAGNOSIS — E11329 Type 2 diabetes mellitus with mild nonproliferative diabetic retinopathy without macular edema: Secondary | ICD-10-CM

## 2013-12-22 DIAGNOSIS — I10 Essential (primary) hypertension: Secondary | ICD-10-CM

## 2013-12-22 DIAGNOSIS — Z794 Long term (current) use of insulin: Secondary | ICD-10-CM

## 2013-12-22 LAB — COMPLETE METABOLIC PANEL WITH GFR
ALT: 33 U/L (ref 0–35)
AST: 25 U/L (ref 0–37)
Albumin: 3.7 g/dL (ref 3.5–5.2)
Alkaline Phosphatase: 53 U/L (ref 39–117)
BUN: 16 mg/dL (ref 6–23)
CO2: 26 meq/L (ref 19–32)
CREATININE: 0.96 mg/dL (ref 0.50–1.10)
Calcium: 9.3 mg/dL (ref 8.4–10.5)
Chloride: 105 mEq/L (ref 96–112)
GFR, Est African American: 72 mL/min
GFR, Est Non African American: 63 mL/min
Glucose, Bld: 127 mg/dL — ABNORMAL HIGH (ref 70–99)
Potassium: 4.1 mEq/L (ref 3.5–5.3)
Sodium: 139 mEq/L (ref 135–145)
TOTAL PROTEIN: 6.4 g/dL (ref 6.0–8.3)
Total Bilirubin: 0.5 mg/dL (ref 0.2–1.2)

## 2013-12-22 LAB — POCT GLYCOSYLATED HEMOGLOBIN (HGB A1C): Hemoglobin A1C: 7.6

## 2013-12-22 LAB — CBC
HCT: 40.2 % (ref 36.0–46.0)
Hemoglobin: 13.6 g/dL (ref 12.0–15.0)
MCH: 30 pg (ref 26.0–34.0)
MCHC: 33.8 g/dL (ref 30.0–36.0)
MCV: 88.5 fL (ref 78.0–100.0)
MPV: 9.5 fL (ref 9.4–12.4)
PLATELETS: 226 10*3/uL (ref 150–400)
RBC: 4.54 MIL/uL (ref 3.87–5.11)
RDW: 13.5 % (ref 11.5–15.5)
WBC: 6.4 10*3/uL (ref 4.0–10.5)

## 2013-12-22 LAB — GLUCOSE, CAPILLARY: GLUCOSE-CAPILLARY: 125 mg/dL — AB (ref 70–99)

## 2013-12-22 NOTE — Assessment & Plan Note (Signed)
Assessment: Pt with last lipid panel on 10/27/13 with elevated LDL cholesterol compliant with newly started moderate-intensity with ASCVD 10-yr risk of 13.9% with recommendations to continue moderate to high intensity statin therapy.   Plan:  -Continue newly started pravastatin 40 mg daily -Obtain CMP  -Continue to monitor for myalgias

## 2013-12-22 NOTE — Assessment & Plan Note (Signed)
Assessment: Pt with acid reflux disease compliant with PPI therapy who presents with improved symptoms and no alarm symptoms.   Plan:  -Continue esomeprazole 20 mg daily  -Monitor for alarm symptoms warranting EGD

## 2013-12-22 NOTE — Assessment & Plan Note (Addendum)
-  Pt declined screening colonoscopy on 12/22/13, will inquire again at next visit.  -Pt to have zoster vaccination once 63 years old due to insurance  -Obtain CBC w/o diff

## 2013-12-22 NOTE — Assessment & Plan Note (Signed)
Assessment: Pt with last A1c of 7.8 on 09/22/13 compliant with insulin and oral hypoglycemic therapy with fewer episodes of symptomatic hypoglycemia who presents with blood glucose of 125 and improved A1c of 7.6.   Plan:  -A1c 7.6 not at goal <7.0,continue Novolin (70/30) 28 U BID and metformin 1000 mg BID. Pt instructed to eat meals at scheduled time to avoid symptomatic hypoglycemia.   -BP 134/63 at goal <140/90, continue lisinopril-HCTZ 20-12.5 mg BID and metoprolol succinate 100 mg daily  -LDL 119 not at goal <100, continue pravastatin 40 mg daily   -Last annual foot exam on 09/22/13 and annual eye exam (retinal scanner) on 10/27/13 with mild right NPDR  -Last annual urine microalbumin test on 10/27/13 was normal  -BMI 38.03 not at goal <25, encourage weight loss  -Continue daily 81 mg aspirin for primary CVD protection

## 2013-12-22 NOTE — Assessment & Plan Note (Signed)
Assessment: Pt with moderately-well controlled hypertension compliant with three-class (ACEi, diuretic, BB) anti-hypertensive therapy who presents with improved blood pressure of 134/63.   Plan:  -BP 134/63 at goal <140/90  -Continue lisinopril-HCTZ 20-12.5 mg BID and metoprolol succinate 100 mg daily  -Obtain CMP

## 2013-12-22 NOTE — Progress Notes (Signed)
Patient ID: Destiny Russell, female   DOB: [birth year], 63 y.o.   MRN: [id number]    Subjective:   Patient ID: Destiny Russell female   DOB: 03-23-1950 63 y.o.   MRN: [id number]  HPI: Destiny Russell is a 63 y.o. very pleasant women with history of CAD, insulin-dependent Type 2 DM, hypertension, hyperlipidemia, GERD, and allergic rhinitis who presents for follow-up visit of hypertension and diabetes.   She has been compliant with increased dosage of lisinopril-HCTZ and metoprolol succinate for hypertension. She has had lightheadedness that has improved in the past few days. She has chronic blurry vision and occasional headache but denies chest pain or LE edema.   Her last A1c was 7.8 on9/18/15. She is compliant with taking decreased dosage of Novolin 70/30 28 U BID and metformin 1000 mg BID. She has been checking her blood glucose 1-3 times daily and her glucose meter reveals average of 144 with range of 54-337. She has had fewer episodes of symptomatic hypoglycemia and admits they were due to not eating meals on time. She has chronic polydipsia, blurry vision, and neuropathy but denies polyphagia and polyuria. She denies foot injury but reports bilateral painful calluses. She declines podiatry referral at this time. She also declines starting gabapentin for chronic peripheral neuropathy. She tries to follow a low carbohydrate diet and exercise as much as she can. She has gained 5 lb since last visit one month ago.     She is compliant with taking pravastatin for hyperlipidemia and denies myalgias.    She has improved acid reflux symptoms after starting nexium daily and denies alarm symptoms.      Past Medical History  Diagnosis Date  . Parkinson's disease   . Diabetes mellitus without complication   . Hypertension   . Shortness of breath   . GERD (gastroesophageal reflux disease)    Current Outpatient Prescriptions  Medication Sig Dispense Refill  . ACCU-CHEK FASTCLIX LANCETS  MISC Use to test blood glucose 3 times daily. Dx: 250.00 102 each 12  . aspirin EC 81 MG tablet Take 81 mg by mouth daily.    . Blood Glucose Monitoring Suppl (ACCU-CHEK NANO SMARTVIEW) W/DEVICE KIT Use to test blood glucose 3 times daily. Dx:250.00 1 kit 0  . cetirizine (ZYRTEC ALLERGY) 10 MG tablet Take 1 tablet (10 mg total) by mouth daily. 30 tablet 3  . esomeprazole (NEXIUM) 20 MG capsule Take 1 capsule (20 mg total) by mouth daily. 30 capsule 2  . glucose blood (ACCU-CHEK SMARTVIEW) test strip Use to test blood glucose 3 times daily. Dx: 250.00 100 each 12  . insulin NPH-regular Human (NOVOLIN 70/30) (70-30) 100 UNIT/ML injection Take 28 Units in the morning and 28 Units at night 30 mL 11  . lisinopril-hydrochlorothiazide (PRINZIDE) 20-12.5 MG per tablet Take 1 tablet by mouth 2 (two) times daily. 60 tablet 3  . metFORMIN (GLUCOPHAGE) 1000 MG tablet TAKE ONE TABLET BY MOUTH TWICE DAILY WITH A MEAL 180 tablet 4  . metoprolol succinate (TOPROL XL) 100 MG 24 hr tablet Take 1 tablet (100 mg total) by mouth daily. Take with or immediately following a meal. 90 tablet 4  . mometasone (NASONEX) 50 MCG/ACT nasal spray Place 2 sprays into the nose daily. 51 g 4  . pravastatin (PRAVACHOL) 40 MG tablet Take 1 tablet (40 mg total) by mouth every evening. 30 tablet 3   No current facility-administered medications for this visit.   Family History  Problem Relation Age of Onset  .  Hypertension Mother   . Hypertension Father    History   Social History  . Marital Status: Married    Spouse Name: N/A    Number of Children: N/A  . Years of Education: 9th   Social History Main Topics  . Smoking status: Never Smoker   . Smokeless tobacco: Current User    Types: Snuff, Chew  . Alcohol Use: No  . Drug Use: No  . Sexual Activity: Not on file   Other Topics Concern  . Not on file   Social History Narrative   Review of Systems: Review of Systems  Constitutional: Negative for fever and chills.         Weight gain  HENT: Positive for congestion (due to AR).   Respiratory: Negative for cough, shortness of breath and wheezing.   Cardiovascular: Negative for chest pain and leg swelling.  Gastrointestinal: Positive for heartburn (improved on PPI therapy) and constipation (chronic). Negative for nausea, vomiting, abdominal pain, diarrhea, blood in stool and melena.  Genitourinary: Negative for dysuria, urgency, frequency and hematuria.  Musculoskeletal: Negative for myalgias and back pain.  Neurological: Positive for dizziness (improved lightheadedness ), sensory change (chronic peripheral neuropathy ) and headaches (occasionally).  Endo/Heme/Allergies: Positive for environmental allergies (chronic) and polydipsia.  Psychiatric/Behavioral: The patient is nervous/anxious (possible infrequent panic attacks).     Objective:  Physical Exam: Filed Vitals:   12/22/13 1354  BP: 134/63  Pulse: 80  Temp: 97.9 F (36.6 C)  TempSrc: Oral  Weight: 208 lb (94.348 kg)  SpO2: 96%    Physical Exam  Constitutional: She is oriented to person, place, and time. She appears well-developed and well-nourished. No distress.  HENT:  Head: Normocephalic and atraumatic.  Eyes: EOM are normal.  Neck: Normal range of motion. Neck supple.  Cardiovascular: Normal rate, regular rhythm and normal heart sounds.   Pulmonary/Chest: Effort normal and breath sounds normal. No respiratory distress. She has no wheezes. She has no rales.  Abdominal: Soft. Bowel sounds are normal. She exhibits no distension. There is no tenderness. There is no rebound and no guarding.  Musculoskeletal: Normal range of motion. She exhibits no edema or tenderness.  Neurological: She is alert and oriented to person, place, and time.  Skin: Skin is warm and dry. No rash noted. She is not diaphoretic. No erythema. No pallor.  Psychiatric: She has a normal mood and affect. Her behavior is normal. Judgment and thought content normal.     Assessment & Plan:   Please see problem list for problem-based assessment and plan

## 2013-12-22 NOTE — Patient Instructions (Signed)
-  Great job on improving your A1c, it has improved from 7.8 to 7.6, keep taking Novolin 28 twice a day and try to schedule your meals so your blood sugar does not drop too low -You blood pressure has improved, keep taking your medications -Will check your bloodwork today  -Will see you back in 3 months, happy holidays!  General Instructions:   Thank you for bringing your medicines today. This helps us keep you safe from mistakes.   Progress Toward Treatment Goals:  Treatment Goal 03/10/2013  Hemoglobin A1C at goal  Blood pressure at goal    Self Care Goals & Plans:  Self Care Goal 10/27/2013  Manage my medications take my medicines as prescribed; bring my medications to every visit  Monitor my health keep track of my blood glucose; bring my glucose meter and log to each visit; check my feet daily; keep track of my blood pressure  Eat healthy foods eat foods that are low in salt; eat baked foods instead of fried foods  Be physically active find an activity I enjoy  Meeting treatment goals -    Home Blood Glucose Monitoring 03/10/2013  Check my blood sugar 2 times a day  When to check my blood sugar before breakfast; before dinner     Care Management & Community Referrals:  Referral 03/10/2013  Referrals made for care management support none needed  Referrals made to community resources none

## 2013-12-25 NOTE — Progress Notes (Signed)
Medicine attending: Medical history, presenting problems, physical findings, and medications, reviewed with Dr Rabbani on the day of the patient visit and I concur with her evaluation and management plan. 

## 2014-01-25 ENCOUNTER — Other Ambulatory Visit: Payer: Self-pay | Admitting: Internal Medicine

## 2014-02-15 ENCOUNTER — Encounter: Payer: Self-pay | Admitting: *Deleted

## 2014-03-25 ENCOUNTER — Other Ambulatory Visit: Payer: Self-pay | Admitting: Internal Medicine

## 2014-03-26 ENCOUNTER — Other Ambulatory Visit: Payer: Self-pay | Admitting: Internal Medicine

## 2014-03-26 MED ORDER — ATORVASTATIN CALCIUM 40 MG PO TABS: 40.0000 mg | ORAL_TABLET | Freq: Every day | ORAL | Status: DC

## 2014-04-06 ENCOUNTER — Encounter: Payer: Medicaid Other | Admitting: Internal Medicine

## 2014-04-13 ENCOUNTER — Ambulatory Visit (INDEPENDENT_AMBULATORY_CARE_PROVIDER_SITE_OTHER): Payer: Medicaid Other | Admitting: Internal Medicine

## 2014-04-13 ENCOUNTER — Encounter: Payer: Self-pay | Admitting: Internal Medicine

## 2014-04-13 VITALS — BP 144/70 | HR 76 | Temp 97.9°F | Wt 203.0 lb

## 2014-04-13 DIAGNOSIS — E11329 Type 2 diabetes mellitus with mild nonproliferative diabetic retinopathy without macular edema: Secondary | ICD-10-CM | POA: Diagnosis not present

## 2014-04-13 DIAGNOSIS — E113299 Type 2 diabetes mellitus with mild nonproliferative diabetic retinopathy without macular edema, unspecified eye: Secondary | ICD-10-CM

## 2014-04-13 DIAGNOSIS — I10 Essential (primary) hypertension: Secondary | ICD-10-CM | POA: Diagnosis not present

## 2014-04-13 DIAGNOSIS — E785 Hyperlipidemia, unspecified: Secondary | ICD-10-CM

## 2014-04-13 DIAGNOSIS — Z0000 Encounter for general adult medical examination without abnormal findings: Secondary | ICD-10-CM | POA: Diagnosis not present

## 2014-04-13 LAB — POCT GLYCOSYLATED HEMOGLOBIN (HGB A1C): Hemoglobin A1C: 9.7

## 2014-04-13 LAB — GLUCOSE, CAPILLARY: Glucose-Capillary: 293 mg/dL — ABNORMAL HIGH (ref 70–99)

## 2014-04-13 MED ORDER — LISINOPRIL-HYDROCHLOROTHIAZIDE 20-12.5 MG PO TABS: 1.0000 | ORAL_TABLET | Freq: Two times a day (BID) | ORAL | Status: DC

## 2014-04-13 NOTE — Progress Notes (Signed)
[facility] Clinic Attending  Case discussed with Dr. Rabbani at the time of the visit.  We reviewed the resident's history and exam and pertinent patient test results.  I agree with the assessment, diagnosis, and plan of care documented in the resident's note.  

## 2014-04-13 NOTE — Progress Notes (Signed)
Patient ID: Destiny Russell, female   DOB: [birth year], 64 y.o.   MRN: [id number]     Subjective:   Patient ID: Destiny Russell female   DOB: 11-22-1950 64 y.o.   MRN: [id number]  HPI: [name] is a 64 y.o. very pleasant women with history of CAD, insulin-dependent Type 2 DM, hypertension, hyperlipidemia, GERD, and allergic rhinitis who presents for follow-up visit of diabetes.   Her last A1c was 7.6 on12/18/15. She is compliant with Novolin 70/30 28 U BID and metformin 1000 mg BID. She has been checking her blood glucose 1-2 times daily but forgot to bring her glucose meter. She reports her average blood sugars have been in the 100's. She denies symptomatic hypoglycemia. She has chronic polydipsia, blurry vision, and neuropathy but denies polyphagia, polyuria, and foot injury/ulcer. She tries to follow a low carbohydrate diet and stay active. She has lost 5 lb since last visit 3 months ago. She reports having cataracts and needs opthalmology referral for surgery.    She has been compliant with taking isinopril-HCTZ and metoprolol succinate for hypertension. She has occasional lightheadedness and headache but denies chest pain or LE edema.   She reports myalgias with taking pravastatin for hyperlipidemia and has prescription for atorvastatin which she has not yet started.     She declines screening colonoscopy and stool cards testing.    Past Medical History  Diagnosis Date  . Parkinson's disease   . Diabetes mellitus without complication   . Hypertension   . Shortness of breath   . GERD (gastroesophageal reflux disease)    Current Outpatient Prescriptions  Medication Sig Dispense Refill  . ACCU-CHEK FASTCLIX LANCETS MISC Use to test blood glucose 3 times daily. Dx: 250.00 102 each 12  . aspirin EC 81 MG tablet Take 81 mg by mouth daily.    Marland Kitchen atorvastatin (LIPITOR) 40 MG tablet Take 1 tablet (40 mg total) by mouth daily. 30 tablet 5  . Blood Glucose Monitoring Suppl  (ACCU-CHEK NANO SMARTVIEW) W/DEVICE KIT Use to test blood glucose 3 times daily. Dx:250.00 1 kit 0  . cetirizine (ZYRTEC) 10 MG tablet TAKE ONE TABLET BY MOUTH ONCE DAILY 30 tablet 5  . esomeprazole (NEXIUM) 20 MG capsule Take 1 capsule (20 mg total) by mouth daily. 30 capsule 2  . glucose blood (ACCU-CHEK SMARTVIEW) test strip Use to test blood glucose 3 times daily. Dx: 250.00 100 each 12  . insulin NPH-regular Human (NOVOLIN 70/30) (70-30) 100 UNIT/ML injection Take 28 Units in the morning and 28 Units at night 30 mL 11  . lisinopril-hydrochlorothiazide (PRINZIDE) 20-12.5 MG per tablet Take 1 tablet by mouth 2 (two) times daily. 60 tablet 3  . metFORMIN (GLUCOPHAGE) 1000 MG tablet TAKE ONE TABLET BY MOUTH TWICE DAILY WITH A MEAL 180 tablet 4  . mometasone (NASONEX) 50 MCG/ACT nasal spray Place 2 sprays into the nose daily. 51 g 4  . TOPROL XL 100 MG 24 hr tablet TAKE ONE TABLET BY MOUTH ONCE DAILY WITH OR IMMEDIATELY FOLLOWING A MEAL 90 tablet 3   No current facility-administered medications for this visit.   Family History  Problem Relation Age of Onset  . Hypertension Mother   . Hypertension Father    History   Social History  . Marital Status: Married    Spouse Name: N/A  . Number of Children: N/A  . Years of Education: 9th   Social History Main Topics  . Smoking status: Never Smoker   . Smokeless tobacco:  Current User    Types: Snuff, Chew  . Alcohol Use: No  . Drug Use: No  . Sexual Activity: Not on file   Other Topics Concern  . Not on file   Social History Narrative   Review of Systems: Review of Systems  Constitutional: Positive for weight loss and malaise/fatigue.  Eyes: Positive for blurred vision.  Respiratory: Negative for cough, shortness of breath and wheezing.   Cardiovascular: Negative for chest pain and leg swelling.  Gastrointestinal: Positive for constipation (chronic). Negative for heartburn, nausea, vomiting, abdominal pain, diarrhea and blood in  stool.  Genitourinary: Negative for dysuria, urgency and frequency.  Musculoskeletal: Positive for myalgias and back pain.  Neurological: Positive for dizziness (occasioanl lightheadeness ) and headaches.    Objective:  Physical Exam: Filed Vitals:   04/13/14 1406  BP: 150/58  Pulse: 76  Temp: 97.9 F (36.6 C)  TempSrc: Oral  Weight: 203 lb (92.08 kg)  SpO2: 99%   Physical Exam  Constitutional: She is oriented to person, place, and time. She appears well-developed and well-nourished. No distress.  HENT:  Head: Normocephalic and atraumatic.  Right Ear: External ear normal.  Left Ear: External ear normal.  Nose: Nose normal.  Mouth/Throat: Oropharynx is clear and moist. No oropharyngeal exudate.  Eyes: Conjunctivae and EOM are normal. Pupils are equal, round, and reactive to light. Right eye exhibits no discharge. Left eye exhibits no discharge. No scleral icterus.  Neck: Normal range of motion. Neck supple.  Cardiovascular: Normal rate and regular rhythm.   Pulmonary/Chest: Effort normal and breath sounds normal. No respiratory distress. She has no wheezes. She has no rales.  Abdominal: Soft. Bowel sounds are normal. She exhibits no distension. There is no tenderness. There is no rebound and no guarding.  Musculoskeletal: Normal range of motion. She exhibits no edema or tenderness.  Neurological: She is alert and oriented to person, place, and time.  Skin: Skin is warm and dry. No rash noted. She is not diaphoretic. No erythema. No pallor.  Psychiatric: She has a normal mood and affect. Her behavior is normal. Judgment and thought content normal.    Assessment & Plan:   Please see problem list for problem-based assessment and plan

## 2014-04-13 NOTE — Patient Instructions (Addendum)
-  Will refer you to opthalmology  -Please increase your AM insulin by 3 U if your nighttime blood sugar is > 150 for 3-4 days, and increase your PM insulin by 3 U daily if blood sugar is >150 for 3-4 days. Can repeat every 4-5 days until your surgars are controlled. -Start taking atorvastatin 40 mg daily for high cholesterol   -Please come back in 6 weeks with your meter    General Instructions:   Thank you for bringing your medicines today. This helps us keep you safe from mistakes.   Progress Toward Treatment Goals:  Treatment Goal 03/10/2013  Hemoglobin A1C at goal  Blood pressure at goal    Self Care Goals & Plans:  Self Care Goal 12/22/2013  Manage my medications take my medicines as prescribed; bring my medications to every visit; refill my medications on time  Monitor my health keep track of my blood glucose; bring my glucose meter and log to each visit  Eat healthy foods eat foods that are low in salt; eat baked foods instead of fried foods  Be physically active find an activity I enjoy  Meeting treatment goals -    Home Blood Glucose Monitoring 03/10/2013  Check my blood sugar 2 times a day  When to check my blood sugar before breakfast; before dinner     Care Management & Community Referrals:  Referral 03/10/2013  Referrals made for care management support none needed  Referrals made to community resources none

## 2014-04-15 NOTE — Assessment & Plan Note (Addendum)
-  Pt declined screening colonoscopy and stool card testing -Will defer zoster vaccination until turns 64 for insurance purposes -Obtain screening HIV testing at next visit

## 2014-04-15 NOTE — Assessment & Plan Note (Signed)
Assessment: Pt with last lipid panel on 10/27/13 with elevated LDL cholesterol noncompliant with moderate-intensity due to intolerance with ASCVD 10-yr risk of 13.9% with recommendations to continue moderate to high intensity statin therapy.   Plan:  -Pt instructed to start atorvastatin 40 mg daily -Last CMP on 12/22/13 with normal liver function -Continue to monitor for myalgias

## 2014-04-15 NOTE — Assessment & Plan Note (Signed)
Assessment: Pt with moderately-well controlled hypertension compliant with three-class (ACEi, diuretic, BB) anti-hypertensive therapy who presents with blood pressure of 144/70.   Plan:  -BP 144/70 near goal <140/90  -Continue lisinopril-HCTZ 20-12.5 mg BID and metoprolol succinate 100 mg daily  -Last CMP on 12/22/13 with stable CKD Stage 3

## 2014-04-15 NOTE — Assessment & Plan Note (Addendum)
Assessment: Pt with last A1c of 7.6 on 12/22/13 compliant with insulin and oral hypoglycemic therapy with no recent symptomatic hypoglycemia who presents with blood glucose of 293 and worsened A1c of 9.7.   Plan:  -A1c 9.7 not at goal <7.0, Pt instructed to continue metformin 1000 mg BID and self-titrate Novolin 70/30 insulin 28 U BID by increasing AM dose by 3 U if nighttime blood sugars for 3-4 days are > 150 and increase PM insulin dose by 3 U daily if blood sugars for 3-4 days are >150. Pt instructed to repeat every 4-5 days until AM and PM blood sugars are <150. Pt to return in 2 weeks with glucose meter.  -BP 144/70 near goal <140/90, continue lisinopril-HCTZ 20-12.5 mg BID and metoprolol succinate 100 mg daily  -LDL 119 not at goal <100, pt to start atorvastatin 40 mg daily    -Last annual foot exam on 09/22/13  -Last annual eye exam (retinal scanner) on 10/27/13 with mild right NPDR. Pt reports history of cataract, will refer to opthalmology.    -Last annual urine microalbumin test on 10/27/13 was normal  -BMI 37.12 not at goal <25, encourage weight loss

## 2014-05-28 LAB — HM DIABETES EYE EXAM

## 2014-05-31 DIAGNOSIS — E11359 Type 2 diabetes mellitus with proliferative diabetic retinopathy without macular edema: Secondary | ICD-10-CM | POA: Diagnosis not present

## 2014-05-31 DIAGNOSIS — E11349 Type 2 diabetes mellitus with severe nonproliferative diabetic retinopathy without macular edema: Secondary | ICD-10-CM | POA: Diagnosis not present

## 2014-05-31 DIAGNOSIS — H2513 Age-related nuclear cataract, bilateral: Secondary | ICD-10-CM | POA: Diagnosis not present

## 2014-05-31 DIAGNOSIS — H4312 Vitreous hemorrhage, left eye: Secondary | ICD-10-CM | POA: Diagnosis not present

## 2014-05-31 LAB — HM DIABETES EYE EXAM

## 2014-06-01 ENCOUNTER — Encounter: Payer: Self-pay | Admitting: Internal Medicine

## 2014-06-01 ENCOUNTER — Ambulatory Visit (INDEPENDENT_AMBULATORY_CARE_PROVIDER_SITE_OTHER): Payer: Medicaid Other | Admitting: Internal Medicine

## 2014-06-01 VITALS — BP 145/77 | HR 76 | Temp 98.0°F | Wt 206.5 lb

## 2014-06-01 DIAGNOSIS — E113299 Type 2 diabetes mellitus with mild nonproliferative diabetic retinopathy without macular edema, unspecified eye: Secondary | ICD-10-CM

## 2014-06-01 DIAGNOSIS — I10 Essential (primary) hypertension: Secondary | ICD-10-CM | POA: Diagnosis not present

## 2014-06-01 DIAGNOSIS — Z794 Long term (current) use of insulin: Secondary | ICD-10-CM

## 2014-06-01 DIAGNOSIS — E11329 Type 2 diabetes mellitus with mild nonproliferative diabetic retinopathy without macular edema: Secondary | ICD-10-CM

## 2014-06-01 DIAGNOSIS — E1165 Type 2 diabetes mellitus with hyperglycemia: Secondary | ICD-10-CM

## 2014-06-01 LAB — GLUCOSE, CAPILLARY: GLUCOSE-CAPILLARY: 141 mg/dL — AB (ref 65–99)

## 2014-06-01 MED ORDER — INSULIN NPH ISOPHANE & REGULAR (70-30) 100 UNIT/ML ~~LOC~~ SUSP
SUBCUTANEOUS | Status: DC
Start: 2014-06-01 — End: 2014-07-16

## 2014-06-01 MED ORDER — AMLODIPINE BESYLATE 5 MG PO TABS: 5.0000 mg | ORAL_TABLET | Freq: Every day | ORAL | Status: DC

## 2014-06-01 NOTE — Assessment & Plan Note (Addendum)
Assessment: Pt with last A1c of 9.7 on 04/13/14 compliant with insulin and oral hypoglycemic therapy with recent symptomatic hypoglycemia who presents with blood glucose of 141.   Plan:  -A1c 9.7 not at goal <7.0,pt instructed to decrease Novolin (70/30) from 32 U BID to 30 U in AM and 28 U in PM and to take PM dose before supper. Also to continue metformin 1000 mg BID. May need to switch her to Lantus and Novolog with meals if her glucose levels continue to be labile (she now has insurance).  -Obtain referral for diabetes education, pt to meet with Lupita Leashonna Plyler at next visit     -BP 145/77 not at goal <140/90, start amlodipine 5 mg daily and continue lisinopril-HCTZ 20-12.5 mg BID and metoprolol succinate 100 mg daily  -LDL 119 not at goal <100, pt  unable to tolerate atorvastatin 40 mg daily, will consider adjusting dosing at next visit if agreeable     -Last annual foot exam on 09/22/13  -Last annual eye exam (retinal scanner) on 10/27/13 with mild right NPDR. Obtain recent opthalmology records.    -Last annual urine microalbumin test on 10/27/13 was normal  -BMI 37.76 not at goal <25, encourage weight loss

## 2014-06-01 NOTE — Patient Instructions (Addendum)
-  Start taking amlodipine (norvasc) 5 mg daily for high blood pressure -Start taking 30 U insulin in the morning and 28 U at night, keep checking your sugars and make sure you take your night time dose before supper  -Please come back in early July for follow-up of diabetes and meet with Norm Parcelonna Plyler before our visit -Nice seeing you today!  General Instructions:   Please bring your medicines with you each time you come to clinic.  Medicines may include prescription medications, over-the-counter medications, herbal remedies, eye drops, vitamins, or other pills.   Progress Toward Treatment Goals:  Treatment Goal 03/10/2013  Hemoglobin A1C at goal  Blood pressure at goal    Self Care Goals & Plans:  Self Care Goal 06/01/2014  Manage my medications take my medicines as prescribed; bring my medications to every visit  Monitor my health keep track of my blood glucose; bring my glucose meter and log to each visit; check my feet daily  Eat healthy foods eat foods that are low in salt; eat baked foods instead of fried foods  Be physically active find an activity I enjoy  Meeting treatment goals -    Home Blood Glucose Monitoring 03/10/2013  Check my blood sugar 2 times a day  When to check my blood sugar before breakfast; before dinner     Care Management & Community Referrals:  Referral 03/10/2013  Referrals made for care management support none needed  Referrals made to community resources none

## 2014-06-01 NOTE — Assessment & Plan Note (Addendum)
Assessment: Pt with moderately-well controlled hypertension compliant with three-class (ACEi, diuretic, BB) anti-hypertensive therapy who presents with blood pressure of 145/77.   Plan:  -BP 145/77 not at goal <140/90  -Start amlodipine 5 mg daily  -Continue lisinopril-HCTZ 20-12.5 mg BID and metoprolol succinate 100 mg daily  -Last CMP on 12/22/13 with stable CKD Stage 2, repeat at next visit

## 2014-06-01 NOTE — Progress Notes (Signed)
Patient ID: Destiny Russell, female   DOB: 1950/02/19, 64 y.o.   MRN: [id number]    Subjective:   Patient ID: Destiny Russell female   DOB: 1950-01-13 64 y.o.   MRN: [id number]  HPI: Destiny Russell is a 64 y.o. very pleasant women with history of CAD, insulin-dependent Type 2 DM, hypertension, hyperlipidemia, GERD, and allergic rhinitis who presents for follow-up visit of diabetes.   Her last A1c was 9.7 on4/8/16. She has increased her Novolin 70/30 28 U BID to 32 U BID since last visit for the past 2 weeks and takes her PM dose after supper. She is also compliant with taking metformin 1000 mg BID with no side effects. She has been checking her blood glucose 1-2 times daily and brought her glucose meter which reveals average of 142 with range of 57-356 and three values below <70 with symptoms.  She has chronic polydipsia, blurry vision, and neuropathy but denies polyphagia, polyuria, or foot injury/ulcer. She tries to follow a low carbohydrate diet but admits to drinking a lot of diet soda. She does not exercise much and has gained 3 lbs since last visit in April. She saw ophthalmology recently and was told she has diabetic retinopathy and cataracts and is to have cataract removal in July and laser treatment in the coming months.    She has been compliant with taking isinopril-HCTZ and metoprolol succinate for hypertension. She has occasional lightheadedness and headache but denies chest pain or LE edema.   She reports myalgias with taking atorvastatin for hyperlipidemia which resolved after she stopped taking it.        Past Medical History  Diagnosis Date  . Parkinson's disease   . Diabetes mellitus without complication   . Hypertension   . Shortness of breath   . GERD (gastroesophageal reflux disease)    Current Outpatient Prescriptions  Medication Sig Dispense Refill  . ACCU-CHEK FASTCLIX LANCETS MISC Use to test blood glucose 3 times daily. Dx: 250.00 102 each 12  .  aspirin EC 81 MG tablet Take 81 mg by mouth daily.    Marland Kitchen atorvastatin (LIPITOR) 40 MG tablet Take 1 tablet (40 mg total) by mouth daily. 30 tablet 5  . Blood Glucose Monitoring Suppl (ACCU-CHEK NANO SMARTVIEW) W/DEVICE KIT Use to test blood glucose 3 times daily. Dx:250.00 1 kit 0  . cetirizine (ZYRTEC) 10 MG tablet TAKE ONE TABLET BY MOUTH ONCE DAILY 30 tablet 5  . esomeprazole (NEXIUM) 20 MG capsule Take 1 capsule (20 mg total) by mouth daily. 30 capsule 2  . glucose blood (ACCU-CHEK SMARTVIEW) test strip Use to test blood glucose 3 times daily. Dx: 250.00 100 each 12  . insulin NPH-regular Human (NOVOLIN 70/30) (70-30) 100 UNIT/ML injection Take 28 Units in the morning and 28 Units at night 30 mL 11  . lisinopril-hydrochlorothiazide (PRINZIDE) 20-12.5 MG per tablet Take 1 tablet by mouth 2 (two) times daily. 60 tablet 3  . metFORMIN (GLUCOPHAGE) 1000 MG tablet TAKE ONE TABLET BY MOUTH TWICE DAILY WITH A MEAL 180 tablet 4  . mometasone (NASONEX) 50 MCG/ACT nasal spray Place 2 sprays into the nose daily. 51 g 4  . TOPROL XL 100 MG 24 hr tablet TAKE ONE TABLET BY MOUTH ONCE DAILY WITH OR IMMEDIATELY FOLLOWING A MEAL 90 tablet 3   No current facility-administered medications for this visit.   Family History  Problem Relation Age of Onset  . Hypertension Mother   . Hypertension Father    History  Social History  . Marital Status: Married    Spouse Name: N/A  . Number of Children: N/A  . Years of Education: 9th   Social History Main Topics  . Smoking status: Never Smoker   . Smokeless tobacco: Current User    Types: Snuff, Chew  . Alcohol Use: No  . Drug Use: No  . Sexual Activity: Not on file   Other Topics Concern  . None   Social History Narrative   Review of Systems:   Review of Systems  Constitutional: Negative for fever and chills.       Weight gain  Eyes: Positive for blurred vision (chronic).       Cataracts  Respiratory: Positive for shortness of breath (with  exertion). Negative for wheezing.   Cardiovascular: Negative for chest pain and leg swelling.  Gastrointestinal: Positive for constipation (chronic). Negative for nausea, vomiting, abdominal pain, diarrhea and blood in stool.  Genitourinary: Negative for dysuria, urgency, frequency and hematuria.  Musculoskeletal: Positive for myalgias (resolved after stopping statin).  Neurological: Positive for dizziness and headaches (occasional).  Endo/Heme/Allergies: Positive for environmental allergies and polydipsia (chronic).    Objective:  Physical Exam: Filed Vitals:   06/01/14 1522  BP: 153/77  Pulse: 79  Temp: 98 F (36.7 C)  TempSrc: Oral  Weight: 206 lb 8 oz (93.668 kg)  SpO2: 98%    Physical Exam  Constitutional: She is oriented to person, place, and time. She appears well-developed and well-nourished. No distress.  HENT:  Head: Normocephalic and atraumatic.  Eyes: EOM are normal.  Neck: Normal range of motion. Neck supple.  Cardiovascular: Normal rate, regular rhythm and normal heart sounds.   Pulmonary/Chest: Effort normal and breath sounds normal. No respiratory distress. She has no wheezes. She has no rales.  Abdominal: Soft. Bowel sounds are normal. She exhibits no distension. There is no tenderness. There is no rebound and no guarding.  Musculoskeletal: Normal range of motion. She exhibits no edema or tenderness.  Neurological: She is alert and oriented to person, place, and time.  Skin: Skin is warm and dry. No rash noted. She is not diaphoretic. No erythema. No pallor.  Psychiatric: She has a normal mood and affect. Her behavior is normal. Judgment and thought content normal.    Assessment & Plan:   Please see problem list for problem-based assessment and plan

## 2014-06-03 NOTE — Progress Notes (Signed)
[facility] Attending  Case discussed with Dr. Rabbani soon after the resident saw the patient.  We reviewed the resident's history and exam and pertinent patient test results.  I agree with the assessment, diagnosis, and plan of care documented in the resident's note.  

## 2014-06-06 DIAGNOSIS — E11359 Type 2 diabetes mellitus with proliferative diabetic retinopathy without macular edema: Secondary | ICD-10-CM | POA: Diagnosis not present

## 2014-06-06 DIAGNOSIS — E11349 Type 2 diabetes mellitus with severe nonproliferative diabetic retinopathy without macular edema: Secondary | ICD-10-CM | POA: Diagnosis not present

## 2014-06-06 LAB — HM DIABETES EYE EXAM

## 2014-06-07 ENCOUNTER — Encounter: Payer: Self-pay | Admitting: *Deleted

## 2014-06-21 ENCOUNTER — Ambulatory Visit: Payer: Medicaid Other | Admitting: Dietician

## 2014-06-29 ENCOUNTER — Other Ambulatory Visit: Payer: Self-pay | Admitting: *Deleted

## 2014-06-29 DIAGNOSIS — E113299 Type 2 diabetes mellitus with mild nonproliferative diabetic retinopathy without macular edema, unspecified eye: Secondary | ICD-10-CM

## 2014-06-29 MED ORDER — GLUCOSE BLOOD VI STRP: ORAL_STRIP | Status: DC

## 2014-06-29 MED ORDER — ACCU-CHEK FASTCLIX LANCETS MISC: Status: DC

## 2014-07-03 ENCOUNTER — Encounter: Payer: Self-pay | Admitting: *Deleted

## 2014-07-10 ENCOUNTER — Encounter: Payer: Self-pay | Admitting: Internal Medicine

## 2014-07-11 ENCOUNTER — Telehealth: Payer: Self-pay | Admitting: Internal Medicine

## 2014-07-11 ENCOUNTER — Other Ambulatory Visit: Payer: Self-pay | Admitting: *Deleted

## 2014-07-11 DIAGNOSIS — E113299 Type 2 diabetes mellitus with mild nonproliferative diabetic retinopathy without macular edema, unspecified eye: Secondary | ICD-10-CM

## 2014-07-11 MED ORDER — ACCU-CHEK FASTCLIX LANCETS MISC: Status: DC

## 2014-07-11 MED ORDER — GLUCOSE BLOOD VI STRP: ORAL_STRIP | Status: DC

## 2014-07-11 NOTE — Telephone Encounter (Signed)
[facility]  [address]

## 2014-07-16 ENCOUNTER — Encounter: Payer: Self-pay | Admitting: Internal Medicine

## 2014-07-16 ENCOUNTER — Ambulatory Visit (INDEPENDENT_AMBULATORY_CARE_PROVIDER_SITE_OTHER): Payer: Medicare Other | Admitting: Internal Medicine

## 2014-07-16 VITALS — BP 144/74 | HR 70 | Temp 98.2°F | Ht 62.5 in | Wt 201.6 lb

## 2014-07-16 DIAGNOSIS — Z0000 Encounter for general adult medical examination without abnormal findings: Secondary | ICD-10-CM

## 2014-07-16 DIAGNOSIS — E1165 Type 2 diabetes mellitus with hyperglycemia: Secondary | ICD-10-CM

## 2014-07-16 DIAGNOSIS — F17290 Nicotine dependence, other tobacco product, uncomplicated: Secondary | ICD-10-CM

## 2014-07-16 DIAGNOSIS — Z794 Long term (current) use of insulin: Secondary | ICD-10-CM | POA: Diagnosis not present

## 2014-07-16 DIAGNOSIS — I10 Essential (primary) hypertension: Secondary | ICD-10-CM | POA: Diagnosis not present

## 2014-07-16 DIAGNOSIS — Z7982 Long term (current) use of aspirin: Secondary | ICD-10-CM | POA: Diagnosis not present

## 2014-07-16 DIAGNOSIS — Z79899 Other long term (current) drug therapy: Secondary | ICD-10-CM

## 2014-07-16 DIAGNOSIS — E11351 Type 2 diabetes mellitus with proliferative diabetic retinopathy with macular edema: Secondary | ICD-10-CM

## 2014-07-16 DIAGNOSIS — E113519 Type 2 diabetes mellitus with proliferative diabetic retinopathy with macular edema, unspecified eye: Secondary | ICD-10-CM

## 2014-07-16 LAB — POCT GLYCOSYLATED HEMOGLOBIN (HGB A1C): HEMOGLOBIN A1C: 8.8

## 2014-07-16 LAB — GLUCOSE, CAPILLARY: Glucose-Capillary: 292 mg/dL — ABNORMAL HIGH (ref 65–99)

## 2014-07-16 MED ORDER — GLUCOSE BLOOD VI STRP: ORAL_STRIP | Status: DC

## 2014-07-16 MED ORDER — ACCU-CHEK FASTCLIX LANCETS MISC: Status: DC

## 2014-07-16 MED ORDER — INSULIN PEN NEEDLE 32G X 4 MM MISC: Status: DC

## 2014-07-16 MED ORDER — INSULIN LISPRO PROT & LISPRO (75-25 MIX) 100 UNIT/ML KWIKPEN: PEN_INJECTOR | SUBCUTANEOUS | Status: DC

## 2014-07-16 NOTE — Patient Instructions (Addendum)
-  Great job on your A1c it is 8.8 from 9.7! -Keep taking metformin 1000 mg twice a day -Start taking Humalog Mix 30 U in AM and 28 U in PM instead of Novolin  -Please come back in 4-6 weeks with your meter   General Instructions:   Thank you for bringing your medicines today. This helps us keep you safe from mistakes.   Progress Toward Treatment Goals:  Treatment Goal 03/10/2013  Hemoglobin A1C at goal  Blood pressure at goal    Self Care Goals & Plans:  Self Care Goal 07/16/2014  Manage my medications take my medicines as prescribed; bring my medications to every visit; refill my medications on time; follow the sick day instructions if I am sick  Monitor my health keep track of my blood glucose; keep track of my blood pressure; check my feet daily  Eat healthy foods eat more vegetables; eat fruit for snacks and desserts; eat baked foods instead of fried foods; eat foods that are low in salt; eat smaller portions; drink diet soda or water instead of juice or soda  Be physically active find an activity I enjoy  Meeting treatment goals -    Home Blood Glucose Monitoring 03/10/2013  Check my blood sugar 2 times a day  When to check my blood sugar before breakfast; before dinner     Care Management & Community Referrals:  Referral 03/10/2013  Referrals made for care management support none needed  Referrals made to community resources none

## 2014-07-16 NOTE — Assessment & Plan Note (Signed)
Assessment: Pt with moderately-well controlled hypertension compliant with three-class (ACEi, diuretic, BB) anti-hypertensive therapy who presents with blood pressure of 144/74.   Plan:  -BP 144/74 not at goal <140/90  -Continue amlodipine 5 mg daily, consider increasing to 10 mg daily at next visit if continues to be uncontrolled -Continue lisinopril-HCTZ 20-12.5 mg BID and metoprolol succinate 100 mg daily  -Last CMP on 12/22/13 with stable CKD Stage 2, repeat at next visit

## 2014-07-16 NOTE — Progress Notes (Signed)
Patient ID: Destiny Russell, female   DOB: 1950/06/03, 64 y.o.   MRN: [id number]    Subjective:   Patient ID: Destiny Russell female   DOB: 28-Aug-1950 64 y.o.   MRN: [id number]  HPI: [name] is a 64 y.o. very pleasant women with history of CAD, insulin-dependent Type 2 DM, hypertension, hyperlipidemia, GERD, and allergic rhinitis who presents for follow-up visit of diabetes.   Her last A1c was 9.7 on4/8/16. At last visit on 06/01/14 she was instructed to decrease her Novolin 70/30 from 32 U BID to 30 U in AM and 28 U in PM. She reports her insurance has changed to Medicare now after she turned 64 and was sent a letter that they will not cover her insulin. She is compliant with taking metformin 1000 mg BID with no side effects. She has been checking her blood glucose 1-2 times daily but recently has not been able to do so due to running out of supplies (after insurance change). She reports they were in the 100's and denies symptomatic hypoglycemia. She has chronic polydipsia, blurry vision, and neuropathy but denies polyphagia, polyuria, or foot injury/ulcer. She tries to follow a low carbohydrate diet and has been limiting soda intake. She does not exercise much. She has lost 5 lb since last in May.She is to have cataract removal later this month.    She has been compliant with taking lisinopril-HCTZ, metoprolol succinate, and newly prescribed norvasc 5 mg daily for hypertension. She has occasional lightheadedness, headache, and b/l LE edema but denies chest pain.   She reports myalgias with taking atorvastatin for hyperlipidemia which resolved after she stopped taking it.She does not wish to restart statin therapy at this time.    Past Medical History  Diagnosis Date  . Parkinson's disease   . Diabetes mellitus without complication   . Hypertension   . Shortness of breath   . GERD (gastroesophageal reflux disease)    Current Outpatient Prescriptions  Medication Sig  Dispense Refill  . ACCU-CHEK FASTCLIX LANCETS MISC Use to test blood glucose 3 times daily. Dx: E11.351.insulin dependent 102 each 6  . amLODipine (NORVASC) 5 MG tablet Take 1 tablet (5 mg total) by mouth daily. 30 tablet 5  . aspirin EC 81 MG tablet Take 81 mg by mouth daily.    Marland Kitchen atorvastatin (LIPITOR) 40 MG tablet Take 1 tablet (40 mg total) by mouth daily. (Patient not taking: Reported on 06/01/2014) 30 tablet 5  . Blood Glucose Monitoring Suppl (ACCU-CHEK NANO SMARTVIEW) W/DEVICE KIT Use to test blood glucose 3 times daily. Dx:250.00 1 kit 0  . cetirizine (ZYRTEC) 10 MG tablet TAKE ONE TABLET BY MOUTH ONCE DAILY 30 tablet 5  . esomeprazole (NEXIUM) 20 MG capsule Take 1 capsule (20 mg total) by mouth daily. 30 capsule 2  . glucose blood (ACCU-CHEK SMARTVIEW) test strip Use to test blood glucose 3 times daily. Dx: E11.351.insulin dependent 100 each 6  . insulin NPH-regular Human (NOVOLIN 70/30) (70-30) 100 UNIT/ML injection Take 30 Units in the morning and 28 Units at night 30 mL 11  . lisinopril-hydrochlorothiazide (PRINZIDE) 20-12.5 MG per tablet Take 1 tablet by mouth 2 (two) times daily. 60 tablet 3  . metFORMIN (GLUCOPHAGE) 1000 MG tablet TAKE ONE TABLET BY MOUTH TWICE DAILY WITH A MEAL 180 tablet 4  . mometasone (NASONEX) 50 MCG/ACT nasal spray Place 2 sprays into the nose daily. 51 g 4  . TOPROL XL 100 MG 24 hr tablet TAKE ONE TABLET BY  MOUTH ONCE DAILY WITH OR IMMEDIATELY FOLLOWING A MEAL 90 tablet 3   No current facility-administered medications for this visit.   Family History  Problem Relation Age of Onset  . Hypertension Mother   . Hypertension Father    History   Social History  . Marital Status: Married    Spouse Name: N/A  . Number of Children: N/A  . Years of Education: 9th   Social History Main Topics  . Smoking status: Never Smoker   . Smokeless tobacco: Current User    Types: Snuff, Chew  . Alcohol Use: No  . Drug Use: No  . Sexual Activity: Not on file    Other Topics Concern  . None   Social History Narrative   Review of Systems: Review of Systems  Constitutional: Positive for weight loss.  Eyes: Positive for blurred vision (chronic).  Respiratory: Negative for cough, shortness of breath and wheezing.   Cardiovascular: Positive for leg swelling (trace b/l LE). Negative for chest pain.  Gastrointestinal: Negative for nausea, vomiting, abdominal pain, diarrhea and constipation.  Genitourinary: Negative for dysuria, urgency and frequency.  Musculoskeletal: Negative for myalgias.  Neurological: Positive for dizziness (lightheadedness ), sensory change (chronic peripheral neuropathy) and headaches.  Endo/Heme/Allergies: Positive for polydipsia (chronic).     Objective:  Physical Exam: Filed Vitals:   07/16/14 1625  BP: 144/74  Pulse: 70  Temp: 98.2 F (36.8 C)  TempSrc: Oral  Height: 5' 2.5" (1.588 m)  Weight: 201 lb 9.6 oz (91.445 kg)  SpO2: 98%   Physical Exam  Constitutional: She is oriented to person, place, and time. She appears well-developed and well-nourished. No distress.  HENT:  Head: Normocephalic and atraumatic.  Right Ear: External ear normal.  Left Ear: External ear normal.  Nose: Nose normal.  Mouth/Throat: Oropharynx is clear and moist. No oropharyngeal exudate.  Eyes: Conjunctivae and EOM are normal. Pupils are equal, round, and reactive to light. Right eye exhibits no discharge. Left eye exhibits no discharge. No scleral icterus.  Neck: Normal range of motion. Neck supple.  Cardiovascular: Normal rate, regular rhythm and normal heart sounds.   Pulmonary/Chest: Effort normal and breath sounds normal. No respiratory distress. She has no wheezes. She has no rales.  Abdominal: Soft. Bowel sounds are normal. She exhibits no distension. There is no tenderness. There is no rebound and no guarding.  Musculoskeletal: Normal range of motion. She exhibits edema (trace b/l LE). She exhibits no tenderness.   Neurological: She is alert and oriented to person, place, and time.  Skin: Skin is warm and dry. No rash noted. She is not diaphoretic. No erythema. No pallor.  Psychiatric: She has a normal mood and affect. Her behavior is normal. Judgment and thought content normal.    Assessment & Plan:   Please see problem list for problem-based assessment and plan

## 2014-07-16 NOTE — Assessment & Plan Note (Signed)
-  Obtain screening HIV and HCV Ab (born b/w 1946-65) at next visit -Inquire about zoster vaccination at next visit

## 2014-07-16 NOTE — Assessment & Plan Note (Addendum)
Assessment: Pt with last A1c of 9.7 on 04/13/14 compliant with insulin and oral hypoglycemic therapy with no recent symptomatic hypoglycemia who presents with blood glucose of 292 and improved A1c of 8.8.   Plan:  -A1c 8.8 not at goal <7.0,Due to change in insurance to Medicare, will change Novolin (70/30) 30 U AM/ 28 U PM to formulary preference Humalog Mix 75/25 30 U AM/ 28 U PM. Continue metformin 1000 mg BID. Pt to return in 6 weeks with glucose meter for further adjustment.    -BP 144/74 not at goal <140/90, continue amlodipine 5 mg daily, lisinopril-HCTZ 20-12.5 mg BID, and metoprolol succinate 100 mg daily  -LDL 119 not at goal <100, pt unable to tolerate atorvastatin 40 mg daily, pt declines statin therapy at this time    -Last annual foot exam on 09/22/13  -Last annual eye exam on 06/06/14 with proliferative diabetic retinopathy and macular edema, pt to have cataract removal later this month   -Last annual urine microalbumin test on 10/27/13 was normal  -BMI 36.26 not at goal <25, encourage weight loss

## 2014-07-17 NOTE — Progress Notes (Signed)
[facility] Attending  Case discussed with Dr. Rabbani soon after the resident saw the patient.  We reviewed the resident's history and exam and pertinent patient test results.  I agree with the assessment, diagnosis, and plan of care documented in the resident's note.  

## 2014-07-25 ENCOUNTER — Other Ambulatory Visit: Payer: Self-pay | Admitting: Internal Medicine

## 2014-07-25 NOTE — Addendum Note (Signed)
Addended by: Neomia DearPOWERS, Sundeep Cary E on: 07/25/2014 08:51 PM   Modules accepted: Orders

## 2014-07-27 DIAGNOSIS — H25813 Combined forms of age-related cataract, bilateral: Secondary | ICD-10-CM | POA: Diagnosis not present

## 2014-08-09 ENCOUNTER — Encounter: Payer: Self-pay | Admitting: *Deleted

## 2014-08-09 DIAGNOSIS — E11359 Type 2 diabetes mellitus with proliferative diabetic retinopathy without macular edema: Secondary | ICD-10-CM | POA: Diagnosis not present

## 2014-08-09 DIAGNOSIS — H4312 Vitreous hemorrhage, left eye: Secondary | ICD-10-CM | POA: Diagnosis not present

## 2014-08-09 DIAGNOSIS — H211X1 Other vascular disorders of iris and ciliary body, right eye: Secondary | ICD-10-CM | POA: Diagnosis not present

## 2014-08-09 LAB — HM DIABETES EYE EXAM

## 2014-08-10 ENCOUNTER — Encounter: Payer: Self-pay | Admitting: Internal Medicine

## 2014-08-10 DIAGNOSIS — E11359 Type 2 diabetes mellitus with proliferative diabetic retinopathy without macular edema: Secondary | ICD-10-CM | POA: Diagnosis not present

## 2014-08-10 DIAGNOSIS — H211X2 Other vascular disorders of iris and ciliary body, left eye: Secondary | ICD-10-CM | POA: Diagnosis not present

## 2014-08-14 DIAGNOSIS — H211X1 Other vascular disorders of iris and ciliary body, right eye: Secondary | ICD-10-CM | POA: Diagnosis not present

## 2014-08-14 DIAGNOSIS — E11359 Type 2 diabetes mellitus with proliferative diabetic retinopathy without macular edema: Secondary | ICD-10-CM | POA: Diagnosis not present

## 2014-08-23 DIAGNOSIS — E11359 Type 2 diabetes mellitus with proliferative diabetic retinopathy without macular edema: Secondary | ICD-10-CM | POA: Diagnosis not present

## 2014-09-13 DIAGNOSIS — H2513 Age-related nuclear cataract, bilateral: Secondary | ICD-10-CM | POA: Diagnosis not present

## 2014-09-13 DIAGNOSIS — E11359 Type 2 diabetes mellitus with proliferative diabetic retinopathy without macular edema: Secondary | ICD-10-CM | POA: Diagnosis not present

## 2014-09-13 DIAGNOSIS — H4312 Vitreous hemorrhage, left eye: Secondary | ICD-10-CM | POA: Diagnosis not present

## 2014-09-27 ENCOUNTER — Other Ambulatory Visit: Payer: Self-pay | Admitting: Internal Medicine

## 2014-10-04 DIAGNOSIS — H43823 Vitreomacular adhesion, bilateral: Secondary | ICD-10-CM | POA: Diagnosis not present

## 2014-10-04 DIAGNOSIS — H211X1 Other vascular disorders of iris and ciliary body, right eye: Secondary | ICD-10-CM | POA: Diagnosis not present

## 2014-10-04 DIAGNOSIS — E11359 Type 2 diabetes mellitus with proliferative diabetic retinopathy without macular edema: Secondary | ICD-10-CM | POA: Diagnosis not present

## 2014-10-04 DIAGNOSIS — H211X2 Other vascular disorders of iris and ciliary body, left eye: Secondary | ICD-10-CM | POA: Diagnosis not present

## 2014-10-26 ENCOUNTER — Other Ambulatory Visit: Payer: Self-pay | Admitting: Internal Medicine

## 2014-11-02 ENCOUNTER — Other Ambulatory Visit: Payer: Self-pay

## 2014-11-02 MED ORDER — INSULIN PEN NEEDLE 32G X 4 MM MISC: Status: DC

## 2014-11-27 ENCOUNTER — Other Ambulatory Visit: Payer: Self-pay | Admitting: Internal Medicine

## 2014-11-28 NOTE — Telephone Encounter (Signed)
Overdue for PCp appt to [facility] sch 1st available with PCP

## 2014-12-03 DIAGNOSIS — H4312 Vitreous hemorrhage, left eye: Secondary | ICD-10-CM | POA: Diagnosis not present

## 2014-12-03 DIAGNOSIS — H43823 Vitreomacular adhesion, bilateral: Secondary | ICD-10-CM | POA: Diagnosis not present

## 2014-12-03 DIAGNOSIS — E113599 Type 2 diabetes mellitus with proliferative diabetic retinopathy without macular edema, unspecified eye: Secondary | ICD-10-CM | POA: Diagnosis not present

## 2014-12-03 DIAGNOSIS — E113593 Type 2 diabetes mellitus with proliferative diabetic retinopathy without macular edema, bilateral: Secondary | ICD-10-CM | POA: Diagnosis not present

## 2014-12-05 DIAGNOSIS — E113591 Type 2 diabetes mellitus with proliferative diabetic retinopathy without macular edema, right eye: Secondary | ICD-10-CM | POA: Diagnosis not present

## 2014-12-25 ENCOUNTER — Other Ambulatory Visit: Payer: Self-pay | Admitting: Internal Medicine

## 2014-12-26 ENCOUNTER — Other Ambulatory Visit: Payer: Self-pay | Admitting: *Deleted

## 2014-12-27 MED ORDER — ACCU-CHEK FASTCLIX LANCETS MISC: Status: DC

## 2014-12-27 MED ORDER — GLUCOSE BLOOD VI STRP: ORAL_STRIP | Status: DC

## 2015-01-02 ENCOUNTER — Other Ambulatory Visit: Payer: Self-pay | Admitting: *Deleted

## 2015-01-02 MED ORDER — ACCU-CHEK FASTCLIX LANCETS MISC: Status: DC

## 2015-01-02 MED ORDER — GLUCOSE BLOOD VI STRP: ORAL_STRIP | Status: DC

## 2015-01-02 NOTE — Telephone Encounter (Addendum)
need less or equal to 5 refills per pharmacy. Thanks

## 2015-01-02 NOTE — Telephone Encounter (Signed)
Yes just filled out. Thanks!  Dr. Johna Rolesabbani

## 2015-01-03 ENCOUNTER — Other Ambulatory Visit: Payer: Self-pay | Admitting: Dietician

## 2015-01-03 DIAGNOSIS — E113599 Type 2 diabetes mellitus with proliferative diabetic retinopathy without macular edema, unspecified eye: Secondary | ICD-10-CM

## 2015-01-03 MED ORDER — ACCU-CHEK FASTCLIX LANCETS MISC: Status: DC

## 2015-01-03 MED ORDER — GLUCOSE BLOOD VI STRP: ORAL_STRIP | Status: DC

## 2015-01-03 NOTE — Telephone Encounter (Signed)
Trying generic ICD 10 code to see if [facility] will accept it

## 2015-01-16 DIAGNOSIS — E113599 Type 2 diabetes mellitus with proliferative diabetic retinopathy without macular edema, unspecified eye: Secondary | ICD-10-CM | POA: Diagnosis not present

## 2015-01-16 DIAGNOSIS — H4312 Vitreous hemorrhage, left eye: Secondary | ICD-10-CM | POA: Diagnosis not present

## 2015-01-16 DIAGNOSIS — H2513 Age-related nuclear cataract, bilateral: Secondary | ICD-10-CM | POA: Diagnosis not present

## 2015-01-16 DIAGNOSIS — E113591 Type 2 diabetes mellitus with proliferative diabetic retinopathy without macular edema, right eye: Secondary | ICD-10-CM | POA: Diagnosis not present

## 2015-01-16 DIAGNOSIS — E113593 Type 2 diabetes mellitus with proliferative diabetic retinopathy without macular edema, bilateral: Secondary | ICD-10-CM | POA: Diagnosis not present

## 2015-01-16 DIAGNOSIS — H43823 Vitreomacular adhesion, bilateral: Secondary | ICD-10-CM | POA: Diagnosis not present

## 2015-01-26 ENCOUNTER — Other Ambulatory Visit: Payer: Self-pay | Admitting: Internal Medicine

## 2015-01-28 ENCOUNTER — Other Ambulatory Visit: Payer: Self-pay | Admitting: Internal Medicine

## 2015-01-28 DIAGNOSIS — E113513 Type 2 diabetes mellitus with proliferative diabetic retinopathy with macular edema, bilateral: Secondary | ICD-10-CM | POA: Diagnosis not present

## 2015-01-28 DIAGNOSIS — H25813 Combined forms of age-related cataract, bilateral: Secondary | ICD-10-CM | POA: Diagnosis not present

## 2015-01-28 NOTE — Telephone Encounter (Signed)
Refill request was already sent to PCP

## 2015-01-28 NOTE — Telephone Encounter (Signed)
Patient requesting a refill on her Metformin

## 2015-02-07 DIAGNOSIS — H268 Other specified cataract: Secondary | ICD-10-CM | POA: Diagnosis not present

## 2015-02-07 DIAGNOSIS — H2512 Age-related nuclear cataract, left eye: Secondary | ICD-10-CM | POA: Diagnosis not present

## 2015-02-21 DIAGNOSIS — H268 Other specified cataract: Secondary | ICD-10-CM | POA: Diagnosis not present

## 2015-02-21 DIAGNOSIS — H2511 Age-related nuclear cataract, right eye: Secondary | ICD-10-CM | POA: Diagnosis not present

## 2015-02-22 ENCOUNTER — Encounter: Payer: Medicare Other | Admitting: Internal Medicine

## 2015-02-22 DIAGNOSIS — H2511 Age-related nuclear cataract, right eye: Secondary | ICD-10-CM | POA: Diagnosis not present

## 2015-03-01 ENCOUNTER — Encounter: Payer: Self-pay | Admitting: Internal Medicine

## 2015-03-01 ENCOUNTER — Ambulatory Visit (INDEPENDENT_AMBULATORY_CARE_PROVIDER_SITE_OTHER): Payer: Medicare Other | Admitting: Internal Medicine

## 2015-03-01 VITALS — BP 153/68 | HR 63 | Temp 97.9°F | Wt 198.2 lb

## 2015-03-01 DIAGNOSIS — Z23 Encounter for immunization: Secondary | ICD-10-CM | POA: Diagnosis not present

## 2015-03-01 DIAGNOSIS — E113599 Type 2 diabetes mellitus with proliferative diabetic retinopathy without macular edema, unspecified eye: Secondary | ICD-10-CM | POA: Diagnosis not present

## 2015-03-01 DIAGNOSIS — E785 Hyperlipidemia, unspecified: Secondary | ICD-10-CM | POA: Diagnosis not present

## 2015-03-01 DIAGNOSIS — Z0000 Encounter for general adult medical examination without abnormal findings: Secondary | ICD-10-CM | POA: Diagnosis not present

## 2015-03-01 DIAGNOSIS — Z9849 Cataract extraction status, unspecified eye: Secondary | ICD-10-CM | POA: Insufficient documentation

## 2015-03-01 DIAGNOSIS — I10 Essential (primary) hypertension: Secondary | ICD-10-CM

## 2015-03-01 HISTORY — DX: Cataract extraction status, unspecified eye: Z98.49

## 2015-03-01 LAB — GLUCOSE, CAPILLARY: Glucose-Capillary: 215 mg/dL — ABNORMAL HIGH (ref 65–99)

## 2015-03-01 LAB — POCT GLYCOSYLATED HEMOGLOBIN (HGB A1C): Hemoglobin A1C: 9

## 2015-03-01 MED ORDER — METOPROLOL SUCCINATE ER 100 MG PO TB24: ORAL_TABLET | ORAL | Status: DC

## 2015-03-01 MED ORDER — ZOSTER VACCINE LIVE 19400 UNT/0.65ML ~~LOC~~ SOLR: 0.6500 mL | Freq: Once | SUBCUTANEOUS | Status: DC

## 2015-03-01 MED ORDER — METFORMIN HCL 1000 MG PO TABS: 1000.0000 mg | ORAL_TABLET | Freq: Two times a day (BID) | ORAL | Status: DC

## 2015-03-01 MED ORDER — LISINOPRIL-HYDROCHLOROTHIAZIDE 20-12.5 MG PO TABS: 1.0000 | ORAL_TABLET | Freq: Two times a day (BID) | ORAL | Status: DC

## 2015-03-01 NOTE — Progress Notes (Signed)
Patient ID: Destiny Russell, female   DOB: [birth year], 65 y.o.   MRN: [id number]   Subjective:   Patient ID: Destiny M Ferrone female   DOB: 1950-03-28 65 y.o.   MRN: [id number]  HPI: [name] is a 65 y.o.  very pleasant women with history of CAD, insulin-dependent Type 2 DM, hypertension, hyperlipidemia, GERD, and allergic rhinitis who presents for follow-up visit of diabetes.   Her last A1c was 9.7 on7/11/16. She reports compliance with taking Humalog Mix 75/25 26 U BID and metformin 1000 mg BID. She was checking her blood glucose 1-2 times daily until her meter broke. She reports they were in the 100-200's. She was having symptomatic hypoglycemia biweekly and deceased her insulin to 26 U BID with improvement. She has chronic polydipsia, blurry vision, and peripheral neuropathy but denies polyphagia, polyuria, or foot injury/ulcer. She tries to follow a low carbohydrate diet but does not exercise much. She has lost 3 lb since last 7 months ago.She recently had cataract removal.   She has been compliant with taking lisinopril-HCTZ but only taking it daily instead of twice a day. She is complaint with taking metoprolol succinate but not taking norvasc due to adverse effect. She has occasional headache and b/l LE edema but denies chest pain or lightheadedness.    She is not currently on statin therapy due to history of myalgias and does not wish to restart statin therapy at this time.  She would like pneumonia and influenza shots.    Past Medical History  Diagnosis Date  . Parkinson's disease   . Diabetes mellitus without complication   . Hypertension   . Shortness of breath   . GERD (gastroesophageal reflux disease)    Current Outpatient Prescriptions  Medication Sig Dispense Refill  . ACCU-CHEK FASTCLIX LANCETS MISC Use to check blood sugar 3 to 4 times daily.Diagnosis code E11.3599. Insulin dependent. 102 each 5  . amLODipine (NORVASC) 5 MG tablet Take 1 tablet (5 mg  total) by mouth daily. 30 tablet 5  . aspirin EC 81 MG tablet Take 81 mg by mouth daily.    Marland Kitchen atorvastatin (LIPITOR) 40 MG tablet Take 1 tablet (40 mg total) by mouth daily. (Patient not taking: Reported on 06/01/2014) 30 tablet 5  . Blood Glucose Monitoring Suppl (ACCU-CHEK NANO SMARTVIEW) W/DEVICE KIT Use to test blood glucose 3 times daily. Dx:250.00 1 kit 0  . cetirizine (ZYRTEC) 10 MG tablet TAKE ONE TABLET BY MOUTH ONCE DAILY 30 tablet 5  . esomeprazole (NEXIUM) 20 MG capsule Take 1 capsule (20 mg total) by mouth daily. 30 capsule 2  . glucose blood (ACCU-CHEK SMARTVIEW) test strip Use to check blood sugar 3 times daily.Diagnosis code E11.3599. Insulin dependent. 100 each 5  . Insulin Lispro Prot & Lispro (HUMALOG MIX 75/25 KWIKPEN) (75-25) 100 UNIT/ML Kwikpen Take 30 Units in the morning and 28 Units at night 15 mL 11  . Insulin Pen Needle 32G X 4 MM MISC Use to inject insulin twice daily 100 each 11  . lisinopril-hydrochlorothiazide (PRINZIDE,ZESTORETIC) 20-12.5 MG tablet TAKE ONE TABLET BY MOUTH ONCE DAILY 30 tablet 5  . metFORMIN (GLUCOPHAGE) 1000 MG tablet TAKE ONE TABLET BY MOUTH TWICE DAILY WITH MEALS 180 tablet 3  . metoprolol succinate (TOPROL-XL) 100 MG 24 hr tablet TAKE ONE TABLET BY MOUTH ONCE DAILY WITH OR IMMEDIATELY FOLLOWING A MEAL 90 tablet 3  . NASONEX 50 MCG/ACT nasal spray USE TWO SPRAY(S) INTO THE NOSE ONCE DAILY. 51 g 2   No  current facility-administered medications for this visit.   Family History  Problem Relation Age of Onset  . Hypertension Mother   . Hypertension Father    Social History   Social History  . Marital Status: Married    Spouse Name: N/A  . Number of Children: N/A  . Years of Education: 9th   Social History Main Topics  . Smoking status: Never Smoker   . Smokeless tobacco: Current User    Types: Snuff, Chew  . Alcohol Use: No  . Drug Use: No  . Sexual Activity: Not on file   Other Topics Concern  . Not on file   Social History  Narrative   Review of Systems: Review of Systems  Constitutional: Positive for weight loss. Negative for fever and chills.  Eyes: Positive for blurred vision.       Recent cataract removal  Respiratory: Negative for cough, shortness of breath and wheezing.   Cardiovascular: Positive for leg swelling (b/l LE). Negative for chest pain.  Gastrointestinal: Positive for heartburn (on medical therapy) and constipation (chronic). Negative for nausea, vomiting, abdominal pain and diarrhea.  Genitourinary: Negative for dysuria, urgency and frequency.  Neurological: Positive for sensory change (chronic peripheral neuropathy ) and headaches. Negative for dizziness.  Endo/Heme/Allergies: Positive for environmental allergies and polydipsia (chronic).    Objective:  Physical Exam: Filed Vitals:   03/01/15 1451  BP: 153/68  Pulse: 63  Temp: 97.9 F (36.6 C)  TempSrc: Oral  Weight: 198 lb 3.2 oz (89.903 kg)  SpO2: 97%    Physical Exam  Constitutional: She is oriented to person, place, and time. She appears well-developed and well-nourished. No distress.  HENT:  Head: Normocephalic and atraumatic.  Right Ear: External ear normal.  Left Ear: External ear normal.  Nose: Nose normal.  Mouth/Throat: Oropharynx is clear and moist. No oropharyngeal exudate.  Eyes: Conjunctivae and EOM are normal. Pupils are equal, round, and reactive to light. Right eye exhibits no discharge. Left eye exhibits no discharge. No scleral icterus.  Neck: Normal range of motion. Neck supple.  Cardiovascular: Normal rate and regular rhythm.   Pulmonary/Chest: Effort normal and breath sounds normal. No respiratory distress. She has no wheezes. She has no rales.  Abdominal: Soft. Bowel sounds are normal. She exhibits no distension. There is no tenderness. There is no rebound and no guarding.  Musculoskeletal: Normal range of motion. She exhibits edema (b/l pedal edema ). She exhibits no tenderness.  Neurological: She is  alert and oriented to person, place, and time.  Skin: Skin is warm and dry. No rash noted. She is not diaphoretic. No erythema. No pallor.  Psychiatric: She has a normal mood and affect. Her behavior is normal. Judgment and thought content normal.    Assessment & Plan:   Please see problem list for problem-based assessment and plan

## 2015-03-01 NOTE — Patient Instructions (Addendum)
-  Your A1c is 9 which is a little worse than last time of 8.8, keep taking Humalog 26 U twice a day. If you are still getting lows, go down to 24 U twice a day. Check your sugar 3 times a day and bring your meter next time -I refilled your metformin, toprol-XL,  and lisinopril-HCTZ to take twice a day for high blood pressure -Will check you bloodwork and call you with the results  -Will check your feet and urine today -Will give you the pneumonia and flu shots today  -Will give you stool cards to fill out -Will give you a prescription for the shingles vaccine to get from your pharmacy -Very nice seeing you, please come back in 2-3 months for your diabetes  General Instructions:   Please bring your medicines with you each time you come to clinic.  Medicines may include prescription medications, over-the-counter medications, herbal remedies, eye drops, vitamins, or other pills.   Progress Toward Treatment Goals:  Treatment Goal 03/10/2013  Hemoglobin A1C at goal  Blood pressure at goal    Self Care Goals & Plans:  Self Care Goal 07/16/2014  Manage my medications take my medicines as prescribed; bring my medications to every visit; refill my medications on time; follow the sick day instructions if I am sick  Monitor my health keep track of my blood glucose; keep track of my blood pressure; check my feet daily  Eat healthy foods eat more vegetables; eat fruit for snacks and desserts; eat baked foods instead of fried foods; eat foods that are low in salt; eat smaller portions; drink diet soda or water instead of juice or soda  Be physically active find an activity I enjoy    Home Blood Glucose Monitoring 03/10/2013  Check my blood sugar 2 times a day  When to check my blood sugar before breakfast; before dinner     Care Management & Community Referrals:  Referral 03/10/2013  Referrals made for care management support none needed  Referrals made to community resources none

## 2015-03-01 NOTE — Assessment & Plan Note (Signed)
Assessment: Pt with moderately-well controlled hypertension compliant with three-class (ACEi, diuretic, BB) anti-hypertensive therapy who presents with blood pressure of 153/68.   Plan:  -BP 153/68 not at goal <140/90however not taking correct dose of lisinopril-HCTZ  -Refill lisinopril-HCTZ 20-12.5 mg BID (changed from daily) and metoprolol succinate 100 mg daily  -Obtain CMP

## 2015-03-01 NOTE — Assessment & Plan Note (Signed)
Assessment: Pt with last lipid panel on 10/26/13 with LDL 119 not on statin therapy due to intolerance with recommendations to initiate moderate to high-intensity statin therapy due to being in statin benefit group (CAD & DM).  Plan:  -Obtain annual lipid panel -Pt declines restarting statin therapy or alternative such as zetia  -Pt instructed on lifestyle modification

## 2015-03-01 NOTE — Assessment & Plan Note (Addendum)
Assessment: Pt with last A1c of 8.8 on 07/16/14 compliant with insulin and oral hypoglycemic therapy with recent symptomatic hypoglycemia who presents with blood glucose of 215 and mildly worsened A1c of 9.   Plan:  -A1c 9 not at goal <7.0,continue Humalog Mix 75/25 26 U BID, instructed to decrease to 24 U BID if continues to have symptomatic hypoglycemia. Continue metformin 1000 mg BID. Pt given new glucose meter. -BP 153/68 not at goal <140/90, continue lisinopril-HCTZ 20-12.5 mg BID (changed from daily) and metoprolol succinate 100 mg daily  -Obtain annual lipid panel, last LDL 119 not at goal <100, pt with tolerance to statin therapy and declines restarting or alternative therapy    -Last annual eye exam on 06/06/14 with proliferative diabetic retinopathy and macular edema, pt had recent cataract removal    -Perform annual foot exam -Obtain annual urine microalbumin test -BMI 35.65 not at goal <25, encourage weight loss -Continue aspirin 81 mg daily for primary CVD prevention

## 2015-03-01 NOTE — Assessment & Plan Note (Addendum)
-  Pt received annual influenza vaccination and PCV13, wll need PSV23 on 10/07/17 (5 yrs after last PSV23) -Pt given home stool cards for annual testing, declined screening colonoscopy  -Pt given prescription for zoster vaccination to obtain at her pharmacy  -Obtain screening HIV and HCV Ab  -Inquire regarding DEXA scan at next visit

## 2015-03-02 LAB — LIPID PANEL
Chol/HDL Ratio: 3.6 ratio units (ref 0.0–4.4)
Cholesterol, Total: 189 mg/dL (ref 100–199)
HDL: 53 mg/dL (ref >39)
LDL Calculated: 112 mg/dL — ABNORMAL HIGH (ref 0–99)
Triglycerides: 120 mg/dL (ref 0–149)
VLDL Cholesterol Cal: 24 mg/dL (ref 5–40)

## 2015-03-02 LAB — HEPATITIS C ANTIBODY: Hep C Virus Ab: <0.1 s/co ratio (ref 0.0–0.9)

## 2015-03-02 LAB — CMP14 + ANION GAP
A/G RATIO: 1.8 (ref 1.1–2.5)
ALT: 27 IU/L (ref 0–32)
AST: 26 IU/L (ref 0–40)
Albumin: 4.4 g/dL (ref 3.6–4.8)
Alkaline Phosphatase: 66 IU/L (ref 39–117)
Anion Gap: 20 mmol/L — ABNORMAL HIGH (ref 10.0–18.0)
BUN/Creatinine Ratio: 22 (ref 11–26)
BUN: 16 mg/dL (ref 8–27)
Bilirubin Total: 0.5 mg/dL (ref 0.0–1.2)
CALCIUM: 9.9 mg/dL (ref 8.7–10.3)
CO2: 22 mmol/L (ref 18–29)
Chloride: 97 mmol/L (ref 96–106)
Creatinine, Ser: 0.73 mg/dL (ref 0.57–1.00)
GFR, EST AFRICAN AMERICAN: 100 mL/min/{1.73_m2} (ref >59)
GFR, EST NON AFRICAN AMERICAN: 87 mL/min/{1.73_m2} (ref >59)
Globulin, Total: 2.5 g/dL (ref 1.5–4.5)
Glucose: 195 mg/dL — ABNORMAL HIGH (ref 65–99)
Potassium: 4 mmol/L (ref 3.5–5.2)
Sodium: 139 mmol/L (ref 134–144)
TOTAL PROTEIN: 6.9 g/dL (ref 6.0–8.5)

## 2015-03-02 LAB — HIV ANTIBODY (ROUTINE TESTING W REFLEX): HIV Screen 4th Generation wRfx: NONREACTIVE

## 2015-03-02 LAB — MICROALBUMIN / CREATININE URINE RATIO
Creatinine, Urine: 63.3 mg/dL
MICROALB/CREAT RATIO: 8.8 mg/g creat (ref 0.0–30.0)
MICROALBUM., U, RANDOM: 5.6 ug/mL

## 2015-03-04 NOTE — Progress Notes (Signed)
[facility] Attending  Case discussed with Dr. Rabbani soon after the resident saw the patient.  We reviewed the resident's history and exam and pertinent patient test results.  I agree with the assessment, diagnosis, and plan of care documented in the resident's note.  

## 2015-03-12 ENCOUNTER — Other Ambulatory Visit: Payer: Self-pay | Admitting: Internal Medicine

## 2015-03-12 DIAGNOSIS — J309 Allergic rhinitis, unspecified: Secondary | ICD-10-CM

## 2015-03-12 MED ORDER — MOMETASONE FUROATE 50 MCG/ACT NA SUSP: 2.0000 | Freq: Every day | NASAL | Status: DC

## 2015-03-26 DIAGNOSIS — H211X2 Other vascular disorders of iris and ciliary body, left eye: Secondary | ICD-10-CM | POA: Diagnosis not present

## 2015-03-26 DIAGNOSIS — E113592 Type 2 diabetes mellitus with proliferative diabetic retinopathy without macular edema, left eye: Secondary | ICD-10-CM | POA: Diagnosis not present

## 2015-03-26 DIAGNOSIS — H211X1 Other vascular disorders of iris and ciliary body, right eye: Secondary | ICD-10-CM | POA: Diagnosis not present

## 2015-03-26 DIAGNOSIS — E113591 Type 2 diabetes mellitus with proliferative diabetic retinopathy without macular edema, right eye: Secondary | ICD-10-CM | POA: Diagnosis not present

## 2015-03-26 DIAGNOSIS — H43823 Vitreomacular adhesion, bilateral: Secondary | ICD-10-CM | POA: Diagnosis not present

## 2015-03-26 DIAGNOSIS — H35371 Puckering of macula, right eye: Secondary | ICD-10-CM | POA: Diagnosis not present

## 2015-04-04 DIAGNOSIS — E113591 Type 2 diabetes mellitus with proliferative diabetic retinopathy without macular edema, right eye: Secondary | ICD-10-CM | POA: Diagnosis not present

## 2015-04-04 DIAGNOSIS — H211X1 Other vascular disorders of iris and ciliary body, right eye: Secondary | ICD-10-CM | POA: Diagnosis not present

## 2015-05-17 ENCOUNTER — Telehealth: Payer: Self-pay | Admitting: Licensed Clinical Social Worker

## 2015-05-17 NOTE — Telephone Encounter (Signed)
CSW placed call to pt's pharmacy to request a 6 month refill history for patient.  Requesting the history to be faxed to [phone].  Contact information provided. 

## 2015-05-20 NOTE — Telephone Encounter (Signed)
i got it shana

## 2015-05-21 NOTE — Telephone Encounter (Signed)
Requested fax received.  Handed off to nurse.

## 2015-05-23 DIAGNOSIS — E113591 Type 2 diabetes mellitus with proliferative diabetic retinopathy without macular edema, right eye: Secondary | ICD-10-CM | POA: Diagnosis not present

## 2015-05-23 DIAGNOSIS — E113592 Type 2 diabetes mellitus with proliferative diabetic retinopathy without macular edema, left eye: Secondary | ICD-10-CM | POA: Diagnosis not present

## 2015-05-23 DIAGNOSIS — H4312 Vitreous hemorrhage, left eye: Secondary | ICD-10-CM | POA: Diagnosis not present

## 2015-05-23 DIAGNOSIS — H211X2 Other vascular disorders of iris and ciliary body, left eye: Secondary | ICD-10-CM | POA: Diagnosis not present

## 2015-05-23 DIAGNOSIS — H43823 Vitreomacular adhesion, bilateral: Secondary | ICD-10-CM | POA: Diagnosis not present

## 2015-05-31 ENCOUNTER — Ambulatory Visit: Payer: Medicare Other | Admitting: Pharmacist

## 2015-05-31 ENCOUNTER — Ambulatory Visit (INDEPENDENT_AMBULATORY_CARE_PROVIDER_SITE_OTHER): Payer: Medicare Other | Admitting: Internal Medicine

## 2015-05-31 ENCOUNTER — Encounter: Payer: Self-pay | Admitting: Internal Medicine

## 2015-05-31 VITALS — BP 141/65 | HR 78 | Temp 98.0°F | Wt 197.7 lb

## 2015-05-31 DIAGNOSIS — I10 Essential (primary) hypertension: Secondary | ICD-10-CM

## 2015-05-31 DIAGNOSIS — Z6835 Body mass index (BMI) 35.0-35.9, adult: Secondary | ICD-10-CM | POA: Diagnosis not present

## 2015-05-31 DIAGNOSIS — M25511 Pain in right shoulder: Secondary | ICD-10-CM

## 2015-05-31 DIAGNOSIS — J309 Allergic rhinitis, unspecified: Secondary | ICD-10-CM

## 2015-05-31 DIAGNOSIS — Z794 Long term (current) use of insulin: Secondary | ICD-10-CM

## 2015-05-31 DIAGNOSIS — E1142 Type 2 diabetes mellitus with diabetic polyneuropathy: Secondary | ICD-10-CM | POA: Diagnosis not present

## 2015-05-31 DIAGNOSIS — Z7982 Long term (current) use of aspirin: Secondary | ICD-10-CM | POA: Diagnosis not present

## 2015-05-31 DIAGNOSIS — Z0000 Encounter for general adult medical examination without abnormal findings: Secondary | ICD-10-CM

## 2015-05-31 DIAGNOSIS — E113599 Type 2 diabetes mellitus with proliferative diabetic retinopathy without macular edema, unspecified eye: Secondary | ICD-10-CM | POA: Diagnosis not present

## 2015-05-31 DIAGNOSIS — E1165 Type 2 diabetes mellitus with hyperglycemia: Secondary | ICD-10-CM | POA: Diagnosis not present

## 2015-05-31 LAB — GLUCOSE, CAPILLARY: Glucose-Capillary: 187 mg/dL — ABNORMAL HIGH (ref 65–99)

## 2015-05-31 LAB — POCT GLYCOSYLATED HEMOGLOBIN (HGB A1C): HEMOGLOBIN A1C: 8.7

## 2015-05-31 MED ORDER — FLUTICASONE PROPIONATE 50 MCG/ACT NA SUSP: 2.0000 | Freq: Every day | NASAL | Status: DC

## 2015-05-31 MED ORDER — EXENATIDE 5 MCG/0.02ML ~~LOC~~ SOPN: 5.0000 ug | PEN_INJECTOR | Freq: Two times a day (BID) | SUBCUTANEOUS | Status: DC

## 2015-05-31 NOTE — Progress Notes (Signed)
Destiny Russell is a 65 y.o. female who was educated on Byetta injection technique including preparation, administration, and disposal. She currently uses a Humalog Kwikpen and is comfortable with using pens. Family was present in the room and was able to confirm familiarity with injection technique. Reinforced importance of medication therapy including indication, dosing, and possible adverse effects. Patient verbalized understanding of information by repeating back concepts discussed and asked pertinent questions to demonstrate understanding.    10 minutes spent face-to-face with the patient during the encounter. 50% of time spent on education. 50% of time was spent on demonstration.

## 2015-05-31 NOTE — Patient Instructions (Signed)
-Your A1c is a little better - 8.7. We are going to start you on byetta twice a day to help lower your A1c to below 7. Keep taking insulin and metformin.  -Take naproxen and tylenol until the shoulder pain improves. Please do the exercises below.  -Please come back in 3-4 weeks if the pain does not improve and in 1 month for your diabetes. Keep checking your sugars.  -I changed your nasonex to flonase for your allergies  -Pleasure being your doctor, will miss you!  Shoulder Range of Motion Exercises Shoulder range of motion (ROM) exercises are designed to keep the shoulder moving freely. They are often recommended for people who have shoulder pain. MOVEMENT EXERCISE When you are able, do this exercise 5-6 days per week, or as told by your health care provider. Work toward doing 2 sets of 10 swings. Pendulum Exercise How To Do This Exercise Lying Down 1. Lie face-down on a bed with your abdomen close to the side of the bed. 2. Let your arm hang over the side of the bed. 3. Relax your shoulder, arm, and hand. 4. Slowly and gently swing your arm forward and back. Do not use your neck muscles to swing your arm. They should be relaxed. If you are struggling to swing your arm, have someone gently swing it for you. When you do this exercise for the first time, swing your arm at a 15 degree angle for 15 seconds, or swing your arm 10 times. As pain lessens over time, increase the angle of the swing to 30-45 degrees. 5. Repeat steps 1-4 with the other arm. How To Do This Exercise While Standing 1. Stand next to a sturdy chair or table and hold on to it with your hand.  Bend forward at the waist.  Bend your knees slightly.  Relax your other arm and let it hang limp.  Relax the shoulder blade of the arm that is hanging and let it drop.  While keeping your shoulder relaxed, use body motion to swing your arm in small circles. The first time you do this exercise, swing your arm for about 30 seconds or  10 times. When you do it next time, swing your arm for a little longer.  Stand up tall and relax.  Repeat steps 1-7, this time changing the direction of the circles. 2. Repeat steps 1-8 with the other arm. STRETCHING EXERCISES Do these exercises 3-4 times per day on 5-6 days per week or as told by your health care provider. Work toward holding the stretch for 20 seconds. Stretching Exercise 1 1. Lift your arm straight out in front of you. 2. Bend your arm 90 degrees at the elbow (right angle) so your forearm goes across your body and looks like the letter "L." 3. Use your other arm to gently pull the elbow forward and across your body. 4. Repeat steps 1-3 with the other arm. Stretching Exercise 2 You will need a towel or rope for this exercise. 1. Bend one arm behind your back with the palm facing outward. 2. Hold a towel with your other hand. 3. Reach the arm that holds the towel above your head, and bend that arm at the elbow. Your wrist should be behind your neck. 4. Use your free hand to grab the free end of the towel. 5. With the higher hand, gently pull the towel up behind you. 6. With the lower hand, pull the towel down behind you. 7. Repeat steps 1-6 with  the other arm. STRENGTHENING EXERCISES Do each of these exercises at four different times of day (sessions) every day or as told by your health care provider. To begin with, repeat each exercise 5 times (repetitions). Work toward doing 3 sets of 12 repetitions or as told by your health care provider. Strengthening Exercise 1 You will need a light weight for this activity. As you grow stronger, you may use a heavier weight. 1. Standing with a weight in your hand, lift your arm straight out to the side until it is at the same height as your shoulder. 2. Bend your arm at 90 degrees so that your fingers are pointing to the ceiling. 3. Slowly raise your hand until your arm is straight up in the air. 4. Repeat steps 1-3 with the other  arm. Strengthening Exercise 2 You will need a light weight for this activity. As you grow stronger, you may use a heavier weight. 1. Standing with a weight in your hand, gradually move your straight arm in an arc, starting at your side, then out in front of you, then straight up over your head. 2. Gradually move your other arm in an arc, starting at your side, then out in front of you, then straight up over your head. 3. Repeat steps 1-2 with the other arm. Strengthening Exercise 3 You will need an elastic band for this activity. As you grow stronger, gradually increase the size of the bands or increase the number of bands that you use at one time. 1. While standing, hold an elastic band in one hand and raise that arm up in the air. 2. With your other hand, pull down the band until that hand is by your side. 3. Repeat steps 1-2 with the other arm.   This information is not intended to replace advice given to you by your health care provider. Make sure you discuss any questions you have with your health care provider.   Document Released: 09/20/2002 Document Revised: 05/08/2014 Document Reviewed: 12/18/2013 Elsevier Interactive Patient Education 2016 [employer] Adhesive Capsulitis Adhesive capsulitis is inflammation of the tendons and ligaments that surround the shoulder joint (shoulder capsule). This condition causes the shoulder to become stiff and painful to move. Adhesive capsulitis is also called frozen shoulder. CAUSES This condition may be caused by: 6. An injury to the shoulder joint. 7. Straining the shoulder. 8. Not moving the shoulder for a period of time. This can happen if your arm was injured or in a sling. 9. Long-standing health problems, such as: 1. Diabetes. 2. Thyroid problems. 3. Heart disease. 4. Stroke. 5. Rheumatoid arthritis. 6. Lung disease. In some cases, the cause may not be known. RISK FACTORS This condition is more likely to develop  in: 3. Women. 4. People who are older than 66 years of age. SYMPTOMS Symptoms of this condition include: 5. Pain in the shoulder when moving the arm. There may also be pain when parts of the shoulder are touched. The pain is worse at night or when at rest. 6. Soreness or aching in the shoulder. 7. Inability to move the shoulder normally. 8. Muscle spasms. DIAGNOSIS This condition is diagnosed with a physical exam and imaging tests, such as an X-ray or MRI. TREATMENT This condition may be treated with: 8. Treatment of the underlying cause or condition. 9. Physical therapy. This involves performing exercises to get the shoulder moving again. 10. Medicine. Medicine may be given to relieve pain, inflammation, or muscle spasms. 11. Steroid injections into  the shoulder joint. 12. Shoulder manipulation. This is a procedure to move the shoulder into another position. It is done after you are given a medicine to make you fall asleep (general anesthetic). The joint may also be injected with salt water at high pressure to break down scarring. 13. Surgery. This may be done in severe cases when other treatments have failed. Although most people recover completely from adhesive capsulitis, some may not regain the full movement of the shoulder. HOME CARE INSTRUCTIONS 5. Take over-the-counter and prescription medicines only as told by your health care provider. 6. If you are being treated with physical therapy, follow instructions from your physical therapist. 7. Avoid exercises that put a lot of demand on your shoulder, such as throwing. These exercises can make pain worse. 8. If directed, apply ice to the injured area: 1. Put ice in a plastic bag. 2. Place a towel between your skin and the bag. 3. Leave the ice on for 20 minutes, 2-3 times per day. SEEK MEDICAL CARE IF: 4. You develop new symptoms. 5. Your symptoms get worse.   This information is not intended to replace advice given to you by your  health care provider. Make sure you discuss any questions you have with your health care provider.   Document Released: 10/19/2008 Document Revised: 09/12/2014 Document Reviewed: 04/16/2014 Elsevier Interactive Patient Education 2016 [employer].    General Instructions:   Please bring your medicines with you each time you come to clinic.  Medicines may include prescription medications, over-the-counter medications, herbal remedies, eye drops, vitamins, or other pills.   Progress Toward Treatment Goals:  Treatment Goal 03/10/2013  Hemoglobin A1C at goal  Blood pressure at goal    Self Care Goals & Plans:  Self Care Goal 07/16/2014  Manage my medications take my medicines as prescribed; bring my medications to every visit; refill my medications on time; follow the sick day instructions if I am sick  Monitor my health keep track of my blood glucose; keep track of my blood pressure; check my feet daily  Eat healthy foods eat more vegetables; eat fruit for snacks and desserts; eat baked foods instead of fried foods; eat foods that are low in salt; eat smaller portions; drink diet soda or water instead of juice or soda  Be physically active find an activity I enjoy    Home Blood Glucose Monitoring 03/10/2013  Check my blood sugar 2 times a day  When to check my blood sugar before breakfast; before dinner     Care Management & Community Referrals:  Referral 03/10/2013  Referrals made for care management support none needed  Referrals made to community resources none

## 2015-06-01 DIAGNOSIS — M25511 Pain in right shoulder: Secondary | ICD-10-CM | POA: Insufficient documentation

## 2015-06-01 NOTE — Assessment & Plan Note (Addendum)
Assessment: Pt with acute right shoulder pain and stiffness of 3-week duration with no preceding injury, trauma, heavy lifting, or fall most likely due to early adhesive capsulitis in setting of uncontrolled diabetes vs subacromial bursitis vs rotator cuff tendinitis.     Plan:  -Pt instructed to use OTC tylenol 500 mg Q 4 hr PRN pain and OTC naproxen (short-term in setting of CKD) -Pt given handout for shoulder ROM exercises to perform at home, declined physical therapy at this time -Pt to return in 3-4 weeks if pain/stiffness does not improve for referral to sports medicine for further management including possible corticosteroid injection

## 2015-06-01 NOTE — Progress Notes (Signed)
Patient ID: Destiny Russell, female   DOB: 01-16-1950, 66 y.o.   MRN: [id number]    Subjective:   Patient ID: Destiny M Cashen female   DOB: 30-May-1950 65 y.o.   MRN: [id number]  HPI: [name] is a 65 y.o. very pleasant women with history of CAD, insulin-dependent Type 2 DM, hypertension, hyperlipidemia, GERD, and allergic rhinitis who presents for follow-up visit of diabetes.   Her last A1c was 9 on 03/01/15. She reports compliance with taking Humalog Mix 75/25 26 U BID and metformin 1000 mg BID. She checks her blood glucose 1-2 times daily and brought her meter which reveals range of 66-295 with average of 187. She denies symptomatic hypoglycemia. She has chronic peripheral neuropathy but denies blurry vision, polydipsia, polyphagia, polyuria, or foot injury/ulcer. She tries to follow a low carbohydrate diet but does not exercise much. Her weight has been stable since last 3 months ago.   She has been compliant with taking lisinopril-HCTZ and metoprolol succinate for hypertension. She has occasional headache but denies chest pain, LE edema, or lightheadedness.   She is not currently on statin therapy due to history of myalgias and does not wish to restart statin therapy or alternative at this time.  She reports right shoulder pain and decreased ROM in all directions for the past 3 weeks with no preceding injury, trauma, heavy lifting, or fall. She has not had prior injury to her shoulder. She has associated right arm pain and right sided neck pain but denies weakness or paraesthesias in her right arm/hand. She has been taking OTC tylenol and applying heat and OTC cream which helps.    Past Medical History  Diagnosis Date  . Parkinson's disease ([place])   . Diabetes mellitus without complication ([place])   . Hypertension   . Shortness of breath   . GERD (gastroesophageal reflux disease)    Current Outpatient Prescriptions  Medication Sig Dispense Refill  . ACCU-CHEK FASTCLIX  LANCETS MISC Use to check blood sugar 3 to 4 times daily.Diagnosis code E11.3599. Insulin dependent. 102 each 5  . aspirin EC 81 MG tablet Take 81 mg by mouth daily.    . Blood Glucose Monitoring Suppl (ACCU-CHEK NANO SMARTVIEW) W/DEVICE KIT Use to test blood glucose 3 times daily. Dx:250.00 1 kit 0  . cetirizine (ZYRTEC) 10 MG tablet TAKE ONE TABLET BY MOUTH ONCE DAILY 30 tablet 5  . exenatide (BYETTA 5 MCG PEN) 5 MCG/0.02ML SOPN injection Inject 0.02 mLs (5 mcg total) into the skin 2 (two) times daily with a meal. 1.2 mL 0  . fluticasone (FLONASE) 50 MCG/ACT nasal spray Place 2 sprays into both nostrils daily. 16 g 3  . glucose blood (ACCU-CHEK SMARTVIEW) test strip Use to check blood sugar 3 times daily.Diagnosis code E11.3599. Insulin dependent. 100 each 5  . Insulin Lispro Prot & Lispro (HUMALOG MIX 75/25 KWIKPEN) (75-25) 100 UNIT/ML Kwikpen Take 30 Units in the morning and 28 Units at night 15 mL 11  . Insulin Pen Needle 32G X 4 MM MISC Use to inject insulin twice daily 100 each 11  . lisinopril-hydrochlorothiazide (PRINZIDE,ZESTORETIC) 20-12.5 MG tablet Take 1 tablet by mouth 2 (two) times daily. 180 tablet 3  . metFORMIN (GLUCOPHAGE) 1000 MG tablet Take 1 tablet (1,000 mg total) by mouth 2 (two) times daily with a meal. 180 tablet 3  . metoprolol succinate (TOPROL-XL) 100 MG 24 hr tablet TAKE ONE TABLET BY MOUTH ONCE DAILY WITH OR IMMEDIATELY FOLLOWING A MEAL 90 tablet 3  .  zoster vaccine live, PF, (ZOSTAVAX) 63817 UNT/0.65ML injection Inject 19,400 Units into the skin once. 1 each 0   No current facility-administered medications for this visit.   Family History  Problem Relation Age of Onset  . Hypertension Mother   . Hypertension Father    Social History   Social History  . Marital Status: Married    Spouse Name: N/A  . Number of Children: N/A  . Years of Education: 9th   Social History Main Topics  . Smoking status: Never Smoker   . Smokeless tobacco: Current User    Types:  Snuff, Chew  . Alcohol Use: No  . Drug Use: No  . Sexual Activity: Not Asked   Other Topics Concern  . None   Social History Narrative   Review of Systems: Review of Systems  Constitutional: Positive for malaise/fatigue. Negative for fever and chills.  Eyes: Negative for blurred vision.  Respiratory: Negative for cough, shortness of breath and wheezing.   Cardiovascular: Negative for chest pain and leg swelling.  Gastrointestinal: Positive for constipation (chronic). Negative for nausea, vomiting, abdominal pain and diarrhea.  Genitourinary: Negative for dysuria, urgency and frequency.  Musculoskeletal: Positive for joint pain (right shoulder pain for past 3 weeks) and neck pain (right sided). Negative for myalgias and falls.  Neurological: Positive for sensory change (chronic peripheral neuropathy) and headaches (occasionally). Negative for dizziness.  Endo/Heme/Allergies: Positive for environmental allergies and polydipsia.    Objective:  Physical Exam: Filed Vitals:   05/31/15 1526  BP: 141/65  Pulse: 78  Temp: 98 F (36.7 C)  TempSrc: Oral  Weight: 197 lb 11.2 oz (89.676 kg)  SpO2: 98%    Physical Exam  Constitutional: She is oriented to person, place, and time. She appears well-developed and well-nourished. No distress.  HENT:  Head: Normocephalic and atraumatic.  Right Ear: External ear normal.  Left Ear: External ear normal.  Nose: Nose normal.  Mouth/Throat: Oropharynx is clear and moist. No oropharyngeal exudate.  Eyes: Conjunctivae and EOM are normal. Pupils are equal, round, and reactive to light. Right eye exhibits no discharge. Left eye exhibits no discharge. No scleral icterus.  Neck: Normal range of motion. Neck supple.  Cardiovascular: Normal rate, regular rhythm and normal heart sounds.   No murmur heard. Pulmonary/Chest: Effort normal and breath sounds normal. No respiratory distress. She has no wheezes. She has no rales.  Abdominal: Soft. Bowel  sounds are normal. She exhibits no distension. There is no tenderness. There is no rebound and no guarding.  Musculoskeletal: She exhibits tenderness (Right glenohumeral joint). She exhibits no edema.  Decreased ROM of right shoulder.   Neurological: She is alert and oriented to person, place, and time.  Skin: Skin is warm and dry. No rash noted. She is not diaphoretic. No erythema. No pallor.  Psychiatric: She has a normal mood and affect. Her behavior is normal. Judgment and thought content normal.    Assessment & Plan:   Please see problem list for problem-based assessment and plan

## 2015-06-01 NOTE — Assessment & Plan Note (Addendum)
-  Pt has not yet had shingles vaccine at her pharmacy or returned home FOBT cards -Inquire regarding screening mammogram and DEXA at next visit

## 2015-06-01 NOTE — Assessment & Plan Note (Signed)
Assessment: Pt with last A1c of 9 on 03/01/15 compliant with insulin and oral hypoglycemic therapy with no recent symptomatic hypoglycemia who presents with blood glucose of 187 and mildly improved A1c of 8.7.   Plan:  -A1c 8.7 not at goal <7.0, Start byetta 5 mcg BID andcontinue Humalog Mix 75/25 26 U BID and metformin 1000 mg BID.  -BP 141/65 not at goal <140/90, continue lisinopril-HCTZ 20-12.5 mg BID and metoprolol succinate 100 mg daily  -LDL 112 not at goal <100, pt with tolerance to statin therapy and declines restarting or alternative therapy at this time, continue lifestyle modification     -Last annual eye exam on 06/06/14 with proliferative diabetic retinopathy and macular edema, will need repeat soon    -Last annual foot exam on 03/01/15 -Last annual urine microalbumin test on 03/01/15 with no proteinuria  -BMI 35.56 not at goal <25, encourage weight loss -Continue aspirin 81 mg daily for primary CVD prevention

## 2015-06-01 NOTE — Assessment & Plan Note (Signed)
Assessment: Pt with moderately-well controlled hypertension compliant with three-class (ACEi, diuretic, BB) anti-hypertensive therapy who presents with improved blood pressure of 141/65.   Plan:  -BP 141/65 near goal <140/90 -Continue lisinopril-HCTZ 20-12.5 mg BID and metoprolol succinate 100 mg daily  -Last CMP on 03/01/15 with stable CKD Stage 2

## 2015-06-04 NOTE — Progress Notes (Signed)
[facility] Attending  Case discussed with Dr. Rabbani soon after the resident saw the patient.  We reviewed the resident's history and exam and pertinent patient test results.  I agree with the assessment, diagnosis, and plan of care documented in the resident's note.  

## 2015-06-26 ENCOUNTER — Other Ambulatory Visit: Payer: Self-pay | Admitting: Internal Medicine

## 2015-06-26 DIAGNOSIS — E113599 Type 2 diabetes mellitus with proliferative diabetic retinopathy without macular edema, unspecified eye: Secondary | ICD-10-CM

## 2015-06-26 NOTE — Telephone Encounter (Signed)
exenatide (BYETTA 5 MCG PEN) 5 MCG/0.02ML SOPN injection [facility] [address]

## 2015-06-27 ENCOUNTER — Other Ambulatory Visit: Payer: Self-pay | Admitting: Internal Medicine

## 2015-06-27 MED ORDER — EXENATIDE 5 MCG/0.02ML ~~LOC~~ SOPN: 5.0000 ug | PEN_INJECTOR | Freq: Two times a day (BID) | SUBCUTANEOUS | Status: DC

## 2015-07-02 ENCOUNTER — Encounter: Payer: Self-pay | Admitting: *Deleted

## 2015-07-12 ENCOUNTER — Telehealth: Payer: Self-pay | Admitting: Internal Medicine

## 2015-07-12 DIAGNOSIS — E113599 Type 2 diabetes mellitus with proliferative diabetic retinopathy without macular edema, unspecified eye: Secondary | ICD-10-CM

## 2015-07-12 MED ORDER — GLUCOSE BLOOD VI STRP: ORAL_STRIP | Status: DC

## 2015-07-15 MED ORDER — GLUCOSE BLOOD VI STRP: ORAL_STRIP | Status: DC

## 2015-07-15 NOTE — Telephone Encounter (Signed)
Thanks [employer]aye.  Agree with new Rx.

## 2015-07-15 NOTE — Addendum Note (Signed)
Addended by: Maura CrandallGOLDSTON, DARLENE C on: 07/15/2015 03:38 PM   Modules accepted: Orders

## 2015-07-15 NOTE — Telephone Encounter (Addendum)
Received faxed message from pt's pharmacy stating they cant process rx for dm testing supplies because ordering MD is not enrolled in "[employer]" and asked that a new rx be sent to pharmacy with a different provider.  Will submit new rx under the attending MD Criselda Peaches([place]) name.Kingsley SpittleGoldston, Terri Rorrer Cassady7/10/20173:34 PM

## 2015-07-19 ENCOUNTER — Other Ambulatory Visit: Payer: Self-pay | Admitting: Internal Medicine

## 2015-07-19 DIAGNOSIS — E113599 Type 2 diabetes mellitus with proliferative diabetic retinopathy without macular edema, unspecified eye: Secondary | ICD-10-CM

## 2015-07-26 ENCOUNTER — Telehealth: Payer: Self-pay | Admitting: Internal Medicine

## 2015-07-26 DIAGNOSIS — E113599 Type 2 diabetes mellitus with proliferative diabetic retinopathy without macular edema, unspecified eye: Secondary | ICD-10-CM

## 2015-07-26 DIAGNOSIS — J309 Allergic rhinitis, unspecified: Secondary | ICD-10-CM

## 2015-08-06 NOTE — Telephone Encounter (Signed)
Dr. Samuella Cota is booked for the Aug and Sep.  She will be in [facility] in October, but that schedule is not available.  Forwarding back to triage and pcp to let the know.

## 2015-08-22 ENCOUNTER — Other Ambulatory Visit: Payer: Self-pay | Admitting: Internal Medicine

## 2015-08-22 DIAGNOSIS — E113599 Type 2 diabetes mellitus with proliferative diabetic retinopathy without macular edema, unspecified eye: Secondary | ICD-10-CM

## 2015-08-22 DIAGNOSIS — J309 Allergic rhinitis, unspecified: Secondary | ICD-10-CM

## 2015-08-22 DIAGNOSIS — I10 Essential (primary) hypertension: Secondary | ICD-10-CM

## 2015-08-22 MED ORDER — CETIRIZINE HCL 10 MG PO TABS: 10.0000 mg | ORAL_TABLET | Freq: Every day | ORAL | 0 refills | Status: DC

## 2015-08-22 MED ORDER — LISINOPRIL-HYDROCHLOROTHIAZIDE 20-12.5 MG PO TABS: 1.0000 | ORAL_TABLET | Freq: Two times a day (BID) | ORAL | 0 refills | Status: DC

## 2015-08-22 MED ORDER — FLUTICASONE PROPIONATE 50 MCG/ACT NA SUSP: 2.0000 | Freq: Every day | NASAL | 3 refills | Status: DC

## 2015-08-22 MED ORDER — METFORMIN HCL 1000 MG PO TABS: 1000.0000 mg | ORAL_TABLET | Freq: Two times a day (BID) | ORAL | 3 refills | Status: DC

## 2015-08-22 MED ORDER — METOPROLOL SUCCINATE ER 100 MG PO TB24: ORAL_TABLET | ORAL | 0 refills | Status: DC

## 2015-08-22 MED ORDER — EXENATIDE 5 MCG/0.02ML ~~LOC~~ SOPN: 5.0000 ug | PEN_INJECTOR | Freq: Two times a day (BID) | SUBCUTANEOUS | 2 refills | Status: DC

## 2015-08-22 NOTE — Telephone Encounter (Signed)
Requesting all meds to be filled.  

## 2015-08-22 NOTE — Telephone Encounter (Signed)
Has appointment 10/24/2015

## 2015-09-02 DIAGNOSIS — H43823 Vitreomacular adhesion, bilateral: Secondary | ICD-10-CM | POA: Diagnosis not present

## 2015-09-02 DIAGNOSIS — E113592 Type 2 diabetes mellitus with proliferative diabetic retinopathy without macular edema, left eye: Secondary | ICD-10-CM | POA: Diagnosis not present

## 2015-09-02 DIAGNOSIS — E113591 Type 2 diabetes mellitus with proliferative diabetic retinopathy without macular edema, right eye: Secondary | ICD-10-CM | POA: Diagnosis not present

## 2015-09-02 DIAGNOSIS — H211X1 Other vascular disorders of iris and ciliary body, right eye: Secondary | ICD-10-CM | POA: Diagnosis not present

## 2015-09-02 DIAGNOSIS — H211X2 Other vascular disorders of iris and ciliary body, left eye: Secondary | ICD-10-CM | POA: Diagnosis not present

## 2015-09-25 ENCOUNTER — Other Ambulatory Visit: Payer: Self-pay | Admitting: Internal Medicine

## 2015-09-25 DIAGNOSIS — E113599 Type 2 diabetes mellitus with proliferative diabetic retinopathy without macular edema, unspecified eye: Secondary | ICD-10-CM

## 2015-09-25 DIAGNOSIS — J309 Allergic rhinitis, unspecified: Secondary | ICD-10-CM

## 2015-09-26 ENCOUNTER — Other Ambulatory Visit: Payer: Self-pay | Admitting: *Deleted

## 2015-09-26 DIAGNOSIS — E113599 Type 2 diabetes mellitus with proliferative diabetic retinopathy without macular edema, unspecified eye: Secondary | ICD-10-CM

## 2015-09-26 MED ORDER — ACCU-CHEK FASTCLIX LANCETS MISC: 5 refills | Status: DC

## 2015-09-26 NOTE — Telephone Encounter (Signed)
called [facility] - stated Dr Samuella CotaSvalina not Medicare approved; need new rx sent. Thanks

## 2015-10-24 ENCOUNTER — Ambulatory Visit (INDEPENDENT_AMBULATORY_CARE_PROVIDER_SITE_OTHER): Payer: Medicare Other | Admitting: Internal Medicine

## 2015-10-24 ENCOUNTER — Encounter: Payer: Self-pay | Admitting: Internal Medicine

## 2015-10-24 VITALS — BP 126/62 | HR 78 | Temp 98.1°F | Ht 63.0 in | Wt 190.6 lb

## 2015-10-24 DIAGNOSIS — Z7982 Long term (current) use of aspirin: Secondary | ICD-10-CM

## 2015-10-24 DIAGNOSIS — Z0000 Encounter for general adult medical examination without abnormal findings: Secondary | ICD-10-CM

## 2015-10-24 DIAGNOSIS — K219 Gastro-esophageal reflux disease without esophagitis: Secondary | ICD-10-CM

## 2015-10-24 DIAGNOSIS — Z794 Long term (current) use of insulin: Secondary | ICD-10-CM

## 2015-10-24 DIAGNOSIS — E11319 Type 2 diabetes mellitus with unspecified diabetic retinopathy without macular edema: Secondary | ICD-10-CM

## 2015-10-24 DIAGNOSIS — Z1231 Encounter for screening mammogram for malignant neoplasm of breast: Secondary | ICD-10-CM

## 2015-10-24 DIAGNOSIS — E11649 Type 2 diabetes mellitus with hypoglycemia without coma: Secondary | ICD-10-CM | POA: Diagnosis not present

## 2015-10-24 DIAGNOSIS — N390 Urinary tract infection, site not specified: Secondary | ICD-10-CM | POA: Diagnosis not present

## 2015-10-24 DIAGNOSIS — I10 Essential (primary) hypertension: Secondary | ICD-10-CM

## 2015-10-24 DIAGNOSIS — F17220 Nicotine dependence, chewing tobacco, uncomplicated: Secondary | ICD-10-CM | POA: Diagnosis not present

## 2015-10-24 DIAGNOSIS — E113593 Type 2 diabetes mellitus with proliferative diabetic retinopathy without macular edema, bilateral: Secondary | ICD-10-CM | POA: Diagnosis not present

## 2015-10-24 DIAGNOSIS — R300 Dysuria: Secondary | ICD-10-CM | POA: Insufficient documentation

## 2015-10-24 DIAGNOSIS — E113599 Type 2 diabetes mellitus with proliferative diabetic retinopathy without macular edema, unspecified eye: Secondary | ICD-10-CM

## 2015-10-24 DIAGNOSIS — Z23 Encounter for immunization: Secondary | ICD-10-CM

## 2015-10-24 DIAGNOSIS — B9689 Other specified bacterial agents as the cause of diseases classified elsewhere: Secondary | ICD-10-CM | POA: Diagnosis not present

## 2015-10-24 DIAGNOSIS — Z79899 Other long term (current) drug therapy: Secondary | ICD-10-CM

## 2015-10-24 LAB — POCT URINALYSIS DIPSTICK
Bilirubin, UA: NEGATIVE
Ketones, UA: NEGATIVE
NITRITE UA: NEGATIVE
PH UA: 5.5
Spec Grav, UA: >=1.03
UROBILINOGEN UA: 0.2

## 2015-10-24 LAB — POCT GLYCOSYLATED HEMOGLOBIN (HGB A1C): HEMOGLOBIN A1C: 10.5

## 2015-10-24 LAB — GLUCOSE, CAPILLARY: GLUCOSE-CAPILLARY: 266 mg/dL — AB (ref 65–99)

## 2015-10-24 MED ORDER — NITROFURANTOIN MONOHYD MACRO 100 MG PO CAPS: 100.0000 mg | ORAL_CAPSULE | Freq: Two times a day (BID) | ORAL | 0 refills | Status: AC

## 2015-10-24 MED ORDER — PANTOPRAZOLE SODIUM 20 MG PO TBEC
20.0000 mg | DELAYED_RELEASE_TABLET | Freq: Every day | ORAL | 3 refills | Status: DC
Start: 2015-10-24 — End: 2016-09-23

## 2015-10-24 NOTE — Progress Notes (Signed)
   CC: diabetes  HPI:  [name] is a 65 y.o. with a PMH of CAD, insulin dependent T2DM, HTN, HLD, GERD, and allergic rhinitis presenting for follow-up of diabetes.  DM: Last A1c: 8.7 in May 2017. On BYetta 5mcg BID, Humalog Mix 75/25 26U BID and Metformin 1000mg  BID - patient states compliance with medications. She states that she has some hypoglycemic episodes, some in the afternoon and some will wake her up at night. She states that she eats breakfast around 9am and dinner around 6pm, and takes her injections with breakfast and then at 10pm. She does not check her blood sugar regularly. Retinal exam 08/2014 shows rapidly progressive retinopathy. Patient has been following with ophtho, Dr. Dione BoozeGroat and Dr. Luciana Axeankin for injections and laser treatments.  HTN: On lisinopril-HCTZ 20-12.5mg  BID, metoprolol 100mg  daily; patient states compliance. She denies chest pain, headaches, vision and hearing changes.   Plan on addressing Parkinson's diagnosis in history  Please see problem based Assessment and Plan for status of patients chronic conditions.  Past Medical History:  Diagnosis Date  . Diabetes mellitus without complication ([facility])   . GERD (gastroesophageal reflux disease)   . Hypertension   . Parkinson's disease ([facility])   . Shortness of breath     Review of Systems:   Review of Systems  Constitutional: Positive for diaphoresis (with hypoglycemic episodes). Negative for chills and fever.  Eyes: Negative for blurred vision and double vision.  Respiratory: Negative for cough and shortness of breath.   Cardiovascular: Negative for chest pain, orthopnea and leg swelling.  Gastrointestinal: Positive for constipation, heartburn and nausea. Negative for abdominal pain, blood in stool, melena and vomiting.  Genitourinary: Positive for dysuria and frequency. Negative for hematuria.  Musculoskeletal: Positive for back pain (with exertion).  Neurological: Positive for dizziness and tremors  (with hypoglycemic episodes). Negative for headaches.    Physical Exam:  Vitals:   10/24/15 1352  BP: 126/62  Pulse: 78  Temp: 98.1 F (36.7 C)  TempSrc: Oral  SpO2: 98%  Weight: 190 lb 9.6 oz (86.5 kg)  Height: 5\' 3"  (1.6 m)   Physical Exam Constitutional: NAD, pleasant CV: RRR, no murmurs, rubs or gallops appreciated, minimal bil LE edema, pulses intact Resp: CTAB, no increased work of breathing, no wheezing or crackles appreciated Abd: soft, +BS, non distended, nontender MSK: moves all 4 extremities freely, strength intact throughout, no spinal tenderness, mild left sided paraspinal tenderness in lower thoracic area Assessment & Plan:   See Encounters Tab for problem based charting.   Patient seen with Dr. Argentina PonderGranfortuna   Destiny Okelly, MD Internal Medicine PGY1

## 2015-10-24 NOTE — Patient Instructions (Addendum)
For your diabetes: -we checked your A1c today -continue your metformin, byetta twice a day, and Humalog 24U twice a day -Make sure you take the insulin with breakfast and dinner to avoid the low blood sugars  We talked about decreasing your soda intake and switching to flavored water.  For your burning with urination, I will send in an antibiotic  -Take it twice a day for 5 days -If your symptoms don't resolve or you develop a fever, please call the clinic and we will see you  For your heart burn: -we will start a medicine called Protonix. Take it 30 minutes before your first meal -avoid foods that give you more acid reflux -don't lay down at least 3 hours after your last meal or snack

## 2015-10-25 ENCOUNTER — Telehealth: Payer: Self-pay | Admitting: Internal Medicine

## 2015-10-25 DIAGNOSIS — R300 Dysuria: Secondary | ICD-10-CM

## 2015-10-25 LAB — URINALYSIS, ROUTINE W REFLEX MICROSCOPIC
Bilirubin, UA: NEGATIVE
Nitrite, UA: NEGATIVE
PROTEIN UA: NEGATIVE
RBC, UA: NEGATIVE
SPEC GRAV UA: 1.018 (ref 1.005–1.030)
UUROB: 0.2 mg/dL (ref 0.2–1.0)
pH, UA: 5 (ref 5.0–7.5)

## 2015-10-25 LAB — MICROSCOPIC EXAMINATION
Casts: NONE SEEN /lpf
RBC, UA: NONE SEEN /hpf (ref 0–?)

## 2015-10-25 MED ORDER — LISINOPRIL-HYDROCHLOROTHIAZIDE 20-12.5 MG PO TABS: 1.0000 | ORAL_TABLET | Freq: Two times a day (BID) | ORAL | 1 refills | Status: DC

## 2015-10-25 MED ORDER — INSULIN PEN NEEDLE 32G X 4 MM MISC: 3 refills | Status: DC

## 2015-10-25 MED ORDER — METOPROLOL SUCCINATE ER 100 MG PO TB24: ORAL_TABLET | ORAL | 0 refills | Status: DC

## 2015-10-25 MED ORDER — EXENATIDE 5 MCG/0.02ML ~~LOC~~ SOPN: 5.0000 ug | PEN_INJECTOR | Freq: Two times a day (BID) | SUBCUTANEOUS | 5 refills | Status: DC

## 2015-10-25 MED ORDER — FLUCONAZOLE 150 MG PO TABS: 150.0000 mg | ORAL_TABLET | Freq: Once | ORAL | 0 refills | Status: AC

## 2015-10-25 MED ORDER — INSULIN LISPRO PROT & LISPRO (75-25 MIX) 100 UNIT/ML KWIKPEN: PEN_INJECTOR | SUBCUTANEOUS | 1 refills | Status: DC

## 2015-10-25 NOTE — Assessment & Plan Note (Addendum)
Patient with HTN controlled with lisinopril-HCTZ 20/12.5mg  daily and metoprolol 100mg  daily. She is tolerating treatment well. BP in clinic 126/62.   Plan: --continue current regimen

## 2015-10-25 NOTE — Assessment & Plan Note (Addendum)
Patient with acid reflux not on PPI. She states worsening of symptoms with spicy foods and with lying down. Patient has been taking TUMS with suboptimal results.   Plan: --start protonix 20mg  daily 30 mins before breakfast --discussed foods and drinks to avoid, avoiding lying down at least 3 hours after meal --will monitor

## 2015-10-25 NOTE — Assessment & Plan Note (Addendum)
Patient lost FOBT cards and request replacements. She would like a mammogram. She received a flu shot today.

## 2015-10-25 NOTE — Assessment & Plan Note (Signed)
Patient with 2 week history of dysuria, with no discharge, no vaginal itching or irritation, no suprapubic pain, no hematuria. She does endorse some left sided flank pain but it is muscular in nature on exam. She denies fevers, chills, and nausea.   Plan: --UA with microscopy: shows leukocytes, blood, bacteria and yeast - started patient on nitrofurantoin x5 days; called patient with results and she states her symptoms are not as bad but still present but has been on therapy <1 full day. Provided patient with diflucan 150mg  to take once if symptoms don't improve further in next couple of days; will continue abx. Patient stated understanding.

## 2015-10-25 NOTE — Telephone Encounter (Signed)
Opened by mistake.

## 2015-10-25 NOTE — Assessment & Plan Note (Signed)
Patient with T2DM on Byetta 5mg  BID, Humalog Mix 75/25U BID and Metformin 1000mg  BID. Last a1c was 8.7 in May 2017. She reports hypoglycemic episodes that seem to occur in afternoon and while asleep, likely caused by her not eating lunch and taking PM doses of insulin at 10pm hours after dinner.   Plan: --check A1c: 10.5 - worsening even with adding byetta; emphasized need for compliance to medicine --We discussed in detail the need for consistent meals three times a day and moving the timing of her PM doses to take with dinner. Patient is agreeable.  --discussed need for consistent CBG monitoring so we can make more specific changes to her regimen; patient agreed to check her CBG's multiple times a day and to bring in meter to next visit --we discussed diet changes for better control of her diabetes; patient states that she will start cutting back on her soda consumption and start drinking flavored water instead. Will discuss further changes at next visit. --f/u in 1 month

## 2015-10-28 NOTE — Progress Notes (Signed)
Medicine attending: I personally interviewed and briefly examined this patient on the day of the patient visit and reviewed pertinent clinical ,laboratory, and radiographic data  with resident physician Dr. Samuella CotaSvalina and we discussed a management plan. Patient was instructed on a better way to coordinate her insulin injections with meals to avoid hypoglycemia & advised not to drink sugar containing beverages. Nitrofurantoin prescribe for a symptomatic UTI.

## 2015-10-29 ENCOUNTER — Telehealth: Payer: Self-pay

## 2015-10-29 NOTE — Telephone Encounter (Signed)
exenatide (BYETTA 5 MCG PEN)  is not covered by the insurance, please call pt back.

## 2015-11-05 ENCOUNTER — Telehealth: Payer: Self-pay | Admitting: *Deleted

## 2015-11-05 NOTE — Telephone Encounter (Signed)
Discussed switching from exenatide to liraglutide, approved by Dr. Samuella CotaSvalina.  Tried calling patient to notify/schedule appointment, no answer. Left message, will call back tomorrow.

## 2015-11-05 NOTE — Telephone Encounter (Signed)
Patient stated the price issue on exenatide was resolved. Advised patient to contact me if further issues arise.

## 2015-11-05 NOTE — Telephone Encounter (Signed)
Called pt per the Geriatric Task Force for medication management. Pt stated she takes 2 different types of insulin plus Metformin. She checks her blood glucose twice daily. And lately they have been "high" - as high as 255.Voiced she takes 2 types of medication for her BP. She does not check her BP at home. She fills her rxs without any difficulty; has had trouble with her pharmacy. Stated sometimes she exercise; "when I feel like doing it". And stated she does watch her diet. Voiced no concerns.  She has an appt scheduled 11/25/15@ 1315PM.

## 2015-11-25 ENCOUNTER — Encounter: Payer: Self-pay | Admitting: Internal Medicine

## 2015-11-25 ENCOUNTER — Ambulatory Visit (INDEPENDENT_AMBULATORY_CARE_PROVIDER_SITE_OTHER): Payer: Medicare Other | Admitting: Internal Medicine

## 2015-11-25 VITALS — BP 134/60 | HR 80 | Temp 98.4°F | Ht 62.5 in | Wt 194.5 lb

## 2015-11-25 DIAGNOSIS — M25511 Pain in right shoulder: Secondary | ICD-10-CM | POA: Diagnosis not present

## 2015-11-25 DIAGNOSIS — F17290 Nicotine dependence, other tobacco product, uncomplicated: Secondary | ICD-10-CM

## 2015-11-25 DIAGNOSIS — R300 Dysuria: Secondary | ICD-10-CM

## 2015-11-25 DIAGNOSIS — Z794 Long term (current) use of insulin: Secondary | ICD-10-CM | POA: Diagnosis not present

## 2015-11-25 DIAGNOSIS — G8929 Other chronic pain: Secondary | ICD-10-CM | POA: Diagnosis not present

## 2015-11-25 DIAGNOSIS — M79674 Pain in right toe(s): Secondary | ICD-10-CM

## 2015-11-25 DIAGNOSIS — E1165 Type 2 diabetes mellitus with hyperglycemia: Secondary | ICD-10-CM | POA: Diagnosis not present

## 2015-11-25 DIAGNOSIS — Z7982 Long term (current) use of aspirin: Secondary | ICD-10-CM | POA: Diagnosis not present

## 2015-11-25 MED ORDER — MELOXICAM 7.5 MG PO TABS: 7.5000 mg | ORAL_TABLET | Freq: Every day | ORAL | 0 refills | Status: DC

## 2015-11-25 MED ORDER — POLYETHYLENE GLYCOL 3350 17 G PO PACK: 17.0000 g | PACK | Freq: Every day | ORAL | Status: DC

## 2015-11-25 MED ORDER — BACITRACIN-NEOMYCIN-POLYMYXIN 400-5-5000 EX OINT: 1.0000 "application " | TOPICAL_OINTMENT | Freq: Two times a day (BID) | CUTANEOUS | 11 refills | Status: DC

## 2015-11-25 MED ORDER — POLYETHYLENE GLYCOL 3350 17 G PO PACK: 17.0000 g | PACK | Freq: Every day | ORAL | 11 refills | Status: AC

## 2015-11-25 NOTE — Progress Notes (Signed)
   CC: Urinary tract infection  HPI:  Ms.Tian Judie PetitM Vaughan Bastaegram is a 65 y.o. female who presents today with her son for urinary tract infection. Please see assessment & plan for status of chronic medical problems.   Past Medical History:  Diagnosis Date  . Diabetes mellitus without complication (HCC)   . GERD (gastroesophageal reflux disease)   . Hypertension   . Parkinson's disease (HCC)   . Shortness of breath     Review of Systems:  Please see each problem below for a pertinent review of systems.   Physical Exam:  Vitals:   11/25/15 1326  BP: 134/60  Pulse: 80  Temp: 98.4 F (36.9 C)  TempSrc: Oral  SpO2: 99%  Weight: 194 lb 8 oz (88.2 kg)  Height: 5' 2.5" (1.588 m)   Physical Exam  Constitutional: She is oriented to person, place, and time. No distress.  HENT:  Head: Normocephalic and atraumatic.  Eyes: Conjunctivae are normal. No scleral icterus.  Pulmonary/Chest: Effort normal. No respiratory distress.  Neurological: She is alert and oriented to person, place, and time.  Skin: She is not diaphoretic.  Right toe with some irritation on the side. Mild serosanguineous drainage noted at the left lateral aspect of the cuticle where it meets the skin. No surrounding erythema, warmth. 2+ dorsalis pedis pulses bilaterally     Assessment & Plan:   See Encounters Tab for problem based charting.  Patient discussed with Dr. Criselda PeachesMullen           .

## 2015-11-25 NOTE — Patient Instructions (Signed)
For the feet, apply bacitracin ointment to the toe and cover it with a bandage. Wear comfortable shoes to allow for the air to move in/out.  For the urine symptoms, try MiraLax daily so you have bowel movements every other day.   For the shoulder pain, try meloxicam daily and heat pads.

## 2015-11-26 DIAGNOSIS — S91209A Unspecified open wound of unspecified toe(s) with damage to nail, initial encounter: Secondary | ICD-10-CM

## 2015-11-26 HISTORY — DX: Unspecified open wound of unspecified toe(s) with damage to nail, initial encounter: S91.209A

## 2015-11-26 NOTE — Assessment & Plan Note (Addendum)
Assessment For many months, she has had waxing and waning pain of her right shoulder. Her pain is most pronounced when she tries to raise her arms up. She has never tried a steroid injection though Tylenol does not seem to alleviate her pain. Is getting in the way of her ability to perform housework, and she would like some relief.  I was unable to fully examine her shoulder today, but possible etiologies include adhesive capsulitis, rotator cuff tendinopathy, osteoarthritis.  Plan -Prescribed meloxicam 7.5 mg to be taken daily 10 tablets -Recommended heat pads to see how it helps

## 2015-11-26 NOTE — Assessment & Plan Note (Addendum)
Assessment After her last office visit, she completed her course of antibiotics and antifungal medications and felt that her dysuria and increased frequency improved. Her improvement was short-lived but after few days, she had recurrent symptoms, most notably over the last 2-3 weeks. She acknowledges that her sugars have been running 200s to 300s and is wondering if she has a recurrent infection. She has been without fevers, chills or poor appetite though has infrequent bowel movements every couple days.  Urinalysis in the office was notable for the following values. Glucose negative Bilirubin negative Ketones negative Specific gravity 1.015 Blood trace [lysed] PH 5.5 Protein negative Urobilinogen 1.0 Nitrites negative Leukocytes small  Urinalysis does not appear consistent with a urinary tract infection nor does there appear to be glucose in her urine. At this point, I wonder if constipation is contributory to her symptoms. She could also have interstitial cystitis which would be amenable to any NSAID therapy she is to receive for her shoulder pain.  Plan -Recommended she start daily MiraLAX to ensure she is having bowel movements at least every other day -If no better at follow-up, a pelvic exam would be the next step of assessment

## 2015-11-26 NOTE — Assessment & Plan Note (Signed)
Assessment Last month, she has been having pain over right toe. It was unbearable at her last office visit though has since improved. She does report some drainage from the toe but is without fevers, chills, decreased appetite. She does not follow with podiatrist but treats her feet with warm soaks.  My exam is reassuring for no diabetic foot infection though she is certainly at risk given her poorly controlled diabetes [A1c 10.5, October 2017].  Plan -Recommended she wear comfortable shoes to avoid repeat trauma and decrease her risk for foot infections -Prescribed Neosporin cream to be applied every 12 hours to the toe and to be covered with bandage. We provided her with a sample from our office.

## 2015-12-01 NOTE — Progress Notes (Signed)
Internal Medicine Clinic Attending  Case discussed with Dr. Patel,Rushil soon after the resident saw the patient.  We reviewed the resident's history and exam and pertinent patient test results.  I agree with the assessment, diagnosis, and plan of care documented in the resident's note. 

## 2015-12-04 ENCOUNTER — Telehealth: Payer: Self-pay

## 2015-12-06 NOTE — Progress Notes (Signed)
Destiny Russell is a 65 y.o. female who was contacted on [facility]. [facility] patient has been refilling HTN and DM medications consistently.

## 2016-01-02 DIAGNOSIS — H43811 Vitreous degeneration, right eye: Secondary | ICD-10-CM | POA: Diagnosis not present

## 2016-01-02 DIAGNOSIS — E113592 Type 2 diabetes mellitus with proliferative diabetic retinopathy without macular edema, left eye: Secondary | ICD-10-CM | POA: Diagnosis not present

## 2016-01-02 DIAGNOSIS — E113591 Type 2 diabetes mellitus with proliferative diabetic retinopathy without macular edema, right eye: Secondary | ICD-10-CM | POA: Diagnosis not present

## 2016-01-02 DIAGNOSIS — H35372 Puckering of macula, left eye: Secondary | ICD-10-CM | POA: Diagnosis not present

## 2016-01-02 DIAGNOSIS — H4311 Vitreous hemorrhage, right eye: Secondary | ICD-10-CM | POA: Diagnosis not present

## 2016-01-15 DIAGNOSIS — E113511 Type 2 diabetes mellitus with proliferative diabetic retinopathy with macular edema, right eye: Secondary | ICD-10-CM | POA: Diagnosis not present

## 2016-01-15 DIAGNOSIS — E113591 Type 2 diabetes mellitus with proliferative diabetic retinopathy without macular edema, right eye: Secondary | ICD-10-CM | POA: Diagnosis not present

## 2016-01-15 DIAGNOSIS — H4311 Vitreous hemorrhage, right eye: Secondary | ICD-10-CM | POA: Diagnosis not present

## 2016-01-27 ENCOUNTER — Other Ambulatory Visit: Payer: Self-pay | Admitting: Internal Medicine

## 2016-01-27 DIAGNOSIS — J309 Allergic rhinitis, unspecified: Secondary | ICD-10-CM

## 2016-01-27 DIAGNOSIS — E113599 Type 2 diabetes mellitus with proliferative diabetic retinopathy without macular edema, unspecified eye: Secondary | ICD-10-CM

## 2016-01-27 MED ORDER — GLUCOSE BLOOD VI STRP: ORAL_STRIP | 5 refills | Status: DC

## 2016-01-27 MED ORDER — ACCU-CHEK FASTCLIX LANCETS MISC: 5 refills | Status: DC

## 2016-02-05 ENCOUNTER — Other Ambulatory Visit: Payer: Self-pay

## 2016-02-05 DIAGNOSIS — E113599 Type 2 diabetes mellitus with proliferative diabetic retinopathy without macular edema, unspecified eye: Secondary | ICD-10-CM

## 2016-02-05 MED ORDER — ACCU-CHEK FASTCLIX LANCETS MISC: 5 refills | Status: DC

## 2016-02-05 MED ORDER — GLUCOSE BLOOD VI STRP
ORAL_STRIP | 5 refills | Status: DC
Start: 2016-02-05 — End: 2016-02-25

## 2016-02-05 NOTE — Telephone Encounter (Signed)
ACCU-CHEK FASTCLIX LANCETS MISC, glucose blood (ACCU-CHEK SMARTVIEW) test strip, Refill request @ [facility] on [address].

## 2016-02-05 NOTE — Telephone Encounter (Signed)
Called pt, she states pharm told her that scripts were not done, called pharm, they say dr Samuella Cotasvalina is not PECOS registered, called doriss. She states she just had to resubmit dr svalina's info again this week, will send to attending Until paperwork goes through dr svalina's medicare scripts will need to be done by an attending

## 2016-02-06 ENCOUNTER — Telehealth: Payer: Self-pay | Admitting: *Deleted

## 2016-02-06 NOTE — Telephone Encounter (Signed)
Spoke to son, he states [facility] has not gotten new script that dr is not on PECOS. Put him on hold, called pharm, spoke to pharmacist, he states that it is insurance medicare went to part b, part b sent to part d part d will not pay for accuchek, he will call them and find out what medicare part d will pay for and fax to triage, will need new scripts

## 2016-02-10 ENCOUNTER — Telehealth: Payer: Self-pay | Admitting: *Deleted

## 2016-02-10 NOTE — Telephone Encounter (Signed)
Destiny Lennice SitesM Destiny Russell is a 66 y.o. female who was contacted on behalf of [facility].   Diabetes Assessment  DM meds and BS checks -  "What medications are you taking for diabetes? States she takes insulin 75/25 28 units twice a day; Byetta 5 units twice a day and Metformin 1000 mg twice a day.  -  "How often are you checking your blood sugars at home?"States twice a day.  - "What have your blood sugars been? States "sometimes they are alright;other times they are high". States in the 300's.  High Blood Pressure Assessment  BP meds & BP checks -  "What medications are you taking for high blood pressure?" States Lisinopril 20/12.5 mg twice a day and Metoprolol 100 mg every morning.  - "How often are you checking your blood pressure at home? "from time to time' at local [facility]. Has not check lately. -   Coping with DM and BP - What else are you doing to help yourself with your DM and BP - Diet? "fairly well;I don't overeat"  - Exercise? No, states she hasn't felt like going out or doing anything.  Medication Access Issues  Medication Issues? -  "Are you getting your medicines refilled on time without skipping any doses? "Yes".  - "Are you having any problems getting/taking your medicines?  States no - not now. Apparently had a problem w/[facility].  Conclusion  Close the call - "Do you have your next appointment scheduled? NO -   give pt date and time if needed (make appt for them if needed before calling) Appt scheduled for 3/20 with Dr Samuella CotaSvalina.  - "Any other questions or concerns?"No - "Thanks for speaking with me today.                Glenda Palmer,RN

## 2016-02-25 ENCOUNTER — Other Ambulatory Visit: Payer: Self-pay | Admitting: Internal Medicine

## 2016-02-25 DIAGNOSIS — E113599 Type 2 diabetes mellitus with proliferative diabetic retinopathy without macular edema, unspecified eye: Secondary | ICD-10-CM

## 2016-02-26 ENCOUNTER — Telehealth: Payer: Self-pay | Admitting: *Deleted

## 2016-02-26 NOTE — Telephone Encounter (Addendum)
Information was sent to CoverMyMeds for Prior Authorization for Byetta. Awaiting decision in 72 hours.  Angelina OkGladys Diesha Rostad, RN 02/26/2016 3:50 PM.  Fax received from [employer] insurance company.  Patient will need to try and fail  Bydureon vials , Bydureon Pen or Trulicity which are on the preferred list.  Message to be sent to Dr. Samuella CotaSvalina .  Angelina OkGladys Archita Lomeli, RN 07-10-202018 10:03 AM  Prescription for Trulicity was sent to patient's pharmacy per Dr. Samuella CotaSvalina.  Angelina OkGladys Solomiya Pascale, RN 02/28/2016 2:17 PM

## 2016-02-27 MED ORDER — DULAGLUTIDE 0.75 MG/0.5ML ~~LOC~~ SOAJ: 0.7500 mg | SUBCUTANEOUS | 2 refills | Status: DC

## 2016-03-24 ENCOUNTER — Encounter: Payer: Self-pay | Admitting: Internal Medicine

## 2016-03-24 ENCOUNTER — Ambulatory Visit (INDEPENDENT_AMBULATORY_CARE_PROVIDER_SITE_OTHER): Payer: Medicare Other | Admitting: Internal Medicine

## 2016-03-24 VITALS — BP 133/57 | HR 78 | Temp 97.8°F | Ht 62.5 in | Wt 187.8 lb

## 2016-03-24 DIAGNOSIS — Z7982 Long term (current) use of aspirin: Secondary | ICD-10-CM

## 2016-03-24 DIAGNOSIS — K219 Gastro-esophageal reflux disease without esophagitis: Secondary | ICD-10-CM | POA: Diagnosis not present

## 2016-03-24 DIAGNOSIS — E1140 Type 2 diabetes mellitus with diabetic neuropathy, unspecified: Secondary | ICD-10-CM

## 2016-03-24 DIAGNOSIS — E113599 Type 2 diabetes mellitus with proliferative diabetic retinopathy without macular edema, unspecified eye: Secondary | ICD-10-CM | POA: Diagnosis not present

## 2016-03-24 DIAGNOSIS — M79674 Pain in right toe(s): Secondary | ICD-10-CM | POA: Diagnosis not present

## 2016-03-24 DIAGNOSIS — E782 Mixed hyperlipidemia: Secondary | ICD-10-CM

## 2016-03-24 DIAGNOSIS — K5900 Constipation, unspecified: Secondary | ICD-10-CM

## 2016-03-24 DIAGNOSIS — F17290 Nicotine dependence, other tobacco product, uncomplicated: Secondary | ICD-10-CM

## 2016-03-24 DIAGNOSIS — G8929 Other chronic pain: Secondary | ICD-10-CM | POA: Diagnosis not present

## 2016-03-24 DIAGNOSIS — E2839 Other primary ovarian failure: Secondary | ICD-10-CM | POA: Diagnosis not present

## 2016-03-24 DIAGNOSIS — I10 Essential (primary) hypertension: Secondary | ICD-10-CM

## 2016-03-24 DIAGNOSIS — Z794 Long term (current) use of insulin: Secondary | ICD-10-CM | POA: Diagnosis not present

## 2016-03-24 DIAGNOSIS — Z1231 Encounter for screening mammogram for malignant neoplasm of breast: Secondary | ICD-10-CM

## 2016-03-24 DIAGNOSIS — M25511 Pain in right shoulder: Secondary | ICD-10-CM

## 2016-03-24 DIAGNOSIS — Z0000 Encounter for general adult medical examination without abnormal findings: Secondary | ICD-10-CM

## 2016-03-24 DIAGNOSIS — Z13820 Encounter for screening for osteoporosis: Secondary | ICD-10-CM

## 2016-03-24 DIAGNOSIS — E1165 Type 2 diabetes mellitus with hyperglycemia: Secondary | ICD-10-CM | POA: Diagnosis present

## 2016-03-24 LAB — GLUCOSE, CAPILLARY: GLUCOSE-CAPILLARY: 246 mg/dL — AB (ref 65–99)

## 2016-03-24 LAB — POCT GLYCOSYLATED HEMOGLOBIN (HGB A1C): Hemoglobin A1C: 10.8

## 2016-03-24 MED ORDER — EMPAGLIFLOZIN 10 MG PO TABS: 10.0000 mg | ORAL_TABLET | Freq: Every day | ORAL | 1 refills | Status: DC

## 2016-03-24 MED ORDER — GABAPENTIN 300 MG PO CAPS: 300.0000 mg | ORAL_CAPSULE | Freq: Every day | ORAL | 2 refills | Status: DC

## 2016-03-24 MED ORDER — DULAGLUTIDE 1.5 MG/0.5ML ~~LOC~~ SOAJ: 1.5000 mg | SUBCUTANEOUS | 2 refills | Status: DC

## 2016-03-24 NOTE — Patient Instructions (Addendum)
For your diabetes, we will increase your trulicity to 1.5mg  daily (new script sent to [facility] as well as start empagliflozin 10mg  daily.  For your shoulder pain, we will get an X ray; continue using heat, cream and tylenol for it for now.   I sent a referral to a foot doctor.   You will be contacted for your mammogram and bone scan appointment.  I will see you in 3 months to see how your diabetes is doing. Please bring your meter in and check your sugars a couple of times a day.

## 2016-03-24 NOTE — Telephone Encounter (Signed)
Was not sure if this was resolved , so called pharmacy. He said patient picked up 100 accu chek smartview test strips and fastclix lancets on March 5th and they were covered at 100%.

## 2016-03-24 NOTE — Progress Notes (Signed)
CC: diabets  HPI:  [name] is a 66 y.o. with a PMH of T2DM, HTN, GERD, presenting to clinic for follow up on her diabetes.   T2DM:  Patient on metformin 1000mg  BID, Humalog mix 75/25 26 units BID though taking 30 units qAM and 20 units qPM, and Trulicity 0.75mg  weekly. Patient was previously on byetta but had to be switched to trulicity due to insurance preference. Last A1c was 10.5 in Oct 2017. Patient states that she changes her insulin regimen after feeling tired and dizzy for a few days about 2 weeks ago; since making the switch she has felt a lot better. She checks her CBG's about every 2 days and states they are a little bit higher (200s) since switching. She endorses bilateral LE numbness and tingling in her feet. She has been replacing her usual sodas and other drinks with water and has felt an overall benefit in how she feels. Her activity is low; she is considering increasing activity once weather is nicer.   HTN: Patient on lisinopril-HCTZ 40-25mg  daily and metoprolol 100mg  daily. She states she feels her BP has been well controlled; she denies chest pain. She states since adjusting her insulin she has had less shortness of breath with exertion and less dizziness. She denies headaches, vision or hearing changes.   Right shoulder pain: Patient with multi-year history of right shoulder pain with movement. She was given short course of meloxicam  In Nov 2017 which she states did not help relieve the pain. She states that heat does help as well as an OTC cream. The pain has been stable.   GERD:  Patient was started on protonix for acid reflux symptoms in Oct. She states that her symptoms are well controlled and that she has figured out which food are triggers and is avoiding them. She uses the omeprazole PRN. She denies abd pain, nausea, vomiting, melena.  Constipation: Patient has had improvement in constipation since addition of miralax. She has daily BM's now. She denies  abdominal pain, nausea, vomiting, melena or hematochezia. She reports a fair appetite which is normal for her.   Toe pain:  Patient was seen in Nov 2017 for pain in her right great toe; she had a small lesion that has been treated with topical Neosporin and was not thought to be infectious. Since then, she states her pain is improved but still has some tenderness; she endorses the nail falling off (though she did help pull it off prematurely) last week. She endorses stable redness, no wound, no fevers or chills or swelling.  Health Maintenance: Patient is due for her mammogram - she denies pain, tenderness, masses, skin changes, or nipple discharge. She is due for a Dexa scan. She is due for a pap smear - last one in 2007 was normal; she denies uterine bleeding, discomfort.   Please see problem based Assessment and Plan for status of patients chronic conditions.  Past Medical History:  Diagnosis Date  . Diabetes mellitus without complication (HCC)   . GERD (gastroesophageal reflux disease)   . Hypertension   . Parkinson's disease (HCC)   . Shortness of breath     Review of Systems:   Review of Systems  Constitutional: Positive for malaise/fatigue (improved since reducing insulin). Negative for chills and fever.  HENT: Negative for hearing loss.   Eyes: Negative for blurred vision and double vision.  Respiratory: Negative for cough and shortness of breath.   Cardiovascular: Negative for chest pain and leg  swelling.  Gastrointestinal: Positive for heartburn (occasional with trigger foods). Negative for abdominal pain, blood in stool, constipation, diarrhea, melena, nausea and vomiting.  Musculoskeletal: Positive for joint pain (right shoulder). Negative for myalgias.  Skin: Negative for rash.  Neurological: Positive for dizziness (intermittent), tingling (bil feet) and sensory change (numbness in bil feet). Negative for tremors, focal weakness and headaches.  Endo/Heme/Allergies: Negative  for polydipsia.    Physical Exam:  Vitals:   03/24/16 1534  BP: (!) 133/57  Pulse: 78  Temp: 97.8 F (36.6 C)  TempSrc: Oral  SpO2: 99%  Weight: 187 lb 12.8 oz (85.2 kg)  Height: 5' 2.5" (1.588 m)   Physical Exam  Constitutional: She is oriented to person, place, and time. She appears well-developed and well-nourished. No distress.  HENT:  Head: Normocephalic and atraumatic.  Eyes: Conjunctivae and EOM are normal.  Neck: Normal range of motion. Neck supple.  Cardiovascular: Normal rate, regular rhythm, normal heart sounds and intact distal pulses.  Exam reveals no gallop and no friction rub.   No murmur heard. 1+ bil DP and PT pulses  Pulmonary/Chest: Effort normal and breath sounds normal. She has no wheezes. She has no rales.  Abdominal: Soft. Bowel sounds are normal. She exhibits no distension and no mass. There is no tenderness.  Musculoskeletal:  Gait unremarkable R shoulder: strength and sensation intact; ROM limited by pain with abduction only; no swelling; no point tenderness  Neurological: She is alert and oriented to person, place, and time.  Skin: Skin is warm and dry. Capillary refill takes less than 2 seconds. She is not diaphoretic.  Right great toe without nail - clean granulation tissue without drainage. Some erythema surrounding nail bed with mild tenderness, no significant swelling.   Psychiatric: She has a normal mood and affect. Her behavior is normal. Judgment and thought content normal.    Assessment & Plan:   See Encounters Tab for problem based charting.   Patient discussed with Dr. Cleda DaubE. Hoffman   Destiny MarketGorica Destiny Swetz, MD Internal Medicine PGY1

## 2016-03-26 DIAGNOSIS — E2839 Other primary ovarian failure: Secondary | ICD-10-CM | POA: Insufficient documentation

## 2016-03-26 DIAGNOSIS — Z1231 Encounter for screening mammogram for malignant neoplasm of breast: Secondary | ICD-10-CM | POA: Insufficient documentation

## 2016-03-26 DIAGNOSIS — Z13820 Encounter for screening for osteoporosis: Secondary | ICD-10-CM | POA: Insufficient documentation

## 2016-03-26 NOTE — Assessment & Plan Note (Addendum)
Patient on metformin 1000mg  BID, Humalog mix 75/25 26 units BID though taking 30 units qAM and 20 units qPM, and Trulicity 0.75mg  weekly. Patient was previously on byetta but had to be switched to trulicity due to insurance preference. Last A1c was 10.5 in Oct 2017. Patient states that she changes her insulin regimen after feeling tired and dizzy for a few days about 2 weeks ago; since making the switch she has felt a lot better. She checks her CBG's about every 2 days and states they are a little bit higher (200s) since switching. She endorses bilateral LE numbness and tingling in her feet. She has been replacing her usual sodas and other drinks with water and has felt an overall benefit in how she feels. Her activity is low; she is considering increasing activity once weather is nicer.   Plan: --check A1c - 10.8 --continue metformin 1000mg  BID --continue novolog mix 75/25 30 units qAM and 20 units qPM --increase trulicity to 1.5mg  weekly --add empagloflozin as it was shown to be beneficial in pts with comorbid CAD --foot exam performed today --pt follows up with ophtho in April --start gabapentin for neuropathy - educated on self taper from 300mg  qhs to TID --f/u in 3 months; advised patient to check CBGs 2-3 times a day and if feeling dizzy/nauseated/weak

## 2016-03-26 NOTE — Assessment & Plan Note (Signed)
Patient was seen in Nov 2017 for pain in her right great toe; she had a small lesion that has been treated with topical Neosporin and was not thought to be infectious. Since then, she states her pain is improved but still has some tenderness; she endorses the nail falling off (though she did help pull it off prematurely) last week. She endorses stable redness, no wound, no fevers or chills or swelling.  Exam shows granulation tissue at nail bed and mild surrounding erythema and tenderness without swelling or drainage. Pulses 1+ bil DP and PT. Likely cause was diabetic microvascular changes as she also reports neuropathy in bil feet. She may have had trauma without knowing that precipitated this.  Plan: --referral to podiatry

## 2016-03-26 NOTE — Assessment & Plan Note (Signed)
Patient is due for her mammogram - she denies pain, tenderness, masses, skin changes, or nipple discharge. She is due for a Dexa scan. She is due for a pap smear - last one in 2007 was normal; she denies uterine bleeding, discomfort.  Plan: --re-order mammogram and order dexa scan; may be able to get them on same day --advised patient to return her stool cards --patient defers pap at this time and states will set up separate appt

## 2016-03-26 NOTE — Assessment & Plan Note (Signed)
Patient with multi-year history of right shoulder pain with movement. She was given short course of meloxicam  In Nov 2017 which she states did not help relieve the pain. She states that heat does help as well as an OTC cream. The pain has been stable.  Differentials include osteoarthritis, impingement, rotator cuff tear.  Plan: --encouraged use of heat and OTC cream as these improve her symptoms --XR right shoulder

## 2016-03-26 NOTE — Assessment & Plan Note (Signed)
Patient with previous myalgias on pravastatin 40mg  daily (though did tolerate lower doses), rosuvastatin and atorvastatin. She declines restarting therapy.  Plan: --continue addressing at future visits; can start with pravastatin 10mg  daily if willing; she is high risk of CV events and would benefit from statin therapy even if not at optimum dosage or intensity.

## 2016-03-26 NOTE — Assessment & Plan Note (Signed)
Order placed for screening mammography bil

## 2016-03-26 NOTE — Assessment & Plan Note (Signed)
Patient on lisinopril-HCTZ 40-25mg  daily and metoprolol 100mg  daily. She states she feels her BP has been well controlled; she denies chest pain. She states since adjusting her insulin she has had less shortness of breath with exertion and less dizziness. She denies headaches, vision or hearing changes.  BP controlled today.  Plan: --continue current regimen

## 2016-03-26 NOTE — Assessment & Plan Note (Signed)
Order placed for Dexa scan.

## 2016-03-26 NOTE — Assessment & Plan Note (Signed)
Patient was started on protonix for acid reflux symptoms in Oct. She states that her symptoms are well controlled and that she has figured out which food are triggers and is avoiding them. She uses the omeprazole PRN. She denies abd pain, nausea, vomiting, melena.  Plan: --continue protonix 20mg  daily PRN

## 2016-03-30 NOTE — Progress Notes (Signed)
Internal Medicine Clinic Attending  Case discussed with Dr. Svalina  at the time of the visit.  We reviewed the resident's history and exam and pertinent patient test results.  I agree with the assessment, diagnosis, and plan of care documented in the resident's note.  

## 2016-03-31 ENCOUNTER — Encounter: Payer: Self-pay | Admitting: Internal Medicine

## 2016-04-01 ENCOUNTER — Other Ambulatory Visit: Payer: Self-pay | Admitting: Internal Medicine

## 2016-04-01 DIAGNOSIS — Z1231 Encounter for screening mammogram for malignant neoplasm of breast: Secondary | ICD-10-CM

## 2016-04-06 ENCOUNTER — Other Ambulatory Visit: Payer: Self-pay | Admitting: Internal Medicine

## 2016-04-06 ENCOUNTER — Encounter: Payer: Self-pay | Admitting: *Deleted

## 2016-04-06 DIAGNOSIS — I10 Essential (primary) hypertension: Secondary | ICD-10-CM

## 2016-04-16 ENCOUNTER — Telehealth: Payer: Self-pay | Admitting: *Deleted

## 2016-04-16 NOTE — Telephone Encounter (Signed)
Geriatric Task Force f/u call - no answer; left message to give me a call back.

## 2016-04-22 ENCOUNTER — Ambulatory Visit: Payer: Medicare Other

## 2016-04-27 ENCOUNTER — Ambulatory Visit: Payer: Medicare Other

## 2016-04-27 ENCOUNTER — Ambulatory Visit: Payer: Medicare Other | Admitting: Podiatry

## 2016-04-30 DIAGNOSIS — E113592 Type 2 diabetes mellitus with proliferative diabetic retinopathy without macular edema, left eye: Secondary | ICD-10-CM | POA: Diagnosis not present

## 2016-04-30 DIAGNOSIS — H211X1 Other vascular disorders of iris and ciliary body, right eye: Secondary | ICD-10-CM | POA: Diagnosis not present

## 2016-04-30 DIAGNOSIS — H35372 Puckering of macula, left eye: Secondary | ICD-10-CM | POA: Diagnosis not present

## 2016-04-30 DIAGNOSIS — H211X2 Other vascular disorders of iris and ciliary body, left eye: Secondary | ICD-10-CM | POA: Diagnosis not present

## 2016-04-30 DIAGNOSIS — E113511 Type 2 diabetes mellitus with proliferative diabetic retinopathy with macular edema, right eye: Secondary | ICD-10-CM | POA: Diagnosis not present

## 2016-04-30 DIAGNOSIS — H43822 Vitreomacular adhesion, left eye: Secondary | ICD-10-CM | POA: Diagnosis not present

## 2016-04-30 NOTE — Progress Notes (Addendum)
Destiny Russell is a 66 y.o. female who was contacted on behalf of [facility] Geriatrics Task Force.   Diabetes Assessment  DM meds and BS checks -  "What medications are you taking for diabetes?" Trulicity, Jardiance, Humalog, metformin -  "How often are you checking your blood sugars at home?" has not been doing well lately keeping up with BG due to mother recently passing away  High Blood Pressure Assessment  BP meds & BP checks -  "What medications are you taking for high blood pressure?" metoprolol and lisinopril-HCTZ - "How often are you checking your blood pressure at home?" does not check  Coping with DM and BP - What else are you doing to help yourself with your DM and BP - has been trying to watch diet but coping with loss of her mother  Medication Access Issues  Medication Issues? -  "Are you getting your medicines refilled on time without skipping any doses?" patient reports adherence, this matches recent pharmacy records - "Are you having any problems getting/taking your medicines? - cost, timing, transportation?" no  Conclusion  Close the call - Confirmed importance and date/time of follow-up visits scheduled 05/07/16 - "Any other questions or concerns?" no

## 2016-05-05 ENCOUNTER — Ambulatory Visit: Payer: Medicare Other

## 2016-05-06 ENCOUNTER — Telehealth: Payer: Self-pay | Admitting: Internal Medicine

## 2016-05-06 NOTE — Telephone Encounter (Signed)
APT. REMINDER CALL, NO ANSWER, NO VOICEMAIL °

## 2016-05-07 ENCOUNTER — Ambulatory Visit (INDEPENDENT_AMBULATORY_CARE_PROVIDER_SITE_OTHER): Payer: Medicare Other | Admitting: Internal Medicine

## 2016-05-07 ENCOUNTER — Ambulatory Visit (INDEPENDENT_AMBULATORY_CARE_PROVIDER_SITE_OTHER): Payer: Medicare Other | Admitting: Podiatry

## 2016-05-07 VITALS — BP 104/57 | HR 81 | Temp 97.9°F | Wt 187.0 lb

## 2016-05-07 DIAGNOSIS — I10 Essential (primary) hypertension: Secondary | ICD-10-CM

## 2016-05-07 DIAGNOSIS — F17210 Nicotine dependence, cigarettes, uncomplicated: Secondary | ICD-10-CM | POA: Diagnosis not present

## 2016-05-07 DIAGNOSIS — L03031 Cellulitis of right toe: Secondary | ICD-10-CM | POA: Diagnosis not present

## 2016-05-07 DIAGNOSIS — R42 Dizziness and giddiness: Secondary | ICD-10-CM | POA: Diagnosis not present

## 2016-05-07 MED ORDER — MUPIROCIN 2 % EX OINT: 1.0000 "application " | TOPICAL_OINTMENT | Freq: Two times a day (BID) | CUTANEOUS | 2 refills | Status: DC

## 2016-05-07 MED ORDER — CEPHALEXIN 500 MG PO CAPS: 500.0000 mg | ORAL_CAPSULE | Freq: Three times a day (TID) | ORAL | 2 refills | Status: DC

## 2016-05-07 MED ORDER — LISINOPRIL 20 MG PO TABS: 20.0000 mg | ORAL_TABLET | Freq: Every day | ORAL | 2 refills | Status: DC

## 2016-05-07 NOTE — Progress Notes (Signed)
   CC: dizziness  HPI:  [name] is a 66 y.o.   Past Medical History:  Diagnosis Date  . Coronary atherosclerosis 04/13/2008   cardiac catheterization in 2001 (due to dyspnea on exertion) that showed small vessel coronary disease; Myoview 2010 probable soft tissue attenuation but overall normal perfusion and normal LV function. An echocardiogram 2004 revealed normal LV function, mild left atrial enlargement and mild mitral regurgitation. Medical management.  Marland Kitchen. GERD (gastroesophageal reflux disease)   . History of cataract surgery 03/01/2015  . Hyperlipidemia 04/13/2008   Declines statins - myalgias  . Hypertension   . Nail avulsion of toe 11/26/2015   right great toe - nontraumatic  . Parkinson's disease (HCC)    questionable; patient endorses diagnosis; no symptoms  . Type 2 diabetes mellitus with proliferative retinopathy without macular edema (HCC) 04/13/2008   [facility]: Severe proliferative retinopathy with clinically significant macular edema, stable vitreous hemorrhage of left eye, and stable age-related nuclear cataract of both eyes.  08/09/14: [facility]: Bilateral rapidly progressive diabetic retinopathy with no macular edema    Has been feeling dizzy for few months, mainly after getting up from sitting, which forces her to use a cane. It gets better after walking, also when she tries to look up she feels dizzy, no vertigo. Sitting down for few mins resolves the dizziness. Has been eating and drinking fine. No chest pain, sob, palpitations, headache, change of vision, numbness, tingling, weakness.  BP has been low, self reduced BP med from lisinopril hctz 20-12.5mg  BID to daily. Also on metoprolol. orthostats negative today.   Review of Systems:   Review of Systems  Constitutional: Negative for chills and fever.  Cardiovascular: Negative for chest pain.  Neurological: Positive for dizziness. Negative for tingling, tremors and  headaches.     Physical Exam:  Vitals:   05/07/16 1400  BP: (!) 104/57  Pulse: 81  Temp: 97.9 F (36.6 C)  TempSrc: Oral  SpO2: 98%  Weight: 187 lb (84.8 kg)   Physical Exam  Constitutional: She is oriented to person, place, and time. She appears well-developed and well-nourished. No distress.  HENT:  Head: Atraumatic.  Eyes: Conjunctivae and EOM are normal. Pupils are equal, round, and reactive to light. Right eye exhibits no discharge. Left eye exhibits no discharge.  Neck: Normal range of motion. No JVD present.  Cardiovascular: Normal rate.  Exam reveals no gallop and no friction rub.   No murmur heard. Respiratory: Effort normal and breath sounds normal. No respiratory distress. She has no wheezes.  Musculoskeletal: Normal range of motion. She exhibits no edema.  Neurological: She is alert and oriented to person, place, and time. No cranial nerve deficit.  Skin: She is not diaphoretic.  Psychiatric: She has a normal mood and affect.    Assessment & Plan:   See Encounters Tab for problem based charting.  Patient discussed with Dr. Heide SparkNarendra

## 2016-05-07 NOTE — Assessment & Plan Note (Signed)
Dizziness is of  unclear etiology, could be orthostatic although orthostatic vital is negative today, her BP is low.   Neuro exam is normal. Low suspicion for posterior circle stroke or vertebral insufficiency.   Will reduce BP med Will send to vestibular therapy. If not improving, will get MRI brain.

## 2016-05-07 NOTE — Assessment & Plan Note (Signed)
Vitals:   05/07/16 1400  BP: (!) 104/57  Pulse: 81  Temp: 97.9 F (36.6 C)   BP is on low side. D/c lisinopril-hctz 20-12.5mg  bid.  Put her on just lisinopril 20mg  daily. Cont metoprolol. f/up in 1 month.

## 2016-05-07 NOTE — Progress Notes (Signed)
   Subjective:    Patient ID: Destiny Russell, female    DOB: [birth year], 66 y.o.   MRN: [id number]  HPI   She states she'll female presents the office today for concerns of her right big toenail coming off and his been red over the last 2 months and painful. She states the areas painful pressure in shoes. She gets some occasional drainage/posterior from the area but not recently. Denies any red streaks. She said no recent treatment for this. She is diabetic and her last A1c was 10.8. She's not currently on any antibiotics. She has no other complaints today.  Review of Systems  Constitutional: Positive for activity change, appetite change and fatigue.  HENT: Positive for sinus pain, sinus pressure and sneezing.   Eyes: Positive for redness, itching and visual disturbance.  Respiratory: Positive for shortness of breath.   Gastrointestinal: Positive for constipation, diarrhea, nausea and vomiting.  Genitourinary: Positive for urgency.  Neurological: Positive for dizziness, weakness, light-headedness and headaches.  All other systems reviewed and are negative.      Objective:   Physical Exam General: AAO x3, NAD  Dermatological: Minimal nails present along the lateral proximal portion of the right hallux toenail. There is erythema on the proximal nail border without any ascending synovitis. There is a fibrotic wound present within the nail bed. There is no drainage or pus expressed today. No warmth. No fluctuance or crepitus. No malodor.  Vascular: Dorsalis Pedis artery and Posterior Tibial artery pedal pulses are decreased on the right compared to left. There is no pain with calf compression, swelling, warmth, erythema.   Neruologic: Sensation decreased with Sims once monofilament  Musculoskeletal: No gross boney pedal deformities bilateral. No pain, crepitus, or limitation noted with foot and ankle range of motion bilateral. Muscular strength 5/5 in all groups tested bilateral.  Gait:  Unassisted, Nonantalgic.     Assessment & Plan:  66 year old female with right hallux cellulitis, traumatic nail avulsion, decreased pulses, uncontrolled diabetes -Treatment options discussed including all alternatives, risks, and complications -Etiology of symptoms were discussed -I was able to remove the piece the nail but any complications. -Prescribed Keflex to cellulitis. -Mupirocin ointment daily to the wound. -Will order arterial studies. -Blood sugar control  -Monitor for any clinical signs or symptoms of infection and directed to call the office immediately should any occur or go to the ER.  Ovid CurdMatthew Wagoner, DPM

## 2016-05-07 NOTE — Progress Notes (Signed)
Internal Medicine Clinic Attending  Case discussed with Dr. Ahmed at the time of the visit.  We reviewed the resident's history and exam and pertinent patient test results.  I agree with the assessment, diagnosis, and plan of care documented in the resident's note. 

## 2016-05-07 NOTE — Patient Instructions (Signed)
Please stop lisinopril  Start taking Lisinopril 20mg  just once daily.  Will refer you to Physical therapy.  Follow up with your Primary doctor in 1 month.

## 2016-05-08 ENCOUNTER — Telehealth: Payer: Self-pay | Admitting: *Deleted

## 2016-05-08 DIAGNOSIS — R0989 Other specified symptoms and signs involving the circulatory and respiratory systems: Secondary | ICD-10-CM

## 2016-05-08 NOTE — Telephone Encounter (Addendum)
-----   Message from Vivi BarrackMatthew R Wagoner, DPM sent at 05/08/2016 11:30 AM EDT ----- Can you plese order arterial studies for nonhealing ulcer and decreased pulses? Orders to [facility].

## 2016-05-15 ENCOUNTER — Ambulatory Visit: Payer: Medicare Other

## 2016-05-18 ENCOUNTER — Ambulatory Visit: Payer: Medicare Other | Attending: Oncology | Admitting: Physical Therapy

## 2016-05-18 VITALS — BP 138/92

## 2016-05-18 DIAGNOSIS — R42 Dizziness and giddiness: Secondary | ICD-10-CM

## 2016-05-18 DIAGNOSIS — R2681 Unsteadiness on feet: Secondary | ICD-10-CM | POA: Diagnosis not present

## 2016-05-18 DIAGNOSIS — R2689 Other abnormalities of gait and mobility: Secondary | ICD-10-CM

## 2016-05-20 NOTE — Therapy (Signed)
[facility] [address] [place], [place], [address] Phone: [phone]   Fax:  [phone]  Physical Therapy Evaluation  Patient Details  Name: Destiny Russell MRN: [id number] Date of Birth: 1950/07/25 Referring Provider: Dr. Hyacinth Meekerasrif Ahmed;  Cc:  Dr. Cephas DarbyJames Granfortuna   Encounter Date: 05/18/2016      PT End of Session - 05/20/16 1313    Number of Visits 9   Date for PT Re-Evaluation 06/18/16   Authorization Type Medicare   Authorization Time Period 05-18-16 - 07-17-16   PT Start Time 1140   PT Stop Time 1230   PT Time Calculation (min) 50 min      Past Medical History:  Diagnosis Date  . Coronary atherosclerosis 04/13/2008   cardiac catheterization in 2001 (due to dyspnea on exertion) that showed small vessel coronary disease; Myoview 2010 probable soft tissue attenuation but overall normal perfusion and normal LV function. An echocardiogram 2004 revealed normal LV function, mild left atrial enlargement and mild mitral regurgitation. Medical management.  Marland Kitchen. GERD (gastroesophageal reflux disease)   . History of cataract surgery 03/01/2015  . Hyperlipidemia 04/13/2008   Declines statins - myalgias  . Hypertension   . Nail avulsion of toe 11/26/2015   right great toe - nontraumatic  . Parkinson's disease (HCC)    questionable; patient endorses diagnosis; no symptoms  . Type 2 diabetes mellitus with proliferative retinopathy without macular edema (HCC) 04/13/2008   [facility]06/14 [facility]: Severe proliferative retinopathy with clinically significant macular edema, stable vitreous hemorrhage of left eye, and stable age-related nuclear cataract of both eyes.  08/09/14: [facility]: Bilateral rapidly progressive diabetic retinopathy with no macular edema     Past Surgical History:  Procedure Laterality Date  . CARDIAC CATHETERIZATION    . cataract    . CESAREAN SECTION      Vitals:   05/18/16 1206  BP: (!) 138/92         Subjective Assessment - 05/20/16 1301    Subjective Pt states she has been walking slower for past year and started getting dizzy when looking up approx. 6 months ago; has been using cane for about 6 months; uses cane in community, not in the home   Patient is accompained by: Family member   Pertinent History IDDM; HTN; coronary atherosclerosis; Parkinson's disease (questionable)   Patient Stated Goals "get back to walking like I was, get rid of the cane and get rid of vertigo"   Currently in Pain? No/denies            [facility] - 05/20/16 0001      Assessment   Medical Diagnosis Vertigo   Referring Provider Dr. Hyacinth Meekerasrif Ahmed   Prior Therapy none     Balance Screen   Has the patient fallen in the past 6 months No   Has the patient had a decrease in activity level because of a fear of falling?  No   Is the patient reluctant to leave their home because of a fear of falling?  No     Home Environment   Living Environment Private residence   Type of Home House   Home Access Stairs to enter   Entrance Stairs-Number of Steps 5   Entrance Stairs-Rails Right;Left;Can reach both   Home Layout One level     Prior Function   Level of Independence Independent     Observation/Other Assessments   Focus on Therapeutic Outcomes (FOTO)  45/100 with risk adjusted 47/100;  DHI 82%     Ambulation/Gait   Ambulation/Gait Yes   Ambulation/Gait Assistance 6: Modified independent (Device/Increase time)   Ambulation Distance (Feet) 100 Feet   Assistive device 4-wheeled walker   Gait Pattern Step-through pattern   Ambulation Surface Level;Indoor            Vestibular Assessment - 05/20/16 0001      Vestibular Assessment   General Observation Pt is a 66 yr old lady with intermittent c/o vertigo experienced when she looks up or when she is walking in a big store and looking for items on shelf     Symptom Behavior   Type of Dizziness  "Funny feeling in head"   Frequency of Dizziness varies   Duration of Dizziness secs to minutes   Aggravating Factors Looking up to the ceiling   Relieving Factors Rest;Head stationary     Occulomotor Exam   Occulomotor Alignment Normal   Spontaneous Absent     Positional Testing   Dix-Hallpike Dix-Hallpike Right;Dix-Hallpike Left   Sidelying Test Sidelying Right;Sidelying Left     Dix-Hallpike Right   Dix-Hallpike Right Duration none   Dix-Hallpike Right Symptoms No nystagmus     Dix-Hallpike Left   Dix-Hallpike Left Duration none   Dix-Hallpike Left Symptoms No nystagmus     Sidelying Right   Sidelying Right Duration none   Sidelying Right Symptoms No nystagmus     Sidelying Left   Sidelying Left Duration none   Sidelying Left Symptoms No nystagmus         Self care:  Educated pt and pt's son in signs and symptoms of BPPV; pt's vertigo unable to be provoked during initial PT eval - Appears to be multi-factorial in etiology              PT Education - 05/20/16 1309    Education provided Yes   Education Details instructed pt to take notes when episode of vertigo occurred - unable to provoke during initial PT eval    Person(s) Educated Child(ren)  son   Methods Explanation   Comprehension Verbalized understanding             PT Long Term Goals - 05/20/16 1323      PT LONG TERM GOAL #1   Title Pt will improve TUG score from 16.19 secs to </= 13.5 secs with RW to reduce fall risk.  06-18-16   Time 4   Period Weeks   Status New     PT LONG TERM GOAL #2   Title Pt will improve gait velocity from 2.24 ft/sec to >/= 2.7 ft/sec with RW for incr. gait efficiency.  06-18-16   Baseline 14.65 secs = 2.24 ft/sec   Time 4   Period Weeks   Status New     PT LONG TERM GOAL #3   Title Independent in HEP for balance and vestibular exercises.  06-18-16   Time 4   Period Weeks   Status New     PT LONG TERM GOAL #4   Title Increase FOTO from 45/100 to  50/100 and increase DHI score from 82% to </= 60% to demo improvement in vertigo.  06-18-16   Baseline 45/100 with risk adjusted 47/100:  DHI 82%   Time 4   Period Weeks   Status New               Plan - 05/20/16 1315    Clinical Impression Statement Pt is a  66 year old lady with c/o intermittent vertigo which she states has been going on for approximately a year but seems to be getting progressively worse within past 6 months.  Vertigo was unable to be provoked in clinic during initial eval and appears to be multi-factorial in etiology.  PMH includes Type 2 DM, HTN, coronary atherosclerosis, and questionable Parkinson's disease.  Moderate complextiy eval with pt presenting with c/o vertigo , gait and balance deficits with dependency with amb. with pt using rollator for safety.   Rehab Potential Good   PT Frequency 2x / week   PT Duration 4 weeks   PT Treatment/Interventions ADLs/Self Care Home Management;Functional mobility training;Stair training;Gait training;Therapeutic activities;Therapeutic exercise;Balance training;Neuromuscular re-education;Patient/family education;Vestibular   PT Next Visit Plan reassess vertigo if pt reports having had episode:  do Berg test; give balance exercises for HEP   Consulted and Agree with Plan of Care Patient;Family member/caregiver   Family Member Consulted son      Patient will benefit from skilled therapeutic intervention in order to improve the following deficits and impairments:  Dizziness, Decreased balance, Decreased strength, Decreased mobility, Decreased activity tolerance, Difficulty walking  Visit Diagnosis: Dizziness and giddiness - Plan: PT plan of care cert/re-cert  Other abnormalities of gait and mobility - Plan: PT plan of care cert/re-cert  Unsteadiness on feet - Plan: PT plan of care cert/re-cert     Problem List Patient Active Problem List   Diagnosis Date Noted  . Dizziness 05/07/2016  . Nail avulsion of toe  11/26/2015  . Right shoulder pain 06/01/2015  . GERD (gastroesophageal reflux disease) 10/28/2013  . Healthcare maintenance 09/07/2012  . Hyperlipidemia 04/13/2008  . Essential hypertension 04/13/2008  . Coronary atherosclerosis 04/13/2008  . Type 2 diabetes mellitus with proliferative retinopathy without macular edema (HCC) 04/13/2008    Ardelle Haliburton, Donavan Burnet, PT 05/20/2016, 1:37 PM  [facility] [address] [place], [place], [address] Phone: [phone]   Fax:  [phone]  Name: Destiny Russell MRN: [id number] Date of Birth: 01/29/1950

## 2016-05-21 ENCOUNTER — Encounter: Payer: Self-pay | Admitting: Podiatry

## 2016-05-21 ENCOUNTER — Ambulatory Visit (INDEPENDENT_AMBULATORY_CARE_PROVIDER_SITE_OTHER): Payer: Medicare Other

## 2016-05-21 ENCOUNTER — Ambulatory Visit (INDEPENDENT_AMBULATORY_CARE_PROVIDER_SITE_OTHER): Payer: Medicare Other | Admitting: Podiatry

## 2016-05-21 DIAGNOSIS — M79674 Pain in right toe(s): Secondary | ICD-10-CM

## 2016-05-24 NOTE — Progress Notes (Signed)
Subjective: 66 year old female presents the office today for follow-up evaluation of right big toe pain. She said the nail did come off and was having some redness. Remove a small piece of nail last time the nail border. She states the areas still is somewhat tender but the redness has improved and she gets some occasional redness at times. She is also with a course of antibiotics. She denies a drainage or pus. Areas painful shoe gear. Denies any systemic complaints such as fevers, chills, nausea, vomiting. No acute changes since last appointment, and no other complaints at this time.   Objective: AAO x3, NAD DP/PT pulses decreased bilaterally, CRT less than 3 seconds Along the central aspect of the nail down the right hallux with small granular wound present. There is mild tenderness around this area. There is no significant erythema or edema there is no ascending cellulitis. Overall dissection appears to be improved in appearance compared to last appointment. There is no clinical signs of infection noted today. On the left hallux along the medial aspect is mild incurvation of tenderness there is no signs of infection. Is no edema, erythema, drainage or pus. No open lesions or pre-ulcerative lesions.  No pain with calf compression, swelling, warmth, erythema  Assessment: Right hallux nailbed status post traumatic avulsion which is healing and there is no signs of infection today; ingrown toenail left hallux  Plan: -All treatment options discussed with the patient including all alternatives, risks, complications.  -I sharply debrided the medial aspect of the left hallux toenail any complications or bleeding. -Regards to the right hallux is actually appears be improvement infection appears resolved. However I do recommend small amount and buttock ointment dressing changes daily to the wound digitally the area uncovered at night. She has not yet had arterial studies. Given this delayed healing to the  wound is still recommend her to get these. These have been ordered which she is to call to schedule. If she is on her the next couple days as scheduled to call the office for follow-up with us. -RTC 3 weeks or sooner if needed.  -Patient encouraged to call the office with any questions, concerns, change in symptoms.

## 2016-05-25 ENCOUNTER — Ambulatory Visit: Payer: Medicare Other | Admitting: Physical Therapy

## 2016-05-25 DIAGNOSIS — Z961 Presence of intraocular lens: Secondary | ICD-10-CM | POA: Diagnosis not present

## 2016-05-25 DIAGNOSIS — R2689 Other abnormalities of gait and mobility: Secondary | ICD-10-CM

## 2016-05-25 DIAGNOSIS — R2681 Unsteadiness on feet: Secondary | ICD-10-CM

## 2016-05-25 DIAGNOSIS — R42 Dizziness and giddiness: Secondary | ICD-10-CM | POA: Diagnosis not present

## 2016-05-25 DIAGNOSIS — H10413 Chronic giant papillary conjunctivitis, bilateral: Secondary | ICD-10-CM | POA: Diagnosis not present

## 2016-05-25 DIAGNOSIS — H04123 Dry eye syndrome of bilateral lacrimal glands: Secondary | ICD-10-CM | POA: Diagnosis not present

## 2016-05-25 DIAGNOSIS — E113513 Type 2 diabetes mellitus with proliferative diabetic retinopathy with macular edema, bilateral: Secondary | ICD-10-CM | POA: Diagnosis not present

## 2016-05-25 NOTE — Patient Instructions (Signed)
Standing Marching   Using a chair if necessary, march in place. Repeat 10 times. Do 2 sessions per day.  http://gt2.exer.us/344   Copyright  VHI. All rights reserved.   Hip Backward Kick   Using a chair for balance, keep legs shoulder width apart and toes pointed for- ward. Slowly extend one leg back, keeping knee straight. Do not lean forward. Repeat with other leg. Repeat 10 times each leg.  Do 1-2sessions per day.  http://gt2.exer.us/340   Copyright  VHI. All rights reserved.     Hip Side Kick   Holding a chair for balance, keep legs shoulder width apart and toes pointed forward. Swing a leg out to side, keeping knee straight. Do not lean. Repeat using other leg. Repeat 10  Times each leg.  Do 1-2 sessions per day.  ALSO DO FORWARD KICKS -10 reps each leg - holding onto chair as needed        Standing On One Leg Without Support .  Stand on one leg in neutral spine without support. Hold 10 seconds. Repeat on other leg. Do 2 repetitions, 1-2 sets.  http://bt.exer.us/36   Copyright  VHI. All rights reserved.    Side-Stepping   Walk to left side with eyes open. Take even steps, leading with same foot. Make sure each foot lifts off the floor. Repeat in opposite direction. Repeat for 1 minutes per session. Do 1-2 sessions per day.   Copyright  VHI. All rights reserved.         Braiding   Move to side: 1) cross right leg in front of left, 2) bring back leg out to side, then 3) cross right leg behind left, 4) bring left leg out to side. Continue sequence in same direction. Reverse sequence, moving in opposite direction. Repeat sequence 3 times per session. Do 1-2 sessions per day.   Copyright  VHI. All rights reserved.

## 2016-05-26 ENCOUNTER — Ambulatory Visit (HOSPITAL_COMMUNITY)
Admission: RE | Admit: 2016-05-26 | Discharge: 2016-05-26 | Disposition: A | Discharge status: Home / Self Care | Payer: Medicare Other | Source: Ambulatory Visit | Attending: Cardiology | Admitting: Cardiology

## 2016-05-26 DIAGNOSIS — R0989 Other specified symptoms and signs involving the circulatory and respiratory systems: Secondary | ICD-10-CM | POA: Diagnosis not present

## 2016-05-26 DIAGNOSIS — I739 Peripheral vascular disease, unspecified: Secondary | ICD-10-CM | POA: Diagnosis not present

## 2016-05-26 NOTE — Therapy (Signed)
[facility] [address] [place], [place], [address] Phone: [phone]   Fax:  [phone]  Physical Therapy Treatment  Patient Details  Name: Destiny Russell MRN: [id number] Date of Birth: 05/14/1950 Referring Provider: Dr. Hyacinth Meeker  Encounter Date: 05/25/2016      PT End of Session - 05/26/16 2126    Visit Number 2   Number of Visits 9   Date for PT Re-Evaluation 06/18/16   Authorization Type Medicare   Authorization Time Period 05-18-16 - 07-17-16   PT Start Time 0939   PT Stop Time 1021   PT Time Calculation (min) 42 min      Past Medical History:  Diagnosis Date  . Coronary atherosclerosis 04/13/2008   cardiac catheterization in 2001 (due to dyspnea on exertion) that showed small vessel coronary disease; Myoview 2010 probable soft tissue attenuation but overall normal perfusion and normal LV function. An echocardiogram 2004 revealed normal LV function, mild left atrial enlargement and mild mitral regurgitation. Medical management.  Marland Kitchen GERD (gastroesophageal reflux disease)   . History of cataract surgery 03/01/2015  . Hyperlipidemia 04/13/2008   Declines statins - myalgias  . Hypertension   . Nail avulsion of toe 11/26/2015   right great toe - nontraumatic  . Parkinson's disease (HCC)    questionable; patient endorses diagnosis; no symptoms  . Type 2 diabetes mellitus with proliferative retinopathy without macular edema (HCC) 04/13/2008   [facility]: Severe proliferative retinopathy with clinically significant macular edema, stable vitreous hemorrhage of left eye, and stable age-related nuclear cataract of both eyes.  08/09/14: [facility]: Bilateral rapidly progressive diabetic retinopathy with no macular edema     Past Surgical History:  Procedure Laterality Date  . CARDIAC CATHETERIZATION    . cataract    . CESAREAN SECTION      There were no vitals filed for  this visit.      Subjective Assessment - 05/26/16 2119    Subjective Pt states she is feeling better - says it may have have been side effect of BP medication - says she has not been as tired as she was   Patient is accompained by: Family member   Pertinent History IDDM; HTN; coronary atherosclerosis; Parkinson's disease (questionable)   Patient Stated Goals "get back to walking like I was, get rid of the cane and get rid of vertigo"   Currently in Pain? No/denies           Pt instructed in standing balance exercises for HEP - see pt instructions for details  Pt performed sit to stand 10 reps without UE support   Scit Fit level 1.5 x 6"            [facility] Adult PT Treatment/Exercise - 05/26/16 0001      Transfers   Transfers Sit to Stand   Number of Reps 10 reps   Comments no UE support used     Ambulation/Gait   Ambulation/Gait Yes   Ambulation/Gait Assistance 6: Modified independent (Device/Increase time)   Ambulation Distance (Feet) 230 Feet   Assistive device 4-wheeled walker   Gait Pattern Step-through pattern   Ambulation Surface Level;Indoor     Standardized Balance Assessment   Standardized Balance Assessment Berg Balance Test     Berg Balance Test   Sit to Stand Able to stand without using hands and stabilize independently   Standing Unsupported Able to stand safely 2 minutes   Sitting  with Back Unsupported but Feet Supported on Floor or Stool Able to sit safely and securely 2 minutes   Stand to Sit Sits safely with minimal use of hands   Standing Unsupported with Eyes Closed Able to stand 10 seconds with supervision   Standing Ubsupported with Feet Together Able to place feet together independently and stand for 1 minute with supervision   From Standing Position, Pick up Object from Floor Able to pick up shoe safely and easily   From Standing Position, Turn to Look Behind Over each Shoulder Looks behind from both sides and weight shifts well   Turn  360 Degrees Able to turn 360 degrees safely but slowly  6.09, 6.10                     PT Long Term Goals - 05/20/16 1323      PT LONG TERM GOAL #1   Title Pt will improve TUG score from 16.19 secs to </= 13.5 secs with RW to reduce fall risk.  06-18-16   Time 4   Period Weeks   Status New     PT LONG TERM GOAL #2   Title Pt will improve gait velocity from 2.24 ft/sec to >/= 2.7 ft/sec with RW for incr. gait efficiency.  06-18-16   Baseline 14.65 secs = 2.24 ft/sec   Time 4   Period Weeks   Status New     PT LONG TERM GOAL #3   Title Independent in HEP for balance and vestibular exercises.  06-18-16   Time 4   Period Weeks   Status New     PT LONG TERM GOAL #4   Title Increase FOTO from 45/100 to 50/100 and increase DHI score from 82% to </= 60% to demo improvement in vertigo.  06-18-16   Baseline 45/100 with risk adjusted 47/100:  DHI 82%   Time 4   Period Weeks   Status New               Plan - 05/26/16 2126    Clinical Impression Statement Pt needs UE support with standing balance exercises for safety;  activity tolerance improved today compared to performance on initial eval - pt attributes this improvement to change in BP medication   Rehab Potential Good   PT Frequency 2x / week   PT Duration 4 weeks   PT Treatment/Interventions ADLs/Self Care Home Management;Functional mobility training;Stair training;Gait training;Therapeutic activities;Therapeutic exercise;Balance training;Neuromuscular re-education;Patient/family education;Vestibular   PT Next Visit Plan complete Berg; check HEP - cont balance   PT Home Exercise Plan balance   Consulted and Agree with Plan of Care Patient;Family member/caregiver   Family Member Consulted son      Patient will benefit from skilled therapeutic intervention in order to improve the following deficits and impairments:  Dizziness, Decreased balance, Decreased strength, Decreased mobility, Decreased activity  tolerance, Difficulty walking  Visit Diagnosis: Other abnormalities of gait and mobility  Unsteadiness on feet     Problem List Patient Active Problem List   Diagnosis Date Noted  . Dizziness 05/07/2016  . Nail avulsion of toe 11/26/2015  . Right shoulder pain 06/01/2015  . GERD (gastroesophageal reflux disease) 10/28/2013  . Healthcare maintenance 09/07/2012  . Hyperlipidemia 04/13/2008  . Essential hypertension 04/13/2008  . Coronary atherosclerosis 04/13/2008  . Type 2 diabetes mellitus with proliferative retinopathy without macular edema (HCC) 04/13/2008    Bryant Lipps, Donavan Burnet, PT 05/26/2016, 9:32 PM  [facility] [phone]  [address] [place], [place], [address] Phone: [phone]   Fax:  [phone]  Name: Destiny Russell MRN: [id number] Date of Birth: [birth year]

## 2016-05-28 ENCOUNTER — Telehealth: Payer: Self-pay | Admitting: *Deleted

## 2016-05-28 NOTE — Telephone Encounter (Signed)
-----   Message from Vivi BarrackMatthew R Wagoner, DPM sent at 05/27/2016  2:02 PM EDT ----- Abnormal- please schedule a consult with Dr. Allyson SabalBerry. Thanks

## 2016-06-02 ENCOUNTER — Ambulatory Visit: Payer: Medicare Other | Admitting: Physical Therapy

## 2016-06-02 DIAGNOSIS — R2689 Other abnormalities of gait and mobility: Secondary | ICD-10-CM

## 2016-06-02 DIAGNOSIS — R42 Dizziness and giddiness: Secondary | ICD-10-CM | POA: Diagnosis not present

## 2016-06-02 DIAGNOSIS — R2681 Unsteadiness on feet: Secondary | ICD-10-CM

## 2016-06-03 NOTE — Therapy (Signed)
[facility] [address] [place], [place], [address] Phone: [phone]   Fax:  [phone]  Physical Therapy Treatment  Patient Details  Name: Destiny Russell MRN: [id number] Date of Birth: Apr 07, 1950 Referring Provider: Dr. Hyacinth Meekerasrif Ahmed  Encounter Date: 06/02/2016      PT End of Session - 06/03/16 1616    Visit Number 3   Number of Visits 9   Date for PT Re-Evaluation 06/18/16   Authorization Type Medicare   Authorization Time Period 05-18-16 - 07-17-16   PT Start Time 1315   PT Stop Time 1400   PT Time Calculation (min) 45 min      Past Medical History:  Diagnosis Date  . Coronary atherosclerosis 04/13/2008   cardiac catheterization in 2001 (due to dyspnea on exertion) that showed small vessel coronary disease; Myoview 2010 probable soft tissue attenuation but overall normal perfusion and normal LV function. An echocardiogram 2004 revealed normal LV function, mild left atrial enlargement and mild mitral regurgitation. Medical management.  Marland Kitchen. GERD (gastroesophageal reflux disease)   . History of cataract surgery 03/01/2015  . Hyperlipidemia 04/13/2008   Declines statins - myalgias  . Hypertension   . Nail avulsion of toe 11/26/2015   right great toe - nontraumatic  . Parkinson's disease (HCC)    questionable; patient endorses diagnosis; no symptoms  . Type 2 diabetes mellitus with proliferative retinopathy without macular edema (HCC) 04/13/2008   [facility]06[facility]: Severe proliferative retinopathy with clinically significant macular edema, stable vitreous hemorrhage of left eye, and stable age-related nuclear cataract of both eyes.  08/09/14: [facility]: Bilateral rapidly progressive diabetic retinopathy with no macular edema     Past Surgical History:  Procedure Laterality Date  . CARDIAC CATHETERIZATION    . cataract    . CESAREAN SECTION      There were no vitals filed for  this visit.      Subjective Assessment - 06/03/16 1605    Subjective Pt states she has started walking at track near her home in addition to doing her HEP   Patient is accompained by: Family member  pt's son   Pertinent History IDDM; HTN; coronary atherosclerosis; Parkinson's disease (questionable)   Patient Stated Goals "get back to walking like I was, get rid of the cane and get rid of vertigo"   Currently in Pain? No/denies                         [facility] Adult PT Treatment/Exercise - 06/03/16 0001      Transfers   Transfers Sit to Stand   Number of Reps 1 set;10 reps   Comments no UE support used  on blue foam balance beam     Berg Balance Test   Sit to Stand Able to stand without using hands and stabilize independently   Standing Unsupported Able to stand safely 2 minutes   Sitting with Back Unsupported but Feet Supported on Floor or Stool Able to sit safely and securely 2 minutes   Stand to Sit Sits safely with minimal use of hands   Transfers Able to transfer safely, minor use of hands   Standing Unsupported with Eyes Closed Able to stand 10 seconds with supervision   Standing Ubsupported with Feet Together Able to place feet together independently and stand for 1 minute with supervision   From Standing, Reach Forward with Outstretched Arm Can reach confidently >25 cm (  10")   From Standing Position, Pick up Object from Floor Able to pick up shoe safely and easily   From Standing Position, Turn to Look Behind Over each Shoulder Looks behind from both sides and weight shifts well   Turn 360 Degrees Able to turn 360 degrees safely but slowly   Standing Unsupported, One Foot in Front Able to place foot tandem independently and hold 30 seconds   Standing on One Leg Able to lift leg independently and hold equal to or more than 3 seconds    Berg score 52/56          Balance Exercises - 06/03/16 1613      Balance Exercises: Standing   Tandem Stance Eyes  open   SLS Intermittent upper extremity support;1 rep;15 secs   Rockerboard Anterior/posterior;EO;30 seconds;Intermittent UE support   Sidestepping 3 reps   Other Standing Exercises standing on balance beam - horizontal head turns 5 reps to right and left sides:  stepping over and back of beam  10 reps each leg with min assist        TherEx:  Standing hip extension, abduction and flexion 10 reps each direction with red theraband - added these exs to HEP Heel raises x 10 reps bil. LE's Sci Fit level 1.5 x 5" with UE's and LE's Step ups 10 reps each leg 10 reps each with UE support prn Step down exercises 10 reps each leg with bil. UE support 10 reps each leg         PT Long Term Goals - 05/20/16 1323      PT LONG TERM GOAL #1   Title Pt will improve TUG score from 16.19 secs to </= 13.5 secs with RW to reduce fall risk.  06-18-16   Time 4   Period Weeks   Status New     PT LONG TERM GOAL #2   Title Pt will improve gait velocity from 2.24 ft/sec to >/= 2.7 ft/sec with RW for incr. gait efficiency.  06-18-16   Baseline 14.65 secs = 2.24 ft/sec   Time 4   Period Weeks   Status New     PT LONG TERM GOAL #3   Title Independent in HEP for balance and vestibular exercises.  06-18-16   Time 4   Period Weeks   Status New     PT LONG TERM GOAL #4   Title Increase FOTO from 45/100 to 50/100 and increase DHI score from 82% to </= 60% to demo improvement in vertigo.  06-18-16   Baseline 45/100 with risk adjusted 47/100:  DHI 82%   Time 4   Period Weeks   Status New               Plan - 06/03/16 1617    Clinical Impression Statement Pt demonstrates decreased high level balance skills with basic balance skills WNL's:  Berg score 52/56 - not indicative of fall risk:  pt progressing well towards goals   Rehab Potential Good   PT Frequency 2x / week   PT Duration 4 weeks   PT Treatment/Interventions ADLs/Self Care Home Management;Functional mobility training;Stair training;Gait  training;Therapeutic activities;Therapeutic exercise;Balance training;Neuromuscular re-education;Patient/family education;Vestibular   PT Next Visit Plan cont high level balance and gait   PT Home Exercise Plan balance   Consulted and Agree with Plan of Care Patient;Family member/caregiver   Family Member Consulted son      Patient will benefit from skilled therapeutic intervention in order to improve the following  deficits and impairments:  Dizziness, Decreased balance, Decreased strength, Decreased mobility, Decreased activity tolerance, Difficulty walking  Visit Diagnosis: Other abnormalities of gait and mobility  Unsteadiness on feet     Problem List Patient Active Problem List   Diagnosis Date Noted  . Dizziness 05/07/2016  . Nail avulsion of toe 11/26/2015  . Right shoulder pain 06/01/2015  . GERD (gastroesophageal reflux disease) 10/28/2013  . Healthcare maintenance 09/07/2012  . Hyperlipidemia 04/13/2008  . Essential hypertension 04/13/2008  . Coronary atherosclerosis 04/13/2008  . Type 2 diabetes mellitus with proliferative retinopathy without macular edema (HCC) 04/13/2008    Avalon Coppinger, Donavan Burnet, PT 06/03/2016, 4:23 PM  [facility] [address] [place], [place], [address] Phone: [phone]   Fax:  [phone]  Name: Destiny Russell MRN: [id number] Date of Birth: Feb 20, 1950

## 2016-06-04 ENCOUNTER — Encounter: Payer: Self-pay | Admitting: Cardiovascular Disease

## 2016-06-04 ENCOUNTER — Ambulatory Visit (INDEPENDENT_AMBULATORY_CARE_PROVIDER_SITE_OTHER): Payer: Medicare Other | Admitting: Cardiovascular Disease

## 2016-06-04 DIAGNOSIS — E1142 Type 2 diabetes mellitus with diabetic polyneuropathy: Secondary | ICD-10-CM | POA: Insufficient documentation

## 2016-06-04 DIAGNOSIS — R0609 Other forms of dyspnea: Secondary | ICD-10-CM | POA: Diagnosis not present

## 2016-06-04 DIAGNOSIS — I10 Essential (primary) hypertension: Secondary | ICD-10-CM

## 2016-06-04 NOTE — Assessment & Plan Note (Signed)
Mr. Destiny Russell was referred by Dr. Loreta AveWagner because of numbness in her feet and a painful right great toe. I suspect her right great toe is related to gout. I suspect that her numbness is related to diabetic peripheral neuropathy. Her pulses are palpable and her ABIs are normal. I'm going to get larger arterial Doppler studies I will see her back when necessary.

## 2016-06-04 NOTE — Progress Notes (Signed)
06/04/2016 Destiny Russell   12-Oct-1950  [id number]  Primary Physician Destiny Grieve, MD Primary Cardiologist: Destiny Harp MD Destiny Russell  HPI:  Ms. Destiny Russell is a 66 year old thin appearing married Caucasian female mother of 28, grandmother of 3 grandchildren referred by Dr. Earleen Russell, her podiatrist, for peripheral vascular evaluation because of numbness in her feet. She also has a painful right great toe. She is a retired Oncologist. She is never smoked. She does have history of hypertension and diabetes. She is never had a heart attack or stroke. She does complain of shortness of breath. She has had a heart catheterization back in 2001 and was found to have "small vessel disease". She complains of numbness in her feet but denies claudication. Dopplers performed 05/24/16 revealed normal ABIs although the left ABI could not be accurately determined because of incompressibility.   Current Outpatient Prescriptions  Medication Sig Dispense Refill  . ACCU-CHEK FASTCLIX LANCETS MISC USE TO CHECK BLOOD SUGAR 3 TO 4 TIMES DAILY. Diagnosis code E11.3599. Insulin dependent 102 each 5  . ACCU-CHEK SMARTVIEW test strip USE TO CHECK BLOOD SUGAR 3 TIMES DAILY 100 each 5  . aspirin EC 81 MG tablet Take 81 mg by mouth daily.    . Blood Glucose Monitoring Suppl (ACCU-CHEK NANO SMARTVIEW) W/DEVICE KIT Use to test blood glucose 3 times daily. Dx:250.00 1 kit 0  . cetirizine (ZYRTEC) 10 MG tablet TAKE ONE TABLET BY MOUTH ONCE DAILY 30 tablet 3  . empagliflozin (JARDIANCE) 10 MG TABS tablet Take 10 mg by mouth daily. 90 tablet 1  . fluticasone (FLONASE) 50 MCG/ACT nasal spray PLACE 2 SPRAYS INTO BOTH NOSTRILS DAILY 16 g 3  . gabapentin (NEURONTIN) 300 MG capsule Take 1 capsule (300 mg total) by mouth at bedtime. For 1 week, then if needed can increase to twice a day for 1 week and then three a day. 90 capsule 2  . Insulin Lispro Prot & Lispro (HUMALOG MIX 75/25 KWIKPEN) (75-25) 100  UNIT/ML Kwikpen Inject 26 units in the morning and 26 units at night. Must be seen for additional refills. 45 mL 1  . Insulin Pen Needle (RELION PEN NEEDLES) 32G X 4 MM MISC Use to inject insulin 4 times a day 200 each 3  . lisinopril (PRINIVIL,ZESTRIL) 20 MG tablet Take 1 tablet (20 mg total) by mouth daily. 30 tablet 2  . meloxicam (MOBIC) 7.5 MG tablet Take 1 tablet (7.5 mg total) by mouth daily. 10 tablet 0  . metFORMIN (GLUCOPHAGE) 1000 MG tablet Take 1 tablet (1,000 mg total) by mouth 2 (two) times daily with a meal. (Patient taking differently: Take 1,000 mg by mouth daily with breakfast. ) 180 tablet 3  . metoprolol succinate (TOPROL-XL) 100 MG 24 hr tablet TAKE ONE TABLET BY MOUTH ONCE DAILY WITH OR  IMMEDIATELY FOLLOWING A MEAL. 90 tablet 0  . mupirocin ointment (BACTROBAN) 2 % Apply 1 application topically 2 (two) times daily. 30 g 2  . neomycin-bacitracin-polymyxin (NEOSPORIN) ointment Apply 1 application topically every 12 (twelve) hours. apply to toe 15 g 11  . pantoprazole (PROTONIX) 20 MG tablet Take 1 tablet (20 mg total) by mouth daily. Take 30 minutes before breakfast 90 tablet 3  . polyethylene glycol (MIRALAX / GLYCOLAX) packet Take 17 g by mouth daily. 30 each 11  . zoster vaccine live, PF, (ZOSTAVAX) 03009 UNT/0.65ML injection Inject 19,400 Units into the skin once. 1 each 0  . Dulaglutide (TRULICITY) 1.5 QZ/3.0QT SOPN  Inject 1.5 mg into the skin once a week. (Patient not taking: Reported on 06/04/2016) 4 pen 2   No current facility-administered medications for this visit.     Allergies  Allergen Reactions  . Morphine     REACTION: Reaction not known  . Norvasc [Amlodipine]     Made her sick  . Olmesartan     nausea    Social History   Social History  . Marital status: Married    Spouse name: N/A  . Number of children: N/A  . Years of education: 9th   Occupational History  . Not on file.   Social History Main Topics  . Smoking status: Never Smoker  .  Smokeless tobacco: Current User    Types: Snuff, Chew  . Alcohol use No  . Drug use: No  . Sexual activity: Not on file   Other Topics Concern  . Not on file   Social History Narrative  . No narrative on file     Review of Systems: General: negative for chills, fever, night sweats or weight changes.  Cardiovascular: negative for chest pain, dyspnea on exertion, edema, orthopnea, palpitations, paroxysmal nocturnal dyspnea or shortness of breath Dermatological: negative for rash Respiratory: negative for cough or wheezing Urologic: negative for hematuria Abdominal: negative for nausea, vomiting, diarrhea, bright red blood per rectum, melena, or hematemesis Neurologic: negative for visual changes, syncope, or dizziness All other systems reviewed and are otherwise negative except as noted above.    Blood pressure (!) 142/80, height '5\' 2"'  (1.575 m), weight 187 lb 9.6 oz (85.1 kg).  General appearance: alert and no distress Neck: no adenopathy, no carotid bruit, no JVD, supple, symmetrical, trachea midline and thyroid not enlarged, symmetric, no tenderness/mass/nodules Lungs: clear to auscultation bilaterally Heart: regular rate and rhythm, S1, S2 normal, no murmur, click, rub or gallop Extremities: extremities normal, atraumatic, no cyanosis or edema  EKG Sinus rhythm at 66 without ST or T-wave changes. I personally reviewed this EKG.  ASSESSMENT AND PLAN:   Essential hypertension History of hypertension blood pressure 110 122/80. She is on metoprolol and lisinopril. Continue current meds at current dosing  Dyspnea on exertion Cardiac catheterization performed in 2001 showed "small vessel disease" but no large vessel disease that would explain this.  Diabetic peripheral neuropathy [facility]) Destiny Russell was referred by Dr. Earleen Russell because of numbness in her feet and a painful right great toe. I suspect her right great toe is related to gout. I suspect that her numbness is related to  diabetic peripheral neuropathy. Her pulses are palpable and her ABIs are normal. I'm going to get larger arterial Doppler studies I will see her back when necessary.      Destiny Harp MD FACP,FACC,FAHA, [facility] 06/04/2016 2:50 PM

## 2016-06-04 NOTE — Patient Instructions (Signed)
Medication Instructions: No changes   Procedures/Testing: Your physician has ordered a Lower Extremity Arterial Duplex. This will take place at [address], [address]   Follow-Up: Please follow up as needed.    If you need a refill on your cardiac medications before your next appointment, please call your pharmacy.

## 2016-06-04 NOTE — Assessment & Plan Note (Signed)
History of hypertension blood pressure 110 122/80. She is on metoprolol and lisinopril. Continue current meds at current dosing

## 2016-06-04 NOTE — Assessment & Plan Note (Signed)
Cardiac catheterization performed in 2001 showed "small vessel disease" but no large vessel disease that would explain this.

## 2016-06-04 NOTE — Addendum Note (Signed)
Addended by: Sandi MariscalICHARDSON, Mirza Fessel Y on: 06/04/2016 03:00 PM   Modules accepted: Orders

## 2016-06-11 ENCOUNTER — Ambulatory Visit: Payer: Medicare Other | Attending: Oncology | Admitting: Physical Therapy

## 2016-06-11 ENCOUNTER — Ambulatory Visit (INDEPENDENT_AMBULATORY_CARE_PROVIDER_SITE_OTHER): Payer: Medicare Other | Admitting: Podiatry

## 2016-06-11 ENCOUNTER — Encounter: Payer: Self-pay | Admitting: Podiatry

## 2016-06-11 DIAGNOSIS — R2689 Other abnormalities of gait and mobility: Secondary | ICD-10-CM | POA: Diagnosis not present

## 2016-06-11 DIAGNOSIS — S91209D Unspecified open wound of unspecified toe(s) with damage to nail, subsequent encounter: Secondary | ICD-10-CM

## 2016-06-11 DIAGNOSIS — M79674 Pain in right toe(s): Secondary | ICD-10-CM | POA: Diagnosis not present

## 2016-06-11 DIAGNOSIS — R2681 Unsteadiness on feet: Secondary | ICD-10-CM | POA: Diagnosis not present

## 2016-06-11 NOTE — Progress Notes (Signed)
  Subjective: 66 year old female presents the office today for follow-up evaluation of right big toe pain. She states of the wound on the toe is healed and overall the right big toe is doing much better. She states that where the toenail was removed she does have a small piece the nail within the nail corner. She denies any swelling or redness or any drainage. She did follow up with Dr. Gery PrayBarry was felt that adequate circulation. Denies any systemic complaints such as fevers, chills, nausea, vomiting. No acute changes since last appointment, and no other complaints at this time.   Objective: AAO x3, NAD DP/PT pulses decreased bilaterally, CRT less than 3 seconds At this time the nail bed is well-healed and there is no ulceration identified at this time. Within the lateral nail borders a very small piece of nail which remains which was sharply debrided to a complications or bleeding. There is no edema, erythema, drainage or pus in there is no clinical signs of infection. Is no malodor. No ascending cellulitis. No other open lesions or pre-ulcerative lesions identified today.  No pain with calf compression, swelling, warmth, erythema  Assessment: Right hallux nailbed status post traumatic avulsion with healed wound bed  Plan: -All treatment options discussed with the patient including all alternatives, risks, complications.  -At this time the wound is healed is no signs of infection. The nail was sharply debrided to symptomatic piece of small nail and the nail border. Continue to monitor for any reoccurrence or signs of infection. Discussed we will have to wait and see the toenail grows back in if there is any issues we can address at that point. She does not have gout is no clinical signs this. This was a concern apparently by Dr. Gery PrayBarry. -Follow-up if needed. Call any questions or concerns.  Destiny Russell, DPM

## 2016-06-12 NOTE — Therapy (Signed)
[facility] [address] [place], [place], [address] Phone: [phone]   Fax:  [phone]  Physical Therapy Treatment  Patient Details  Name: Destiny Russell MRN: [id number] Date of Birth: 1950-10-01 Referring Provider: Dr. Hyacinth Meeker  Encounter Date: 06/11/2016      PT End of Session - 06/12/16 1252    Visit Number 5   Number of Visits 9   Date for PT Re-Evaluation 06/18/16   Authorization Type Medicare   Authorization Time Period 05-18-16 - 07-17-16   PT Start Time 0847   PT Stop Time 0933   PT Time Calculation (min) 46 min   Equipment Utilized During Treatment Gait belt      Past Medical History:  Diagnosis Date  . Coronary atherosclerosis 04/13/2008   cardiac catheterization in 2001 (due to dyspnea on exertion) that showed small vessel coronary disease; Myoview 2010 probable soft tissue attenuation but overall normal perfusion and normal LV function. An echocardiogram 2004 revealed normal LV function, mild left atrial enlargement and mild mitral regurgitation. Medical management.  Marland Kitchen GERD (gastroesophageal reflux disease)   . History of cataract surgery 03/01/2015  . Hyperlipidemia 04/13/2008   Declines statins - myalgias  . Hypertension   . Nail avulsion of toe 11/26/2015   right great toe - nontraumatic  . Parkinson's disease (HCC)    questionable; patient endorses diagnosis; no symptoms  . Type 2 diabetes mellitus with proliferative retinopathy without macular edema (HCC) 04/13/2008   [facility]06/14 [facility]: Severe proliferative retinopathy with clinically significant macular edema, stable vitreous hemorrhage of left eye, and stable age-related nuclear cataract of both eyes.  08/09/14: [facility]: Bilateral rapidly progressive diabetic retinopathy with no macular edema     Past Surgical History:  Procedure Laterality Date  . CARDIAC CATHETERIZATION    . cataract    . CESAREAN  SECTION      There were no vitals filed for this visit.      Subjective Assessment - 06/12/16 1249    Subjective Pt states she is walking 5 times around track still but has occasional unsteadiness while walking - spontaneous in occurrence- states she will sway but able to recover balance   Patient is accompained by: Family member   Pertinent History IDDM; HTN; coronary atherosclerosis; Parkinson's disease (questionable)   Patient Stated Goals "get back to walking like I was, get rid of the cane and get rid of vertigo"   Currently in Pain? No/denies                         [facility] Adult PT Treatment/Exercise - 06/12/16 0001      Transfers   Transfers Sit to Stand   Number of Reps 10 reps   Comments on blue Airex without UE support     Ambulation/Gait   Ambulation/Gait Yes   Ambulation/Gait Assistance 5: Supervision   Ambulation Distance (Feet) 220 Feet   Assistive device None   Gait Pattern Within Functional Limits   Ambulation Surface Level;Indoor   Stairs Yes   Stairs Assistance 5: Supervision   Stairs Assistance Details (indicate cue type and reason) Pt amb. with horizontal, vertical and diagonal head turns    Stair Management Technique No rails   Number of Stairs 4   Height of Stairs 6     Neuro Re-ed    Neuro Re-ed Details  Pt performed standing balance activity on blue mat on top  of incline and decline;  marching in place with no head turns EO, head turns side to side, up/down with targets with EO and then with EC with CGA; alternate stepping up/back and down back 10 reps each foot with CGA; walking forwards and backwards on mat on incline and decline, with no head turns then progressing to head turns horizontally both forwards and backwards     Exercises   Exercises Knee/Hip     Knee/Hip Exercises: Aerobic   Recumbent Bike SciFit level 2.0 x 5"      Knee/Hip Exercises: Machines for Strengthening   Cybex Leg Press bil. LE's 50# 1 set 10 reps      Knee/Hip Exercises: Standing   Heel Raises 10 reps   Forward Step Up Right;Left;1 set;10 reps;Step Height: 6"             Balance Exercises - 06/12/16 1251      Balance Exercises: Standing   Standing Eyes Opened Narrow base of support (BOS);Foam/compliant surface;2 reps;20 secs   Standing Eyes Closed Wide (BOA);Foam/compliant surface;5 reps  alternate stepping on incline and decline   Rockerboard Anterior/posterior;EO;30 seconds;Intermittent UE support                PT Long Term Goals - 05/20/16 1323      PT LONG TERM GOAL #1   Title Pt will improve TUG score from 16.19 secs to </= 13.5 secs with RW to reduce fall risk.  06-18-16   Time 4   Period Weeks   Status New     PT LONG TERM GOAL #2   Title Pt will improve gait velocity from 2.24 ft/sec to >/= 2.7 ft/sec with RW for incr. gait efficiency.  06-18-16   Baseline 14.65 secs = 2.24 ft/sec   Time 4   Period Weeks   Status New     PT LONG TERM GOAL #3   Title Independent in HEP for balance and vestibular exercises.  06-18-16   Time 4   Period Weeks   Status New     PT LONG TERM GOAL #4   Title Increase FOTO from 45/100 to 50/100 and increase DHI score from 82% to </= 60% to demo improvement in vertigo.  06-18-16   Baseline 45/100 with risk adjusted 47/100:  DHI 82%   Time 4   Period Weeks   Status New               Plan - 06/11/16 1013    Clinical Impression Statement Pt continues to progress well towards goals with no c/o dizziness; pt appears to have some vestibular input deficits in maintaining balance on compliant surfaces with EC as evidenced by some unsteadiness and mild LOB;  pt continues to progress well with gait as she amb. with head turns without device with only 1 occurrence of unsteadiness noted but pt able to recover independently   Rehab Potential Good   PT Frequency 2x / week   PT Duration 4 weeks   PT Treatment/Interventions ADLs/Self Care Home Management;Functional mobility  training;Stair training;Gait training;Therapeutic activities;Therapeutic exercise;Balance training;Neuromuscular re-education;Patient/family education;Vestibular   PT Next Visit Plan check LTG's next week - plan to renew for 2 weeks   PT Home Exercise Plan balance   Consulted and Agree with Plan of Care Patient;Family member/caregiver   Family Member Consulted son      Patient will benefit from skilled therapeutic intervention in order to improve the following deficits and impairments:  Dizziness, Decreased balance, Decreased strength, Decreased  mobility, Decreased activity tolerance, Difficulty walking  Visit Diagnosis: Other abnormalities of gait and mobility  Unsteadiness on feet     Problem List Patient Active Problem List   Diagnosis Date Noted  . Dyspnea on exertion 06/04/2016  . Diabetic peripheral neuropathy (HCC) 06/04/2016  . Dizziness 05/07/2016  . Nail avulsion of toe 11/26/2015  . Right shoulder pain 06/01/2015  . GERD (gastroesophageal reflux disease) 10/28/2013  . Healthcare maintenance 09/07/2012  . Hyperlipidemia 04/13/2008  . Essential hypertension 04/13/2008  . Coronary atherosclerosis 04/13/2008  . Type 2 diabetes mellitus with proliferative retinopathy without macular edema (HCC) 04/13/2008    Samone Guhl, Donavan BurnetLinda Suzanne, PT 06/12/2016, 12:58 PM  [facility] [address] [place], [place], [address] Phone: [phone]   Fax:  [phone]  Name: Destiny Russell MRN: [id number] Date of Birth: [birth year]

## 2016-06-17 ENCOUNTER — Telehealth: Payer: Self-pay | Admitting: *Deleted

## 2016-06-17 ENCOUNTER — Encounter: Payer: Medicare Other | Admitting: Internal Medicine

## 2016-06-17 NOTE — Telephone Encounter (Signed)
Destiny Lennice SitesM Hurwitz is a 66 y.o. female who was contacted on behalf of [facility].   Diabetes Assessment  DM meds and BS checks -  "What medications are you taking for diabetes?" Pt named the medications she is taking. -  "How often are you checking your blood sugars at home?" Stated 2-3 times per week. - "What have your blood sugars been?" "Up and down; a little high".Stated BS may range from 150's to 200's.  High Blood Pressure Assessment  BP meds & BP checks -  "What medications are you taking for high blood pressure?" Pt was able to name medications also for BP. - "How often are you checking your blood pressure at home?" Stated she does have a machine at home. But it is taken when she goes to therapy and "other places". - "What have your blood pressure readings been?" Stated they have been ok.  Coping with DM and BP - What else are you doing to help yourself with your DM and BP - Diet?She has been monitoring her diet "pretty much".  Exercise? Yes, tries to walk around the track at her [employer] 5 times a week.  Medication Access Issues  Medication Issues? -  "Are you getting your medicines refilled on time without skipping any doses?" Stated she takes all her medications; she was taken off one med and started on a new one (Jardiance). And taken off Lisinopril and put on combo pill stated per pt. - "Are you having any problems getting/taking your medicines? - cost, timing, transportation?" No problems getting her medications.  Conclusion  Close the call - Confirmed importance and date/time of follow-up visits scheduled - pt has an appt w/Dr Samuella CotaSvalina on 7/18-reminded pt to keep her appt; stated she will. - "Any other questions or concerns?" Voiced no problems nor concerns.  - Informed pt thanks for speaking with me and have a good day.

## 2016-06-18 ENCOUNTER — Ambulatory Visit: Payer: Medicare Other | Admitting: Physical Therapy

## 2016-06-18 DIAGNOSIS — R2689 Other abnormalities of gait and mobility: Secondary | ICD-10-CM

## 2016-06-18 DIAGNOSIS — R2681 Unsteadiness on feet: Secondary | ICD-10-CM | POA: Diagnosis not present

## 2016-06-19 NOTE — Therapy (Signed)
[facility] [address] [place], [place], [address] Phone: [phone]   Fax:  [phone]  Physical Therapy Treatment  Patient Details  Name: Destiny Russell MRN: [id number] Date of Birth: April 07, 1950 Referring Provider: Dr. Hyacinth Meeker  Encounter Date: 06/18/2016      PT End of Session - 06/19/16 2011    Visit Number 6   Number of Visits 9   Date for PT Re-Evaluation 07/17/16   Authorization Type Medicare   Authorization Time Period 05-18-16 - 07-17-16   PT Start Time 0932   PT Stop Time 1016   PT Time Calculation (min) 44 min      Past Medical History:  Diagnosis Date  . Coronary atherosclerosis 04/13/2008   cardiac catheterization in 2001 (due to dyspnea on exertion) that showed small vessel coronary disease; Myoview 2010 probable soft tissue attenuation but overall normal perfusion and normal LV function. An echocardiogram 2004 revealed normal LV function, mild left atrial enlargement and mild mitral regurgitation. Medical management.  Marland Kitchen GERD (gastroesophageal reflux disease)   . History of cataract surgery 03/01/2015  . Hyperlipidemia 04/13/2008   Declines statins - myalgias  . Hypertension   . Nail avulsion of toe 11/26/2015   right great toe - nontraumatic  . Parkinson's disease (HCC)    questionable; patient endorses diagnosis; no symptoms  . Type 2 diabetes mellitus with proliferative retinopathy without macular edema (HCC) 04/13/2008   [facility]: Severe proliferative retinopathy with clinically significant macular edema, stable vitreous hemorrhage of left eye, and stable age-related nuclear cataract of both eyes.  08/09/14: [facility]: Bilateral rapidly progressive diabetic retinopathy with no macular edema     Past Surgical History:  Procedure Laterality Date  . CARDIAC CATHETERIZATION    . cataract    . CESAREAN SECTION      There were no vitals filed for  this visit.      Subjective Assessment - 06/19/16 2007    Subjective Pt reports no problems since previous visit - continues to do exercises at home   Patient is accompained by: Family member   Pertinent History IDDM; HTN; coronary atherosclerosis; Parkinson's disease (questionable)   Patient Stated Goals "get back to walking like I was, get rid of the cane and get rid of vertigo"   Currently in Pain? No/denies                         [facility] Adult PT Treatment/Exercise - 06/19/16 0001      Transfers   Transfers Sit to Stand   Number of Reps 10 reps   Comments on blue Airex without UE support     Knee/Hip Exercises: Aerobic   Recumbent Bike SciFit level 2.0 x 5"      Knee/Hip Exercises: Machines for Strengthening   Cybex Leg Press bil. LE's 50# 2 sets 10 reps     Knee/Hip Exercises: Standing   Heel Raises Both;1 set;10 reps  unilaterally 10 reps each   Forward Step Up Right;Left;Step Height: 6";10 reps             Balance Exercises - 06/19/16 2010      Balance Exercises: Standing   Standing Eyes Closed Wide (BOA);Solid surface;2 reps   SLS Intermittent upper extremity support;2 reps;10 secs   Rockerboard Anterior/posterior;EO;30 seconds   Sidestepping 2 reps   Other Standing Exercises standing on balance beam - horizontal head turns 5 reps  to right and left sides:  stepping over and back of beam  10 reps each leg with min assist                 PT Long Term Goals - 06/19/16 2008      PT LONG TERM GOAL #1   Title Pt will improve TUG score from 16.19 secs to </= 13.5 secs with RW to reduce fall risk.  06-18-16   Status On-going     PT LONG TERM GOAL #2   Title Pt will improve gait velocity from 2.24 ft/sec to >/= 2.7 ft/sec with RW for incr. gait efficiency.  06-18-16   Status On-going     PT LONG TERM GOAL #3   Title Independent in HEP for balance and vestibular exercises.  06-18-16   Status On-going     PT LONG TERM GOAL #4   Title  Increase FOTO from 45/100 to 50/100 and increase DHI score from 82% to </= 60% to demo improvement in vertigo.  06-18-16   Baseline 45/100 with risk adjusted 47/100:  DHI 82%   Status On-going               Plan - 06/19/16 2012    Clinical Impression Statement Pt progressing well towards goals; continues to have decr. SLS on each leg   Rehab Potential Good   PT Frequency 2x / week   PT Duration 4 weeks   PT Next Visit Plan cont balance and LE strengthening   PT Home Exercise Plan balance   Consulted and Agree with Plan of Care Patient;Family member/caregiver   Family Member Consulted son      Patient will benefit from skilled therapeutic intervention in order to improve the following deficits and impairments:  Dizziness, Decreased balance, Decreased strength, Decreased mobility, Decreased activity tolerance, Difficulty walking  Visit Diagnosis: Other abnormalities of gait and mobility  Unsteadiness on feet     Problem List Patient Active Problem List   Diagnosis Date Noted  . Dyspnea on exertion 06/04/2016  . Diabetic peripheral neuropathy (HCC) 06/04/2016  . Dizziness 05/07/2016  . Nail avulsion of toe 11/26/2015  . Right shoulder pain 06/01/2015  . GERD (gastroesophageal reflux disease) 10/28/2013  . Healthcare maintenance 09/07/2012  . Hyperlipidemia 04/13/2008  . Essential hypertension 04/13/2008  . Coronary atherosclerosis 04/13/2008  . Type 2 diabetes mellitus with proliferative retinopathy without macular edema (HCC) 04/13/2008    Alwyn Cordner, Donavan BurnetLinda Suzanne, PT 06/19/2016, 8:15 PM  [facility] [address] [place], [place], [address] Phone: [phone]   Fax:  [phone]  Name: [place] M Dooly MRN: [id number] Date of Birth: 1950/01/08

## 2016-06-22 ENCOUNTER — Encounter (HOSPITAL_COMMUNITY): Payer: Medicare Other

## 2016-06-23 ENCOUNTER — Ambulatory Visit: Payer: Medicare Other | Admitting: Physical Therapy

## 2016-06-23 DIAGNOSIS — R2681 Unsteadiness on feet: Secondary | ICD-10-CM | POA: Diagnosis not present

## 2016-06-23 DIAGNOSIS — R2689 Other abnormalities of gait and mobility: Secondary | ICD-10-CM

## 2016-06-24 NOTE — Therapy (Signed)
[facility] [address] [place], [place], [address] Phone: [phone]   Fax:  [phone]  Physical Therapy Treatment  Patient Details  Name: Destiny Russell MRN: [id number] Date of Birth: [birth year] Referring Provider: Dr. Hyacinth Meekerasrif Ahmed  Encounter Date: 06/23/2016      PT End of Session - 06/24/16 1750    Visit Number 7   Number of Visits 9   Date for PT Re-Evaluation 07/17/16   Authorization Type Medicare   Authorization Time Period 05-18-16 - 07-17-16   PT Start Time 0800   PT Stop Time 0846   PT Time Calculation (min) 46 min      Past Medical History:  Diagnosis Date  . Coronary atherosclerosis 04/13/2008   cardiac catheterization in 2001 (due to dyspnea on exertion) that showed small vessel coronary disease; Myoview 2010 probable soft tissue attenuation but overall normal perfusion and normal LV function. An echocardiogram 2004 revealed normal LV function, mild left atrial enlargement and mild mitral regurgitation. Medical management.  Marland Kitchen. GERD (gastroesophageal reflux disease)   . History of cataract surgery 03/01/2015  . Hyperlipidemia 04/13/2008   Declines statins - myalgias  . Hypertension   . Nail avulsion of toe 11/26/2015   right great toe - nontraumatic  . Parkinson's disease (HCC)    questionable; patient endorses diagnosis; no symptoms  . Type 2 diabetes mellitus with proliferative retinopathy without macular edema (HCC) 04/13/2008   [facility]14 [facility]: Severe proliferative retinopathy with clinically significant macular edema, stable vitreous hemorrhage of left eye, and stable age-related nuclear cataract of both eyes.  08/09/14: [facility]: Bilateral rapidly progressive diabetic retinopathy with no macular edema     Past Surgical History:  Procedure Laterality Date  . CARDIAC CATHETERIZATION    . cataract    . CESAREAN SECTION      There were no vitals filed for  this visit.      Subjective Assessment - 06/24/16 1743    Subjective Pt states she continues to walk at 6:00 in the mornings - states she is doing well   Patient is accompained by: Family member   Pertinent History IDDM; HTN; coronary atherosclerosis; Parkinson's disease (questionable)   Patient Stated Goals "get back to walking like I was, get rid of the cane and get rid of vertigo"   Currently in Pain? No/denies                         [facility] Adult PT Treatment/Exercise - 06/24/16 0001      Transfers   Transfers Sit to Stand   Number of Reps 10 reps   Comments on blue Airex without UE support     Neuro Re-ed    Neuro Re-ed Details  Pt performed standing balance activity on blue mat on top of incline and decline;  marching in place with no head turns EO, head turns side to side, up/down with targets with EO and then with EC with CGA; alternate stepping up/back and down back 10 reps each foot with CGA; walking forwards and backwards on mat on incline and decline, with no head turns then progressing to head turns horizontally both forwards and backwards     Knee/Hip Exercises: Aerobic   Recumbent Bike SciFit level 2.0 x 5"      Knee/Hip Exercises: Machines for Strengthening   Cybex Leg Press bil. LE's 50# 2 sets 10 reps     Knee/Hip  Exercises: Standing   Heel Raises Both;1 set;10 reps  unilaterally 10 reps each   Forward Step Up Right;Left;Step Height: 6";10 reps             Balance Exercises - 06/24/16 1749      Balance Exercises: Standing   Standing Eyes Closed Narrow base of support (BOS);Wide (BOA);Head turns;Foam/compliant surface;5 reps   Rockerboard Anterior/posterior;EO;30 seconds   Sidestepping 2 reps  with squats inside // bars   Other Standing Exercises standing on balance beam - horizontal head turns 5 reps to right and left sides:  stepping over and back of beam  10 reps each leg with min assist                 PT Long Term Goals  - 06/19/16 2008      PT LONG TERM GOAL #1   Title Pt will improve TUG score from 16.19 secs to </= 13.5 secs with RW to reduce fall risk.  06-18-16   Status On-going     PT LONG TERM GOAL #2   Title Pt will improve gait velocity from 2.24 ft/sec to >/= 2.7 ft/sec with RW for incr. gait efficiency.  06-18-16   Status On-going     PT LONG TERM GOAL #3   Title Independent in HEP for balance and vestibular exercises.  06-18-16   Status On-going     PT LONG TERM GOAL #4   Title Increase FOTO from 45/100 to 50/100 and increase DHI score from 82% to </= 60% to demo improvement in vertigo.  06-18-16   Baseline 45/100 with risk adjusted 47/100:  DHI 82%   Status On-going               Plan - 06/24/16 1750    Clinical Impression Statement Pt progressing well towards goals - continues to fatigue somewhat quickly but able to recover after short seated rest period   Rehab Potential Good   PT Frequency 2x / week   PT Duration 4 weeks   PT Treatment/Interventions ADLs/Self Care Home Management;Functional mobility training;Stair training;Gait training;Therapeutic activities;Therapeutic exercise;Balance training;Neuromuscular re-education;Patient/family education;Vestibular   PT Next Visit Plan cont balance and LE strengthening   PT Home Exercise Plan balance   Consulted and Agree with Plan of Care Patient;Family member/caregiver   Family Member Consulted son      Patient will benefit from skilled therapeutic intervention in order to improve the following deficits and impairments:  Dizziness, Decreased balance, Decreased strength, Decreased mobility, Decreased activity tolerance, Difficulty walking  Visit Diagnosis: Other abnormalities of gait and mobility  Unsteadiness on feet     Problem List Patient Active Problem List   Diagnosis Date Noted  . Dyspnea on exertion 06/04/2016  . Diabetic peripheral neuropathy (HCC) 06/04/2016  . Dizziness 05/07/2016  . Nail avulsion of toe  11/26/2015  . Right shoulder pain 06/01/2015  . GERD (gastroesophageal reflux disease) 10/28/2013  . Healthcare maintenance 09/07/2012  . Hyperlipidemia 04/13/2008  . Essential hypertension 04/13/2008  . Coronary atherosclerosis 04/13/2008  . Type 2 diabetes mellitus with proliferative retinopathy without macular edema (HCC) 04/13/2008    Dante Roudebush, Donavan Burnet, PT 06/24/2016, 5:53 PM  [facility] [address] [place], [place], [address] Phone: [phone]   Fax:  [phone]  Name: [place] M Reddix MRN: [id number] Date of Birth: 11/22/1950

## 2016-06-25 ENCOUNTER — Other Ambulatory Visit: Payer: Self-pay | Admitting: Internal Medicine

## 2016-06-25 DIAGNOSIS — J309 Allergic rhinitis, unspecified: Secondary | ICD-10-CM

## 2016-06-25 DIAGNOSIS — E113599 Type 2 diabetes mellitus with proliferative diabetic retinopathy without macular edema, unspecified eye: Secondary | ICD-10-CM

## 2016-06-25 DIAGNOSIS — I10 Essential (primary) hypertension: Secondary | ICD-10-CM

## 2016-06-26 ENCOUNTER — Encounter: Payer: Medicare Other | Admitting: Physical Therapy

## 2016-07-01 ENCOUNTER — Encounter: Payer: Medicare Other | Admitting: Physical Therapy

## 2016-07-13 ENCOUNTER — Ambulatory Visit: Payer: Medicare Other | Attending: Oncology | Admitting: Physical Therapy

## 2016-07-13 DIAGNOSIS — R2681 Unsteadiness on feet: Secondary | ICD-10-CM

## 2016-07-13 DIAGNOSIS — R2689 Other abnormalities of gait and mobility: Secondary | ICD-10-CM

## 2016-07-14 NOTE — Therapy (Signed)
[facility] [address] [place], [place], [address] Phone: [phone]   Fax:  [phone]  Physical Therapy Treatment  Patient Details  Name: Destiny Russell MRN: [id number] Date of Birth: 03-Jun-1950 Referring Provider: Dr. Dellia Nims  Encounter Date: 07/13/2016      PT End of Session - 07/14/16 2126    Visit Number 8   Number of Visits 9   Date for PT Re-Evaluation 07/17/16   Authorization Type Medicare   Authorization Time Period 05-18-16 - 07-17-16   PT Start Time 1018   PT Stop Time 1100   PT Time Calculation (min) 42 min      Past Medical History:  Diagnosis Date  . Coronary atherosclerosis 04/13/2008   cardiac catheterization in 2001 (due to dyspnea on exertion) that showed small vessel coronary disease; Myoview 2010 probable soft tissue attenuation but overall normal perfusion and normal LV function. An echocardiogram 2004 revealed normal LV function, mild left atrial enlargement and mild mitral regurgitation. Medical management.  Marland Kitchen GERD (gastroesophageal reflux disease)   . History of cataract surgery 03/01/2015  . Hyperlipidemia 04/13/2008   Declines statins - myalgias  . Hypertension   . Nail avulsion of toe 11/26/2015   right great toe - nontraumatic  . Parkinson's disease ([place])    questionable; patient endorses diagnosis; no symptoms  . Type 2 diabetes mellitus with proliferative retinopathy without macular edema ([place]) 04/13/2008   06/06/14 [facility]: Severe proliferative retinopathy with clinically significant macular edema, stable vitreous hemorrhage of left eye, and stable age-related nuclear cataract of both eyes.  08/09/14: [facility]: Bilateral rapidly progressive diabetic retinopathy with no macular edema     Past Surgical History:  Procedure Laterality Date  . CARDIAC CATHETERIZATION    . cataract    . CESAREAN SECTION      There were no vitals filed for  this visit.      Subjective Assessment - 07/14/16 2045    Subjective Pt reports she is doing well - feels she is ready for discharge    Pertinent History IDDM; HTN; coronary atherosclerosis; Parkinson's disease (questionable)   Patient Stated Goals "get back to walking like I was, get rid of the cane and get rid of vertigo"   Currently in Pain? No/denies                         [facility] Adult PT Treatment/Exercise - 07/14/16 0001      Ambulation/Gait   Ambulation/Gait Yes   Ambulation/Gait Assistance 5: Supervision   Ambulation Distance (Feet) 115 Feet   Assistive device None   Gait Pattern Within Functional Limits   Ambulation Surface Level;Indoor     Knee/Hip Exercises: Aerobic   Recumbent Bike SciFit level 2.0 x 5"      Knee/Hip Exercises: Machines for Strengthening   Cybex Leg Press bil. LE's 60# 2 sets 10 reps     Knee/Hip Exercises: Standing   Heel Raises Both;1 set;10 reps  unilaterally 10 reps each      TUG score 10.41 secs  Gait velocity 11.84 secs = 2.77 ft/sec             PT Long Term Goals - 07/14/16 2101      PT LONG TERM GOAL #1   Title Pt will improve TUG score from 16.19 secs to </= 13.5 secs with RW to reduce fall risk.  06-18-16   Baseline 10.41  secs with no device   Time 4   Period Weeks   Status Achieved     PT LONG TERM GOAL #2   Title Pt will improve gait velocity from 2.24 ft/sec to >/= 2.7 ft/sec with RW for incr. gait efficiency.  06-18-16   Baseline 14.65 secs = 2.24 ft/sec;  11.84 secs= 2.77 ft/sec   Time 4   Period Weeks   Status Achieved     PT LONG TERM GOAL #3   Title Independent in HEP for balance and vestibular exercises.  06-18-16   Status Achieved     PT LONG TERM GOAL #4   Title Increase FOTO from 45/100 to 50/100 and increase DHI score from 82% to </= 60% to demo improvement in vertigo.  06-18-16   Baseline 45/100 with risk adjusted 47/100:  [place] 82%; score 8% on 08/10/2016   Status Achieved                Plan - 07/14/16 2127    Clinical Impression Statement Pt has met all LTG's - pt agrees with D/C   Rehab Potential Good   PT Frequency 2x / week   PT Duration 4 weeks   PT Treatment/Interventions ADLs/Self Care Home Management;Functional mobility training;Stair training;Gait training;Therapeutic activities;Therapeutic exercise;Balance training;Neuromuscular re-education;Patient/family education;Vestibular   PT Next Visit Plan cont balance and LE strengthening   PT Home Exercise Plan balance   Consulted and Agree with Plan of Care Patient;Family member/caregiver      Patient will benefit from skilled therapeutic intervention in order to improve the following deficits and impairments:  Dizziness, Decreased balance, Decreased strength, Decreased mobility, Decreased activity tolerance, Difficulty walking  Visit Diagnosis: Other abnormalities of gait and mobility  Unsteadiness on feet       G-Codes - Aug 10, 2016 2114    Functional Assessment Tool Used (Outpatient Only) TUG score 10.4 secs:  Gait velocity 2.77 ft/sec   Functional Limitation Mobility: Walking and moving around   Mobility: Walking and Moving Around Goal Status [phone]) At least 20 percent but less than 40 percent impaired, limited or restricted   Mobility: Walking and Moving Around Discharge Status [phone]) At least 1 percent but less than 20 percent impaired, limited or restricted      Problem List Patient Active Problem List   Diagnosis Date Noted  . Dyspnea on exertion 06/04/2016  . Diabetic peripheral neuropathy ([place]) 06/04/2016  . Dizziness 05/07/2016  . Nail avulsion of toe 11/26/2015  . Right shoulder pain 06/01/2015  . GERD (gastroesophageal reflux disease) 10/28/2013  . Healthcare maintenance 09/07/2012  . Hyperlipidemia 04/13/2008  . Essential hypertension 04/13/2008  . Coronary atherosclerosis 04/13/2008  . Type 2 diabetes mellitus with proliferative retinopathy without macular edema  ([facility]) 04/13/2008    PHYSICAL THERAPY DISCHARGE SUMMARY  Visits from Start of Care: 8  Current functional level related to goals / functional outcomes: See above for progress towards goals - all LTG's met   Remaining deficits: Continued decr. high level balance skills    Education / Equipment: Pt has been instructed in a HEP for balance and LE strengthening exercises.   Plan: Patient agrees to discharge.  Patient goals were met. Patient is being discharged due to meeting the stated rehab goals.  ?????         Alda Lea, PT 07/14/2016, 9:29 PM  [facility] [address] [place], [place], [address] Phone: [phone]   Fax:  [phone]  Name: Destiny Russell MRN: [id number]  Date of Birth: 1950-12-03

## 2016-07-22 ENCOUNTER — Encounter: Payer: Self-pay | Admitting: Internal Medicine

## 2016-07-22 ENCOUNTER — Other Ambulatory Visit (HOSPITAL_COMMUNITY)
Admission: RE | Admit: 2016-07-22 | Discharge: 2016-07-22 | Disposition: A | Discharge status: Home / Self Care | Payer: Medicare Other | Source: Ambulatory Visit | Attending: Internal Medicine | Admitting: Internal Medicine

## 2016-07-22 ENCOUNTER — Ambulatory Visit (HOSPITAL_COMMUNITY)
Admission: RE | Admit: 2016-07-22 | Discharge: 2016-07-22 | Disposition: A | Discharge status: Home / Self Care | Payer: Medicare Other | Source: Ambulatory Visit | Attending: Family Medicine | Admitting: Family Medicine

## 2016-07-22 ENCOUNTER — Ambulatory Visit (INDEPENDENT_AMBULATORY_CARE_PROVIDER_SITE_OTHER): Payer: Medicare Other | Admitting: Internal Medicine

## 2016-07-22 VITALS — BP 126/58 | HR 68 | Temp 98.4°F | Ht 62.0 in | Wt 187.8 lb

## 2016-07-22 DIAGNOSIS — Z124 Encounter for screening for malignant neoplasm of cervix: Secondary | ICD-10-CM

## 2016-07-22 DIAGNOSIS — N390 Urinary tract infection, site not specified: Secondary | ICD-10-CM | POA: Diagnosis not present

## 2016-07-22 DIAGNOSIS — J449 Chronic obstructive pulmonary disease, unspecified: Secondary | ICD-10-CM

## 2016-07-22 DIAGNOSIS — K7460 Unspecified cirrhosis of liver: Secondary | ICD-10-CM | POA: Diagnosis not present

## 2016-07-22 DIAGNOSIS — E113599 Type 2 diabetes mellitus with proliferative diabetic retinopathy without macular edema, unspecified eye: Secondary | ICD-10-CM | POA: Diagnosis not present

## 2016-07-22 DIAGNOSIS — E785 Hyperlipidemia, unspecified: Secondary | ICD-10-CM

## 2016-07-22 DIAGNOSIS — I10 Essential (primary) hypertension: Secondary | ICD-10-CM

## 2016-07-22 DIAGNOSIS — R079 Chest pain, unspecified: Secondary | ICD-10-CM

## 2016-07-22 DIAGNOSIS — Z794 Long term (current) use of insulin: Secondary | ICD-10-CM | POA: Diagnosis not present

## 2016-07-22 DIAGNOSIS — Z0000 Encounter for general adult medical examination without abnormal findings: Secondary | ICD-10-CM

## 2016-07-22 DIAGNOSIS — E1142 Type 2 diabetes mellitus with diabetic polyneuropathy: Secondary | ICD-10-CM

## 2016-07-22 DIAGNOSIS — E1151 Type 2 diabetes mellitus with diabetic peripheral angiopathy without gangrene: Secondary | ICD-10-CM | POA: Diagnosis not present

## 2016-07-22 DIAGNOSIS — Z7982 Long term (current) use of aspirin: Secondary | ICD-10-CM | POA: Diagnosis not present

## 2016-07-22 DIAGNOSIS — Z79899 Other long term (current) drug therapy: Secondary | ICD-10-CM | POA: Diagnosis not present

## 2016-07-22 DIAGNOSIS — I2510 Atherosclerotic heart disease of native coronary artery without angina pectoris: Secondary | ICD-10-CM | POA: Diagnosis not present

## 2016-07-22 DIAGNOSIS — E1140 Type 2 diabetes mellitus with diabetic neuropathy, unspecified: Secondary | ICD-10-CM | POA: Diagnosis not present

## 2016-07-22 DIAGNOSIS — G20 Parkinson's disease: Secondary | ICD-10-CM | POA: Diagnosis not present

## 2016-07-22 DIAGNOSIS — B1920 Unspecified viral hepatitis C without hepatic coma: Secondary | ICD-10-CM | POA: Diagnosis not present

## 2016-07-22 DIAGNOSIS — R300 Dysuria: Secondary | ICD-10-CM

## 2016-07-22 DIAGNOSIS — K219 Gastro-esophageal reflux disease without esophagitis: Secondary | ICD-10-CM | POA: Diagnosis not present

## 2016-07-22 DIAGNOSIS — Z01419 Encounter for gynecological examination (general) (routine) without abnormal findings: Secondary | ICD-10-CM

## 2016-07-22 DIAGNOSIS — E782 Mixed hyperlipidemia: Secondary | ICD-10-CM

## 2016-07-22 LAB — GLUCOSE, CAPILLARY: GLUCOSE-CAPILLARY: 92 mg/dL (ref 65–99)

## 2016-07-22 LAB — POCT URINALYSIS DIPSTICK
Bilirubin, UA: NEGATIVE
Glucose, UA: 500
Ketones, UA: NEGATIVE
Nitrite, UA: NEGATIVE
PH UA: 5.5 (ref 5.0–8.0)
Protein, UA: NEGATIVE
UROBILINOGEN UA: 0.2 U/dL

## 2016-07-22 LAB — POCT GLYCOSYLATED HEMOGLOBIN (HGB A1C): HEMOGLOBIN A1C: 9.9

## 2016-07-22 MED ORDER — LISINOPRIL 20 MG PO TABS: 20.0000 mg | ORAL_TABLET | Freq: Every day | ORAL | 3 refills | Status: DC

## 2016-07-22 MED ORDER — INSULIN GLARGINE 100 UNIT/ML SOLOSTAR PEN: 30.0000 [IU] | PEN_INJECTOR | Freq: Every day | SUBCUTANEOUS | 2 refills | Status: DC

## 2016-07-22 MED ORDER — NITROFURANTOIN MONOHYD MACRO 100 MG PO CAPS: 100.0000 mg | ORAL_CAPSULE | Freq: Two times a day (BID) | ORAL | 0 refills | Status: AC

## 2016-07-22 MED ORDER — PRAVASTATIN SODIUM 10 MG PO TABS: 20.0000 mg | ORAL_TABLET | Freq: Every evening | ORAL | 2 refills | Status: DC

## 2016-07-22 MED ORDER — INSULIN ASPART 100 UNIT/ML FLEXPEN: 10.0000 [IU] | PEN_INJECTOR | Freq: Two times a day (BID) | SUBCUTANEOUS | 4 refills | Status: DC

## 2016-07-22 NOTE — Progress Notes (Signed)
CC: dysuria, chest pain  HPI:  [name] is a 66 y.o. with a PMH of CAD, GERD, HLD, HTN, T2DM with neuropathy presenting to clinic for follow up on T2DM and evaluation for dysuria and chest pain.  Chest pain: Patient states started about 1 month ago, occurs with exertion only, is relieved with rest, is substernal and dull in nature, associated with diaphoresis but not associated with nausea, vomiting, syncope, radiation of pain, headache. She had a normal cath in 2001 and overall low risk myoview in 2010.   Dysuria:  Patient endorses 4 days of dysuria, increased urgency, increased frequency but no polyuria, hematuria, fevers, chills, n/v, flank pain, vaginal discomfort or itching.  T2DM:  Patient taking humalog 75/25 34 units BID, metformin 1000mg  daily, and empagliflozin 10mg  daily. She was on Trulicity as well but noticed consistent 3-4 days of nausea and vomiting after taking her weekly injections so she stopped taking it; she also is only taking metformin once a day because she though it might have been contributing to her symptoms. She endorses checking her CBGs at least twice a day and states they have been running consistently >200 with ~3 hypoglycemic readings at lunchtime per week.    Please see problem based Assessment and Plan for status of patients chronic conditions.  Past Medical History:  Diagnosis Date  . Coronary atherosclerosis 04/13/2008   cardiac catheterization in 2001 (due to dyspnea on exertion) that showed small vessel coronary disease; Myoview 2010 probable soft tissue attenuation but overall normal perfusion and normal LV function. An echocardiogram 2004 revealed normal LV function, mild left atrial enlargement and mild mitral regurgitation. Medical management.  Marland Kitchen. GERD (gastroesophageal reflux disease)   . History of cataract surgery 03/01/2015  . Hyperlipidemia 04/13/2008   Declines statins - myalgias  . Hypertension   . Nail avulsion of toe 11/26/2015     right great toe - nontraumatic  . Parkinson's disease ([facility])    questionable; patient endorses diagnosis; no symptoms  . Type 2 diabetes mellitus with proliferative retinopathy without macular edema ([facility]) 04/13/2008   [facility]14 [facility]: Severe proliferative retinopathy with clinically significant macular edema, stable vitreous hemorrhage of left eye, and stable age-related nuclear cataract of both eyes.  08/09/14: [facility]: Bilateral rapidly progressive diabetic retinopathy with no macular edema     Review of Systems:   Review of Systems  Constitutional: Positive for diaphoresis (with chest pain). Negative for chills, fever and malaise/fatigue.  HENT: Negative for hearing loss.   Eyes: Negative for blurred vision and double vision.  Respiratory: Negative for cough and shortness of breath.   Cardiovascular: Positive for chest pain and leg swelling (mild, goes away with elevation; since stopping HCTZ).  Gastrointestinal: Negative for abdominal pain, blood in stool, constipation, diarrhea, melena, nausea and vomiting.  Genitourinary: Positive for dysuria, frequency and urgency. Negative for flank pain and hematuria.  Musculoskeletal: Negative for falls and neck pain.  Neurological: Negative for dizziness, tingling, focal weakness, loss of consciousness, weakness and headaches.  Endo/Heme/Allergies: Negative for polydipsia.    Physical Exam:  Vitals:   07/22/16 1441  BP: (!) 126/58  Pulse: 68  Temp: 98.4 F (36.9 C)  TempSrc: Oral  SpO2: 97%  Weight: 187 lb 12.8 oz (85.2 kg)  Height: 5\' 2"  (1.575 m)   Physical Exam  Constitutional: She is oriented to person, place, and time. She appears well-developed and well-nourished. No distress.  HENT:  Head: Normocephalic and atraumatic.  Eyes: Conjunctivae and EOM are normal.  Neck: Normal range of motion. Neck supple.  Cardiovascular: Normal rate, regular rhythm and normal heart sounds.  Exam reveals no  gallop and no friction rub.   No murmur heard. Diminished PD pulses bil  Pulmonary/Chest: Effort normal and breath sounds normal. She has no wheezes. She has no rales.  Abdominal: Soft. Bowel sounds are normal. She exhibits no distension. There is no tenderness.  Genitourinary: Vagina normal and uterus normal. No vaginal discharge found.  Musculoskeletal: Normal range of motion. She exhibits edema (minimal bil LE). She exhibits no deformity.  Site of right great toe evulsion appears to be healing well; no surrounding erythema, induration, drainage  Neurological: She is alert and oriented to person, place, and time.  Skin: Skin is warm and dry. Capillary refill takes less than 2 seconds. No rash noted. She is not diaphoretic. No erythema. No pallor.  Psychiatric: She has a normal mood and affect. Her behavior is normal. Judgment and thought content normal.    Assessment & Plan:   See Encounters Tab for problem based charting.   Patient discussed with Dr. Fredrich Romans, MD Internal Medicine PGY2

## 2016-07-22 NOTE — Patient Instructions (Signed)
For your urinary tract infection, please take macrobid twice a day for 5 days.   For your cholesterol, please start taking pravastatin 10mg  daily.   For your diabetes: STOP Humulin mixed insulin START Lantus 30 units at bedtime START Novolog 10 units before breakfast and before lunch. Please check your sugar first thing in the morning and then a couple of times throughout the day.  Take metformin 1000mg  twice a day with meals. Continue jardiance once a day.  You will be called about your mammogram and bone scan appointment.  You will be called about a stress test for your chest pain.  I will call you about your blood work results.  Please make an appointment to be seen in one month to check on your diabetes. Please bring your meter to this appointment.

## 2016-07-23 ENCOUNTER — Encounter: Payer: Self-pay | Admitting: Internal Medicine

## 2016-07-23 ENCOUNTER — Telehealth: Payer: Self-pay

## 2016-07-23 LAB — BMP8+ANION GAP
Anion Gap: 17 mmol/L (ref 10.0–18.0)
BUN / CREAT RATIO: 13 (ref 12–28)
BUN: 15 mg/dL (ref 8–27)
CALCIUM: 9.5 mg/dL (ref 8.7–10.3)
CHLORIDE: 101 mmol/L (ref 96–106)
CO2: 24 mmol/L (ref 20–29)
Creatinine, Ser: 1.15 mg/dL — ABNORMAL HIGH (ref 0.57–1.00)
GFR calc non Af Amer: 49 mL/min/{1.73_m2} — ABNORMAL LOW (ref >59)
GFR, EST AFRICAN AMERICAN: 57 mL/min/{1.73_m2} — AB (ref >59)
GLUCOSE: 90 mg/dL (ref 65–99)
Potassium: 4.2 mmol/L (ref 3.5–5.2)
Sodium: 142 mmol/L (ref 134–144)

## 2016-07-23 LAB — CBC
HEMATOCRIT: 46.3 % (ref 34.0–46.6)
HEMOGLOBIN: 15.7 g/dL (ref 11.1–15.9)
MCH: 29.8 pg (ref 26.6–33.0)
MCHC: 33.9 g/dL (ref 31.5–35.7)
MCV: 88 fL (ref 79–97)
Platelets: 232 10*3/uL (ref 150–379)
RBC: 5.27 x10E6/uL (ref 3.77–5.28)
RDW: 12.9 % (ref 12.3–15.4)
WBC: 8.1 10*3/uL (ref 3.4–10.8)

## 2016-07-23 MED ORDER — INSULIN LISPRO 100 UNIT/ML ~~LOC~~ SOLN: 10.0000 [IU] | Freq: Two times a day (BID) | SUBCUTANEOUS | 6 refills | Status: DC

## 2016-07-23 MED ORDER — INSULIN LISPRO 100 UNIT/ML (KWIKPEN): 10.0000 [IU] | PEN_INJECTOR | Freq: Two times a day (BID) | SUBCUTANEOUS | 3 refills | Status: DC

## 2016-07-23 MED ORDER — PRAVASTATIN SODIUM 10 MG PO TABS: 10.0000 mg | ORAL_TABLET | Freq: Every evening | ORAL | 2 refills | Status: DC

## 2016-07-23 NOTE — Assessment & Plan Note (Signed)
Patient taking humalog 75/25 34 units BID, metformin 1000mg  daily, and empagliflozin 10mg  daily. She was on Trulicity as well but noticed consistent 3-4 days of nausea and vomiting after taking her weekly injections so she stopped taking it; she also is only taking metformin once a day because she though it might have been contributing to her symptoms. She endorses checking her CBGs at least twice a day and states they have been running consistently >200 with ~3 hypoglycemic readings at lunchtime per week.  Patient asked about switching to long acting and mealtime insulins for better control.  Plan: --Check A1c - 9.9 uncontrolled --stop humulin mix --start lantus 30 units at bedtime --start humalog 10 units with breakfast and lunch (biggest meals) --advised patient to check cbgs in AM and 2-3 times during day and bring in meter at f/u in 1 month --check microalb/cr ratio, bmet, cbc: Bmet shows elevated cr of 1.15 compared to 0.7 previously. Could be from overdiuresis with uncontrolled DM (had glucose of 500 in urine) - will f/u bmet in one week

## 2016-07-23 NOTE — Assessment & Plan Note (Signed)
Chronic and well controlled. On lisinopril 20mg  daily and metoprolol 100mg  daily.   Plan: --continue current regimen

## 2016-07-23 NOTE — Addendum Note (Signed)
Addended by: Nyra MarketSVALINA, Afnan Emberton on: 07/23/2016 05:51 PM   Modules accepted: Orders

## 2016-07-23 NOTE — Assessment & Plan Note (Addendum)
Continue ASA 81mg  daily. Patient in agreement with starting back statin.  Plan: --asa 81mg  daily --pravastatin 10mg  daily --stress test as discussed elsewhere for chest pain

## 2016-07-23 NOTE — Assessment & Plan Note (Signed)
Patient endorses 4 days of dysuria, increased urgency, increased frequency but no polyuria, hematuria, fevers, chills, n/v, flank pain, vaginal discomfort or itching.  Dipstick UA showed 500 glu, mod Hgb, mod leukocytes. Pelvic exam does not reveal discharge and symptoms not consistent with yeast infection despite glucosuria.  Plan: --Macrobid 100mg  BID x5 days as worked well for patient in past

## 2016-07-23 NOTE — Telephone Encounter (Signed)
Per patient son Destiny Russell is not covered by insurance. Please call pt back.

## 2016-07-23 NOTE — Assessment & Plan Note (Signed)
Patient with last pap smear in 2007 which was unremarkable, no others in system. She is in agreement with one final pap smear (if normal) then will end her cervical cancer screenings. She denies abnormal uterine bleeding, has not had a menstrual period since her 40's.  Plan: --pap smear/pelvic performed; f/u results --patient asked to schedule her screening mammogram and dexa scans

## 2016-07-23 NOTE — Assessment & Plan Note (Signed)
Patient states started about 1 month ago, occurs with exertion only, is relieved with rest, is substernal and dull in nature, associated with diaphoresis but not associated with nausea, vomiting, syncope, radiation of pain, headache. She had a normal cath in 2001 and overall low risk myoview in 2010.   Patient with risk factors of DM, HTN, PVD with cardiac symptoms. EKG obtained in office - personally read showed NSR, no acute ST or T wave inversion which was reassuring.  Plan: --stress test

## 2016-07-23 NOTE — Assessment & Plan Note (Signed)
Patient in agreement with starting pravastatin 10mg  daily.  Plan: --pravastatin 10mg  daily --will monitor for myalgias, uptitrate as able in the future

## 2016-07-23 NOTE — Telephone Encounter (Signed)
Have tried to call pt at both ph#'s lm for rtc

## 2016-07-23 NOTE — Assessment & Plan Note (Signed)
Well controlled on gabapentin 300mg  qhs and 300mg  daily PRN.

## 2016-07-24 LAB — CYTOLOGY - PAP: DIAGNOSIS: NEGATIVE

## 2016-07-24 LAB — MICROALBUMIN / CREATININE URINE RATIO
CREATININE, UR: 38.7 mg/dL
MICROALB/CREAT RATIO: 76.2 mg/g{creat} — AB (ref 0.0–30.0)
Microalbumin, Urine: 29.5 ug/mL

## 2016-07-27 NOTE — Progress Notes (Signed)
Internal Medicine Clinic Attending  Case discussed with Dr. Svalina  at the time of the visit.  We reviewed the resident's history and exam and pertinent patient test results.  I agree with the assessment, diagnosis, and plan of care documented in the resident's note.  

## 2016-07-27 NOTE — Telephone Encounter (Signed)
No answer

## 2016-07-28 ENCOUNTER — Encounter: Payer: Self-pay | Admitting: Internal Medicine

## 2016-07-28 ENCOUNTER — Ambulatory Visit (INDEPENDENT_AMBULATORY_CARE_PROVIDER_SITE_OTHER): Payer: Medicare Other | Admitting: Internal Medicine

## 2016-07-28 ENCOUNTER — Other Ambulatory Visit: Payer: Self-pay | Admitting: Internal Medicine

## 2016-07-28 VITALS — BP 147/55 | HR 63 | Temp 97.9°F | Ht 61.0 in | Wt 187.1 lb

## 2016-07-28 DIAGNOSIS — E113599 Type 2 diabetes mellitus with proliferative diabetic retinopathy without macular edema, unspecified eye: Secondary | ICD-10-CM

## 2016-07-28 DIAGNOSIS — I2510 Atherosclerotic heart disease of native coronary artery without angina pectoris: Secondary | ICD-10-CM

## 2016-07-28 DIAGNOSIS — Z794 Long term (current) use of insulin: Secondary | ICD-10-CM | POA: Diagnosis not present

## 2016-07-28 DIAGNOSIS — R300 Dysuria: Secondary | ICD-10-CM

## 2016-07-28 DIAGNOSIS — E785 Hyperlipidemia, unspecified: Secondary | ICD-10-CM | POA: Diagnosis not present

## 2016-07-28 DIAGNOSIS — I10 Essential (primary) hypertension: Secondary | ICD-10-CM

## 2016-07-28 MED ORDER — EXENATIDE 5 MCG/0.02ML ~~LOC~~ SOPN: 5.0000 ug | PEN_INJECTOR | Freq: Two times a day (BID) | SUBCUTANEOUS | 3 refills | Status: DC

## 2016-07-28 MED ORDER — INSULIN GLARGINE 100 UNIT/ML SOLOSTAR PEN: 40.0000 [IU] | PEN_INJECTOR | Freq: Every day | SUBCUTANEOUS | 2 refills | Status: DC

## 2016-07-28 NOTE — Assessment & Plan Note (Signed)
The patient's dysuria has decreased since starting her antibiotics after her last visit. She was instructed to continue taking this medication as previously prescribed and to return if the problem does not completely resolve. The patient will follow up as needed for this problem.

## 2016-07-28 NOTE — Assessment & Plan Note (Signed)
The patient's BG on new regimen were elevated to 268 and 308 in the morning and observed to be 140 and 192 in the afternoon. These are the only data points available since the patient implemented her new regimen on 07/26/2016. Given the elevated morning BG readings, the patient was instructed to INCREASE her Lantus to 40 units per night and to continue her 10/10 short acting insulin with breakfast and lunch.  Plan: -Continue metformin 1000 mg with breakfast -Continue empagliflozin 10 mg daily -Continue breakfast and lunch 10 units of short acting insulin -Increase Lantus to 40 units at night  -Continue daily BG monitoring -Return to clinic in 4 weeks for BG monitoring

## 2016-07-28 NOTE — Progress Notes (Deleted)
   CC: ***  HPI:  [name] is a 66 y.o.   Past Medical History:  Diagnosis Date  . Coronary atherosclerosis 04/13/2008   cardiac catheterization in 2001 (due to dyspnea on exertion) that showed small vessel coronary disease; Myoview 2010 probable soft tissue attenuation but overall normal perfusion and normal LV function. An echocardiogram 2004 revealed normal LV function, mild left atrial enlargement and mild mitral regurgitation. Medical management.  Marland Kitchen. GERD (gastroesophageal reflux disease)   . History of cataract surgery 03/01/2015  . Hyperlipidemia 04/13/2008   Declines statins - myalgias  . Hypertension   . Nail avulsion of toe 11/26/2015   right great toe - nontraumatic  . Parkinson's disease ([facility])    questionable; patient endorses diagnosis; no symptoms  . Type 2 diabetes mellitus with proliferative retinopathy without macular edema ([facility]) 04/13/2008   06/06/14 [facility]: Severe proliferative retinopathy with clinically significant macular edema, stable vitreous hemorrhage of left eye, and stable age-related nuclear cataract of both eyes.  08/09/14: [facility]: Bilateral rapidly progressive diabetic retinopathy with no macular edema    Review of Systems:  ***  Physical Exam:  There were no vitals filed for this visit. ***  Assessment & Plan:   See Encounters Tab for problem based charting.  Patient {GC/GE:[id number]::"discussed with","seen with"} Dr. {NAMES:[id number]::"Butcher","Granfortuna","E. Hoffman","Klima","Mullen","Narendra","Raines","Vincent"}

## 2016-07-28 NOTE — Progress Notes (Signed)
CC: DM follow up  HPI:  Ms.Neveen Judie PetitM Vaughan Bastaegram is a 66 y.o. with a PMH of HLD, HTN, and T2DM who is presenting today for follow up of T2DM. Per chart review the patient was last seen in the clinic for dysuria, exertional chest pain, and DM follow up on 07/22/2016. A BMP obtained at that time showed an increase in her Cr from 0.72 to 1.15 and the patient was asked to return to clinic for blood work. The patient's insulin regimen was also changed at her last visit. The patient has been taking 10 units of Humalog with breakfast and lunch as well as 30 units of Lantus nightly.   Since her last visit the patient has felt well and has no new acute complaints. She has been able to take all of her medications as prescribed since her last visit on 07/22/2016 and started her new insulin regimen on 07/26/2016. She brought her glucose logs with her and her reports show elevated BG up to 308 and 268 each morning since starting her new insulin regimen. She denies any episodes of dizziness, diaphoresis, headaches, and shakiness since starting this new regimen.  The patient states that her dysuria described at her last visit has decreased since taking her antibiotic. She also has not had any episodes of exertional chest pain since her last visit.  Past Medical History:  Diagnosis Date  . Coronary atherosclerosis 04/13/2008   cardiac catheterization in 2001 (due to dyspnea on exertion) that showed small vessel coronary disease; Myoview 2010 probable soft tissue attenuation but overall normal perfusion and normal LV function. An echocardiogram 2004 revealed normal LV function, mild left atrial enlargement and mild mitral regurgitation. Medical management.  Marland Kitchen. GERD (gastroesophageal reflux disease)   . History of cataract surgery 03/01/2015  . Hyperlipidemia 04/13/2008   Declines statins - myalgias  . Hypertension   . Nail avulsion of toe 11/26/2015   right great toe - nontraumatic  . Parkinson's disease ([facility])    questionable; patient endorses diagnosis; no symptoms  . Type 2 diabetes mellitus with proliferative retinopathy without macular edema ([facility]) 04/13/2008   [facility]14 [facility]: Severe proliferative retinopathy with clinically significant macular edema, stable vitreous hemorrhage of left eye, and stable age-related nuclear cataract of both eyes.  08/09/14: [facility]: Bilateral rapidly progressive diabetic retinopathy with no macular edema    Review of Systems:   Patient denies fevers, cough, abdominal pain, chest pain, SOB, and change in bowel movments  Physical Exam:  Vitals:   07/28/16 1538 07/28/16 1624  BP: (!) 163/68 (!) 147/55  Pulse: 64 63  Temp: 97.9 F (36.6 C)   TempSrc: Oral   SpO2: 99%   Weight: 187 lb 1.6 oz (84.9 kg)   Height: 5\' 1"  (1.549 m)    Physical Exam  Constitutional: She appears well-developed and well-nourished. No distress.  Cardiovascular: Normal rate, regular rhythm, normal heart sounds and intact distal pulses.  Exam reveals no friction rub.   No murmur heard. Pulmonary/Chest: Effort normal and breath sounds normal. No respiratory distress. She has no wheezes.  Abdominal: Soft. She exhibits no distension. There is no tenderness. There is no guarding.  Musculoskeletal: She exhibits no edema (of bilateral lower extremities) or tenderness (of bilateral lower extremities).  Skin: Skin is warm and dry. Capillary refill takes less than 2 seconds. No rash noted. She is not diaphoretic. No erythema.   Assessment & Plan:   See Encounters Tab for problem based charting.  Patient seen with Dr. Oswaldo Done.

## 2016-07-28 NOTE — Assessment & Plan Note (Signed)
Patient's BP elevated on first read today but on second read her BP was near goal of 140/90. The patient is currently taking lisinopril 20 mg daily and metoprolol 100 mg daily. Her BP has been well controlled on this regimen on past visits and no medications were added today. It is possible that her slight elevation in BP is 2/2 resolving AKI, as her Cr recently bumped from 0.73 to 1.15. A BMP was obtained on this visit to follow up Cr function and this problem should be closely monitored at follow up in 1 month to see if another agent is needed for better control.   Plan: -Follow up BMP -Continue lisinopril 20 mg daily and metoprolol 100 mg daily -Follow up in 1 month with PCP for BP recheck

## 2016-07-28 NOTE — Patient Instructions (Addendum)
Thank you for seeing us today. I will call you with the results of your blood work.   Please START taking 40 units of your long acting insulin medication (Lantus) each night at 10 pm.  Please call with any questions or concerns. You can follow up for your diabetes and HTN with your PCP or in the acute care clinic in 1 month as scheduled.

## 2016-07-29 LAB — BMP8+ANION GAP
Anion Gap: 17 mmol/L (ref 10.0–18.0)
BUN/Creatinine Ratio: 20 (ref 12–28)
BUN: 17 mg/dL (ref 8–27)
CALCIUM: 9.7 mg/dL (ref 8.7–10.3)
CO2: 24 mmol/L (ref 20–29)
Chloride: 101 mmol/L (ref 96–106)
Creatinine, Ser: 0.84 mg/dL (ref 0.57–1.00)
GFR, EST AFRICAN AMERICAN: 83 mL/min/{1.73_m2} (ref >59)
GFR, EST NON AFRICAN AMERICAN: 72 mL/min/{1.73_m2} (ref >59)
Glucose: 70 mg/dL (ref 65–99)
POTASSIUM: 4.1 mmol/L (ref 3.5–5.2)
Sodium: 142 mmol/L (ref 134–144)

## 2016-07-30 ENCOUNTER — Ambulatory Visit: Payer: Medicare Other

## 2016-07-31 NOTE — Progress Notes (Signed)
Internal Medicine Clinic Attending  I saw and evaluated the patient.  I personally confirmed the key portions of the history and exam documented by Dr. Nedrud and I reviewed pertinent patient test results.  The assessment, diagnosis, and plan were formulated together and I agree with the documentation in the resident's note.  

## 2016-08-24 ENCOUNTER — Other Ambulatory Visit: Payer: Self-pay | Admitting: Internal Medicine

## 2016-08-24 DIAGNOSIS — Z794 Long term (current) use of insulin: Principal | ICD-10-CM

## 2016-08-24 DIAGNOSIS — E113599 Type 2 diabetes mellitus with proliferative diabetic retinopathy without macular edema, unspecified eye: Secondary | ICD-10-CM

## 2016-08-25 ENCOUNTER — Ambulatory Visit: Payer: Medicare Other | Admitting: Cardiology

## 2016-08-25 NOTE — Progress Notes (Signed)
CC: neuropathy, diabetes  HPI:  [name] is a 66 y.o. with a PMH of CAD, GERD, HLD, HTN, T2DM with neuropathy presenting to clinic for neuropathy and follow up on her diabetes management.  T2DM: At last visit, patient was changed from humulin mix to lantus 40 units qhs and novolog 10 units with breakfast and lunch; she was continued on metformin 1000mg  daily (due to GI upset) and empaglaflozin 10mg  daily. Since then, her lantus has been increased to 50 units qhs. Patient states that her sugars have been much better controlled; her meter shows mostly AM readings and are over the last 2-3 weeks in the range of 90-120. Patient endorses feeling hypoglycemic if she waits to eat lunch, however does not check her sugars then. She denies polyphagia or polyuria. She denies nausea, vomiting, blurry vision.   Neuropathy: Patient states she is taking gabapentin 300mg  BID with inadequate control of her neuropathy. She is hesitant to increase the dose or frequency at this time.  Foot pain: Patient has a bone spur in forefoot which has been giving her pain; Dr. Ardelle Anton with podiatry recommended use of a gel pad underneath this area which has been significantly helping control the pain though it is still present.   HLD: Patient recently started on pravastatin 10mg  daily; in the past she has had myalgias with atorvastatin, rosuvastatin and 40mg  pravastatin. She denies myalgias currently.  Please see problem based Assessment and Plan for status of patients chronic conditions.  Past Medical History:  Diagnosis Date  . Coronary atherosclerosis 04/13/2008   cardiac catheterization in 2001 (due to dyspnea on exertion) that showed small vessel coronary disease; Myoview 2010 probable soft tissue attenuation but overall normal perfusion and normal LV function. An echocardiogram 2004 revealed normal LV function, mild left atrial enlargement and mild mitral regurgitation. Medical management.  Marland Kitchen GERD  (gastroesophageal reflux disease)   . History of cataract surgery 03/01/2015  . Hyperlipidemia 04/13/2008   Declines statins - myalgias  . Hypertension   . Nail avulsion of toe 11/26/2015   right great toe - nontraumatic  . Parkinson's disease (HCC)    questionable; patient endorses diagnosis; no symptoms  . Type 2 diabetes mellitus with proliferative retinopathy without macular edema (HCC) 04/13/2008   [facility]: Severe proliferative retinopathy with clinically significant macular edema, stable vitreous hemorrhage of left eye, and stable age-related nuclear cataract of both eyes.  08/09/14: [facility]: Bilateral rapidly progressive diabetic retinopathy with no macular edema     Review of Systems:   Review of Systems  Eyes: Negative for blurred vision and double vision.  Respiratory: Negative for shortness of breath.   Cardiovascular: Negative for leg swelling.  Gastrointestinal: Negative for abdominal pain, constipation, diarrhea, nausea and vomiting.  Genitourinary: Negative for frequency.  Musculoskeletal: Negative for myalgias.       Toe pain  Neurological: Negative for weakness.       Burning in her feet bil  Endo/Heme/Allergies: Negative for polydipsia.    Physical Exam:  Vitals:   08/26/16 1605 08/26/16 1606  BP: (!) 152/68 (!) 149/71  Pulse: 70 66  Temp: 97.8 F (36.6 C)   TempSrc: Oral   SpO2: 98%   Weight: 191 lb 14.4 oz (87 kg)   Height: 5\' 1"  (1.549 m)    Physical Exam  Constitutional: She is oriented to person, place, and time. She appears well-developed and well-nourished. No distress.  HENT:  Head: Normocephalic and atraumatic.  Eyes: EOM are normal.  Cardiovascular: Normal rate, regular rhythm, normal heart sounds and intact distal pulses.   Pulmonary/Chest: Effort normal and breath sounds normal. She has no wheezes. She has no rales.  Abdominal: Soft. Bowel sounds are normal. There is no tenderness.    Musculoskeletal: Normal range of motion. She exhibits edema (minimal).  Neurological: She is alert and oriented to person, place, and time.  Skin: Skin is warm and dry. She is not diaphoretic.  Psychiatric: She has a normal mood and affect. Her behavior is normal. Judgment and thought content normal.    Assessment & Plan:   See Encounters Tab for problem based charting.   Patient discussed with Dr. Fredrich Romans, MD Internal Medicine PGY2

## 2016-08-26 ENCOUNTER — Encounter: Payer: Self-pay | Admitting: Internal Medicine

## 2016-08-26 ENCOUNTER — Ambulatory Visit (INDEPENDENT_AMBULATORY_CARE_PROVIDER_SITE_OTHER): Payer: Medicare Other | Admitting: Internal Medicine

## 2016-08-26 VITALS — BP 149/71 | HR 66 | Temp 97.8°F | Ht 61.0 in | Wt 191.9 lb

## 2016-08-26 DIAGNOSIS — G8929 Other chronic pain: Secondary | ICD-10-CM | POA: Diagnosis not present

## 2016-08-26 DIAGNOSIS — E1142 Type 2 diabetes mellitus with diabetic polyneuropathy: Secondary | ICD-10-CM | POA: Diagnosis not present

## 2016-08-26 DIAGNOSIS — K219 Gastro-esophageal reflux disease without esophagitis: Secondary | ICD-10-CM | POA: Diagnosis not present

## 2016-08-26 DIAGNOSIS — I2510 Atherosclerotic heart disease of native coronary artery without angina pectoris: Secondary | ICD-10-CM

## 2016-08-26 DIAGNOSIS — I10 Essential (primary) hypertension: Secondary | ICD-10-CM | POA: Diagnosis not present

## 2016-08-26 DIAGNOSIS — Z79899 Other long term (current) drug therapy: Secondary | ICD-10-CM

## 2016-08-26 DIAGNOSIS — Z7982 Long term (current) use of aspirin: Secondary | ICD-10-CM | POA: Diagnosis not present

## 2016-08-26 DIAGNOSIS — Z794 Long term (current) use of insulin: Secondary | ICD-10-CM | POA: Diagnosis not present

## 2016-08-26 DIAGNOSIS — E113599 Type 2 diabetes mellitus with proliferative diabetic retinopathy without macular edema, unspecified eye: Secondary | ICD-10-CM

## 2016-08-26 DIAGNOSIS — M7731 Calcaneal spur, right foot: Secondary | ICD-10-CM

## 2016-08-26 DIAGNOSIS — R609 Edema, unspecified: Secondary | ICD-10-CM | POA: Diagnosis not present

## 2016-08-26 DIAGNOSIS — E785 Hyperlipidemia, unspecified: Secondary | ICD-10-CM | POA: Diagnosis not present

## 2016-08-26 DIAGNOSIS — M79674 Pain in right toe(s): Secondary | ICD-10-CM

## 2016-08-26 DIAGNOSIS — F17220 Nicotine dependence, chewing tobacco, uncomplicated: Secondary | ICD-10-CM

## 2016-08-26 DIAGNOSIS — E782 Mixed hyperlipidemia: Secondary | ICD-10-CM

## 2016-08-26 NOTE — Assessment & Plan Note (Signed)
BP elevated today, though she is usually well controlled on lisinopril 20mg  daily and metoprolol 100mg  daily. She endorses taking her medications today. She is asymptomatic.   Plan: --asked patient to check her BP at home a couple of times a week and bring in log --will not make any medication changes at this time due to single occurrence and previously well controlled state --will f/u in 2 month

## 2016-08-26 NOTE — Assessment & Plan Note (Signed)
At last visit, patient was changed from humulin mix to lantus 40 units qhs and novolog 10 units with breakfast and lunch; she was continued on metformin 1000mg  daily (due to GI upset) and empaglaflozin 10mg  daily. Since then, her lantus has been increased to 50 units qhs. Patient states that her sugars have been much better controlled; her meter shows mostly AM readings and are over the last 2-3 weeks in the range of 90-120. Patient endorses feeling hypoglycemic if she waits to eat lunch, however does not check her sugars then. She denies polyphagia or polyuria. She denies nausea, vomiting, blurry vision.  Plan: --continue lantus 50 units qhs, novolog 10 units BID wc --continue metformin 1000mg  daily - may switch in future to ER formulation due to GI symptoms on higher doses --continue empagliflozin 10mg  daily --f/u in 2 months for A1c, asked to bring in meter and check sugars fasting and 2-3 times per day

## 2016-08-26 NOTE — Assessment & Plan Note (Signed)
Patient states she is taking gabapentin 300mg  BID with inadequate control of her neuropathy. She is hesitant to increase the dose or frequency at this time.  Plan: --will continue monitoring, there is room to increase gabapentin in the future if patient interested.

## 2016-08-26 NOTE — Patient Instructions (Addendum)
Please check your blood sugar first thing in the morning and a couple of times throughout the day (about 3 hours after eating), and bring in your meter to your next appointment.   I will see you in about 2 months to recheck your A1c; if you need [place] before then, please don't hesitate to call.

## 2016-08-26 NOTE — Assessment & Plan Note (Signed)
Patient recently started on pravastatin 10mg  daily; in the past she has had myalgias with atorvastatin, rosuvastatin and 40mg  pravastatin. She denies myalgias currently.  Plan: --continue pravastatin 10mg  daily; will discuss increasing to 20mg  daily at next appointment.

## 2016-08-26 NOTE — Assessment & Plan Note (Signed)
Patient has a bone spur in forefoot which has been giving her pain; Dr. Ardelle Anton with podiatry recommended use of a gel pad underneath this area which has been significantly helping control the pain though it is still present.   Plan: --patient advised on using cushioned shoes as much as possible; advised she may consider using custom orthotics through podiatry in future if she chooses --will continue to monitor

## 2016-08-28 ENCOUNTER — Other Ambulatory Visit: Payer: Self-pay | Admitting: *Deleted

## 2016-08-28 MED ORDER — GLUCOSE BLOOD VI STRP: ORAL_STRIP | 5 refills | Status: DC

## 2016-09-01 ENCOUNTER — Other Ambulatory Visit: Payer: Self-pay | Admitting: *Deleted

## 2016-09-01 MED ORDER — GLUCOSE BLOOD VI STRP: ORAL_STRIP | 4 refills | Status: DC

## 2016-09-01 NOTE — Progress Notes (Signed)
Internal Medicine Clinic Attending  Case discussed with Dr. Svalina  at the time of the visit.  We reviewed the resident's history and exam and pertinent patient test results.  I agree with the assessment, diagnosis, and plan of care documented in the resident's note.  

## 2016-09-02 ENCOUNTER — Other Ambulatory Visit: Payer: Self-pay | Admitting: *Deleted

## 2016-09-02 MED ORDER — ACCU-CHEK FASTCLIX LANCETS MISC: 4 refills | Status: DC

## 2016-09-02 MED ORDER — GLUCOSE BLOOD VI STRP: ORAL_STRIP | 4 refills | Status: DC

## 2016-09-17 ENCOUNTER — Telehealth: Payer: Self-pay | Admitting: *Deleted

## 2016-09-17 NOTE — Telephone Encounter (Signed)
Geriatrics Task Force - called pt; no answer - also no VM, unable to leave message. Will call back later.

## 2016-09-23 ENCOUNTER — Other Ambulatory Visit: Payer: Self-pay | Admitting: *Deleted

## 2016-09-23 ENCOUNTER — Ambulatory Visit: Payer: Medicare Other | Admitting: Cardiology

## 2016-09-23 ENCOUNTER — Ambulatory Visit (INDEPENDENT_AMBULATORY_CARE_PROVIDER_SITE_OTHER): Payer: Medicare Other | Admitting: Cardiovascular Disease

## 2016-09-23 ENCOUNTER — Encounter: Payer: Self-pay | Admitting: Cardiovascular Disease

## 2016-09-23 VITALS — BP 159/80 | HR 70 | Ht 62.0 in | Wt 190.0 lb

## 2016-09-23 DIAGNOSIS — R202 Paresthesia of skin: Secondary | ICD-10-CM

## 2016-09-23 DIAGNOSIS — I2510 Atherosclerotic heart disease of native coronary artery without angina pectoris: Secondary | ICD-10-CM

## 2016-09-23 DIAGNOSIS — R200 Anesthesia of skin: Secondary | ICD-10-CM

## 2016-09-23 DIAGNOSIS — G8929 Other chronic pain: Secondary | ICD-10-CM

## 2016-09-23 DIAGNOSIS — R072 Precordial pain: Secondary | ICD-10-CM | POA: Diagnosis not present

## 2016-09-23 DIAGNOSIS — E782 Mixed hyperlipidemia: Secondary | ICD-10-CM

## 2016-09-23 DIAGNOSIS — M79674 Pain in right toe(s): Secondary | ICD-10-CM | POA: Diagnosis not present

## 2016-09-23 MED ORDER — PRAVASTATIN SODIUM 10 MG PO TABS: 10.0000 mg | ORAL_TABLET | Freq: Every evening | ORAL | 0 refills | Status: DC

## 2016-09-23 NOTE — Patient Instructions (Signed)
Medication Instructions:   NO CHANGE  Testing/Procedures:  Your physician has requested that you have a lower extremity arterial duplex. During this test, ultrasound are used to evaluate arterial blood flow in the legs. Allow one hour for this exam. There are no restrictions or special instructions.   Your physician has requested that you have a lexiscan myoview. For further information please visit [email or web address] Please follow instruction sheet, as given.   Your physician has requested that you have an echocardiogram. Echocardiography is a painless test that uses sound waves to create images of your heart. It provides your doctor with information about the size and shape of your heart and how well your heart's chambers and valves are working. This procedure takes approximately one hour. There are no restrictions for this procedure.   Follow-Up:  Your physician recommends that you schedule a follow-up appointment AFTER TESTING COMPLETE WITH DR Allyson Sabal

## 2016-09-23 NOTE — Assessment & Plan Note (Signed)
History of normal cath in 2001 done for dyspnea. Myoview 2010 was nonischemic. Since I saw her 3 months ago she developed exertional angina associated with shortness of breath. I'm going to get a form quadrant Myoview stress test to further evaluate as well as a 2-D echocardiogram.

## 2016-09-23 NOTE — Assessment & Plan Note (Signed)
History of foot pain with ABIs that were elevated because of noncompressibility. Lower extremity until Doppler studies to further evaluate.

## 2016-09-23 NOTE — Progress Notes (Signed)
09/23/2016 Destiny Russell   1950-11-17  [id number]  Primary Physician Alphonzo Grieve, MD Primary Cardiologist: Lorretta Harp MD Lupe Carney, Faelynn  HPI:  Destiny M Mazurowski is a 66 y.o. female thin appearing married Caucasian female mother of 66, grandmother of 3 grandchildren referred by Dr. Earleen Newport, her podiatrist, for peripheral vascular evaluation because of numbness in her feet. She also has a painful right great toe. I last saw her in the office 06/04/16. She is a retired Oncologist. She is never smoked. She does have history of hypertension and diabetes. She is never had a heart attack or stroke. She does complain of shortness of breath. She has had a heart catheterization back in 2001 and was found to have "small vessel disease". She complains of numbness in her feet but denies claudication. Dopplers performed 05/24/16 revealed normal ABIs although the left ABI could not be accurately determined because of incompressibility. Since I saw her 3 months ago she's developed new onset exertional chest pain into her jaw along with shortness of breath and she continues to have the pain as well.    Current Meds  Medication Sig  . ACCU-CHEK FASTCLIX LANCETS MISC USE TO CHECK BLOOD SUGAR 3 TO 4 TIMES DAILY. Diagnosis code E11.3599. Insulin dependent  . aspirin EC 81 MG tablet Take 81 mg by mouth daily.  . Blood Glucose Monitoring Suppl (ACCU-CHEK NANO SMARTVIEW) W/DEVICE KIT Use to test blood glucose 3 times daily. Dx:250.00  . cetirizine (ZYRTEC) 10 MG tablet TAKE ONE TABLET BY MOUTH ONCE DAILY  . gabapentin (NEURONTIN) 300 MG capsule Take 1 capsule (300 mg total) by mouth at bedtime.  Marland Kitchen glucose blood (ACCU-CHEK SMARTVIEW) test strip Use to test blood sugar 3 times daily. diag code E11.3599. Insulin dependent  . Insulin Glargine (LANTUS) 100 UNIT/ML Solostar Pen Inject 40 Units into the skin daily at 10 pm.  . insulin lispro (HUMALOG) 100 UNIT/ML KiwkPen Inject 0.1 mLs (10 Units  total) into the skin 2 (two) times daily with a meal.  . JARDIANCE 10 MG TABS tablet TAKE 1 TABLET BY MOUTH DAILY  . lisinopril (PRINIVIL,ZESTRIL) 20 MG tablet Take 1 tablet (20 mg total) by mouth daily.  . metFORMIN (GLUCOPHAGE) 1000 MG tablet TAKE ONE TABLET BY MOUTH TWICE DAILY WITH A MEAL  . metoprolol succinate (TOPROL-XL) 100 MG 24 hr tablet TAKE ONE TABLET BY MOUTH ONCE DAILY WITH OR  IMMEDIATELY FOLLOWING A MEAL.  Marland Kitchen polyethylene glycol (MIRALAX / GLYCOLAX) packet Take 17 g by mouth daily.  . pravastatin (PRAVACHOL) 10 MG tablet Take 1 tablet (10 mg total) by mouth every evening.  Marland Kitchen RELION PEN NEEDLES 32G X 4 MM MISC USE TO INJECT INSULIN 4 TIMES A DAY     Allergies  Allergen Reactions  . Morphine     REACTION: Reaction not known  . Norvasc [Amlodipine]     Made her sick  . Olmesartan     nausea    Social History   Social History  . Marital status: Married    Spouse name: N/A  . Number of children: N/A  . Years of education: 9th   Occupational History  . Not on file.   Social History Main Topics  . Smoking status: Never Smoker  . Smokeless tobacco: Current User    Types: Snuff, Chew  . Alcohol use No  . Drug use: No  . Sexual activity: Not on file   Other Topics Concern  . Not on file  Social History Narrative  . No narrative on file     Review of Systems: General: negative for chills, fever, night sweats or weight changes.  Cardiovascular: negative for chest pain, dyspnea on exertion, edema, orthopnea, palpitations, paroxysmal nocturnal dyspnea or shortness of breath Dermatological: negative for rash Respiratory: negative for cough or wheezing Urologic: negative for hematuria Abdominal: negative for nausea, vomiting, diarrhea, bright red blood per rectum, melena, or hematemesis Neurologic: negative for visual changes, syncope, or dizziness All other systems reviewed and are otherwise negative except as noted above.    Blood pressure (!) 159/80, pulse  70, height _0  (1.575 m), weight 190 lb (86.2 kg), SpO2 94 %.  General appearance: alert and no distress Neck: no adenopathy, no carotid bruit, no JVD, supple, symmetrical, trachea midline and thyroid not enlarged, symmetric, no tenderness/mass/nodules Lungs: clear to auscultation bilaterally Heart: regular rate and rhythm, S1, S2 normal, no murmur, click, rub or gallop Extremities: extremities normal, atraumatic, no cyanosis or edema Pulses: 2+ and symmetric Skin: Skin color, texture, turgor normal. No rashes or lesions Neurologic: Alert and oriented X 3, normal strength and tone. Normal symmetric reflexes. Normal coordination and gait  EKG not performed today  ASSESSMENT AND PLAN:   Coronary atherosclerosis History of normal cath in 2001 done for dyspnea. Myoview 2010 was nonischemic. Since I saw her 3 months ago she developed exertional angina associated with shortness of breath. I'm going to get a form quadrant Myoview stress test to further evaluate as well as a 2-D echocardiogram.  Toe pain, chronic, right History of foot pain with ABIs that were elevated because of noncompressibility. Lower extremity until Doppler studies to further evaluate.      Lorretta Harp MD FACP,FACC,FAHA, [facility] 09/23/2016 1:38 PM

## 2016-10-01 ENCOUNTER — Other Ambulatory Visit: Payer: Self-pay

## 2016-10-01 ENCOUNTER — Ambulatory Visit (HOSPITAL_COMMUNITY): Payer: Medicare Other | Attending: Cardiology

## 2016-10-01 DIAGNOSIS — R0600 Dyspnea, unspecified: Secondary | ICD-10-CM | POA: Diagnosis not present

## 2016-10-01 DIAGNOSIS — E119 Type 2 diabetes mellitus without complications: Secondary | ICD-10-CM | POA: Diagnosis not present

## 2016-10-01 DIAGNOSIS — I080 Rheumatic disorders of both mitral and aortic valves: Secondary | ICD-10-CM | POA: Insufficient documentation

## 2016-10-01 DIAGNOSIS — I10 Essential (primary) hypertension: Secondary | ICD-10-CM | POA: Insufficient documentation

## 2016-10-01 DIAGNOSIS — R072 Precordial pain: Secondary | ICD-10-CM | POA: Diagnosis not present

## 2016-10-01 NOTE — Progress Notes (Signed)
Echo results: Good news: Essentially normal echocardiogram and normal pump function and normal valve function.  EF is in the upper limit of normal 65-70% range.  No regional wall motion abnormalities - means No signs to suggest heart attack. Mildly calcified/thickened aortic valve with no sign of stenosis. (Sclerosis no stenosis). Otherwise minimal valve disease.  Bryan Lemma, MD

## 2016-10-05 ENCOUNTER — Telehealth: Payer: Self-pay | Admitting: *Deleted

## 2016-10-05 NOTE — Telephone Encounter (Signed)
Left message to call back for results  Test order by Dr Allyson Sabal. FOLLOW UP WITH BERRY ON  11/06/16

## 2016-10-05 NOTE — Telephone Encounter (Signed)
-----   Message from Marykay Lex, MD sent at 10/01/2016  5:18 PM EDT ----- Echo results: Good news: Essentially normal echocardiogram and normal pump function and normal valve function.  EF is in the upper limit of normal 65-70% range.  No regional wall motion abnormalities - means No signs to suggest heart attack. Mildly calcified/thickened aortic valve with no sign of stenosis. (Sclerosis no stenosis). Otherwise minimal valve disease.  Bryan Lemma, MD

## 2016-10-12 DIAGNOSIS — E113592 Type 2 diabetes mellitus with proliferative diabetic retinopathy without macular edema, left eye: Secondary | ICD-10-CM | POA: Diagnosis not present

## 2016-10-12 DIAGNOSIS — H211X2 Other vascular disorders of iris and ciliary body, left eye: Secondary | ICD-10-CM | POA: Diagnosis not present

## 2016-10-12 DIAGNOSIS — E113551 Type 2 diabetes mellitus with stable proliferative diabetic retinopathy, right eye: Secondary | ICD-10-CM | POA: Diagnosis not present

## 2016-10-12 DIAGNOSIS — E113511 Type 2 diabetes mellitus with proliferative diabetic retinopathy with macular edema, right eye: Secondary | ICD-10-CM | POA: Diagnosis not present

## 2016-10-12 DIAGNOSIS — H43812 Vitreous degeneration, left eye: Secondary | ICD-10-CM | POA: Diagnosis not present

## 2016-10-14 ENCOUNTER — Telehealth (HOSPITAL_COMMUNITY): Payer: Self-pay

## 2016-10-14 NOTE — Telephone Encounter (Signed)
Encounter complete. 

## 2016-10-15 ENCOUNTER — Ambulatory Visit (HOSPITAL_COMMUNITY)
Admission: RE | Admit: 2016-10-15 | Discharge: 2016-10-15 | Disposition: A | Discharge status: Home / Self Care | Payer: Medicare Other | Source: Ambulatory Visit | Attending: Cardiovascular Disease | Admitting: Cardiovascular Disease

## 2016-10-15 DIAGNOSIS — E1142 Type 2 diabetes mellitus with diabetic polyneuropathy: Secondary | ICD-10-CM

## 2016-10-15 DIAGNOSIS — R200 Anesthesia of skin: Secondary | ICD-10-CM | POA: Diagnosis not present

## 2016-10-15 DIAGNOSIS — R202 Paresthesia of skin: Secondary | ICD-10-CM

## 2016-10-15 DIAGNOSIS — R9439 Abnormal result of other cardiovascular function study: Secondary | ICD-10-CM | POA: Diagnosis not present

## 2016-10-15 DIAGNOSIS — R072 Precordial pain: Secondary | ICD-10-CM | POA: Diagnosis not present

## 2016-10-15 LAB — MYOCARDIAL PERFUSION IMAGING
CHL CUP RESTING HR STRESS: 62 {beats}/min
CSEPPHR: 83 {beats}/min
LVDIAVOL: 90 mL (ref 46–106)
LVSYSVOL: 40 mL
SDS: 4
SRS: 0
SSS: 4
TID: 1.51

## 2016-10-15 MED ORDER — REGADENOSON 0.4 MG/5ML IV SOLN
0.4000 mg | Freq: Once | INTRAVENOUS | Status: AC
  Administered 2016-10-15: 0.4 mg via INTRAVENOUS

## 2016-10-15 MED ORDER — TECHNETIUM TC 99M TETROFOSMIN IV KIT
30.1000 | PACK | Freq: Once | INTRAVENOUS | Status: AC | PRN
  Administered 2016-10-15: 30.1 via INTRAVENOUS
  Filled 2016-10-15: qty 31

## 2016-10-15 MED ORDER — TECHNETIUM TC 99M TETROFOSMIN IV KIT
11.0000 | PACK | Freq: Once | INTRAVENOUS | Status: AC | PRN
  Administered 2016-10-15: 11 via INTRAVENOUS
  Filled 2016-10-15: qty 11

## 2016-10-16 NOTE — Telephone Encounter (Signed)
Destiny Russell is a 66 y.o. female who was contacted on behalf of [facility] Geriatrics Task Force.   Diabetes Assessment  DM meds and BS checks -  "What medications are you taking for diabetes?"  Pt able to states medications. -  "How often do you check your blood sugars at home?" three times a day usually;states they're up and down. - "What have your blood sugars been?" States BS are in the 150's.  High Blood Pressure Assessment  BP meds & BP checks -  "What medications are you taking for high blood pressure?" pt able to state meds. - "How often do you check your blood pressure at home?" whenever she can. - "What have your blood pressure readings been?" Yesterday it was 156/80.  Coping with DM and BP - What else are you doing to help with your DM and BP - Diet? States she tries. -  Exercise? Trying to exercise when she can;depends on the weather and does not like to walk alone.   Medication Access Issues  Medication Issues? -  "Are you getting your medicines refilled on time without skipping any doses?"  States no problem obtaining medications. - "Are you having any problems getting/taking your meds (cost, timing, transportation)?" No - Do you need any meds refilled? No, not at this time.  Conclusion  Close the call - Date of follow-up visit scheduled*Appt scheduled 10/31-pt aware. - "Any other questions or concerns?" States her legs have been swelling mainly after standing long pds which this has not happened until she was taken off fluid pill (combo-pill). But they do go down after keeping them elevated.  Thanked pt for speaking with me today.

## 2016-11-04 ENCOUNTER — Ambulatory Visit (INDEPENDENT_AMBULATORY_CARE_PROVIDER_SITE_OTHER): Payer: Medicare Other | Admitting: Internal Medicine

## 2016-11-04 ENCOUNTER — Encounter: Payer: Self-pay | Admitting: Internal Medicine

## 2016-11-04 VITALS — BP 140/62 | HR 70 | Temp 98.2°F | Ht 61.0 in | Wt 194.4 lb

## 2016-11-04 DIAGNOSIS — Z0000 Encounter for general adult medical examination without abnormal findings: Secondary | ICD-10-CM

## 2016-11-04 DIAGNOSIS — Z79899 Other long term (current) drug therapy: Secondary | ICD-10-CM | POA: Diagnosis not present

## 2016-11-04 DIAGNOSIS — J309 Allergic rhinitis, unspecified: Secondary | ICD-10-CM

## 2016-11-04 DIAGNOSIS — Z87440 Personal history of urinary (tract) infections: Secondary | ICD-10-CM | POA: Diagnosis not present

## 2016-11-04 DIAGNOSIS — K219 Gastro-esophageal reflux disease without esophagitis: Secondary | ICD-10-CM | POA: Diagnosis not present

## 2016-11-04 DIAGNOSIS — F17220 Nicotine dependence, chewing tobacco, uncomplicated: Secondary | ICD-10-CM | POA: Diagnosis not present

## 2016-11-04 DIAGNOSIS — Z23 Encounter for immunization: Secondary | ICD-10-CM | POA: Diagnosis present

## 2016-11-04 DIAGNOSIS — Z794 Long term (current) use of insulin: Secondary | ICD-10-CM

## 2016-11-04 DIAGNOSIS — E785 Hyperlipidemia, unspecified: Secondary | ICD-10-CM

## 2016-11-04 DIAGNOSIS — E782 Mixed hyperlipidemia: Secondary | ICD-10-CM

## 2016-11-04 DIAGNOSIS — E1140 Type 2 diabetes mellitus with diabetic neuropathy, unspecified: Secondary | ICD-10-CM

## 2016-11-04 DIAGNOSIS — I340 Nonrheumatic mitral (valve) insufficiency: Secondary | ICD-10-CM | POA: Diagnosis not present

## 2016-11-04 DIAGNOSIS — I10 Essential (primary) hypertension: Secondary | ICD-10-CM

## 2016-11-04 DIAGNOSIS — Z7982 Long term (current) use of aspirin: Secondary | ICD-10-CM

## 2016-11-04 DIAGNOSIS — E113599 Type 2 diabetes mellitus with proliferative diabetic retinopathy without macular edema, unspecified eye: Secondary | ICD-10-CM

## 2016-11-04 DIAGNOSIS — I2510 Atherosclerotic heart disease of native coronary artery without angina pectoris: Secondary | ICD-10-CM

## 2016-11-04 LAB — GLUCOSE, CAPILLARY: GLUCOSE-CAPILLARY: 115 mg/dL — AB (ref 65–99)

## 2016-11-04 LAB — POCT GLYCOSYLATED HEMOGLOBIN (HGB A1C): HEMOGLOBIN A1C: 7.4

## 2016-11-04 MED ORDER — INSULIN GLARGINE 100 UNIT/ML SOLOSTAR PEN: 40.0000 [IU] | PEN_INJECTOR | Freq: Every day | SUBCUTANEOUS | 2 refills | Status: DC

## 2016-11-04 MED ORDER — LISINOPRIL 20 MG PO TABS: 20.0000 mg | ORAL_TABLET | Freq: Every day | ORAL | 3 refills | Status: DC

## 2016-11-04 MED ORDER — INSULIN LISPRO 100 UNIT/ML (KWIKPEN)
10.0000 [IU] | PEN_INJECTOR | Freq: Two times a day (BID) | SUBCUTANEOUS | 3 refills | Status: DC
Start: 2016-11-04 — End: 2017-08-04

## 2016-11-04 MED ORDER — CETIRIZINE HCL 10 MG PO TABS: 10.0000 mg | ORAL_TABLET | Freq: Every day | ORAL | 3 refills | Status: DC

## 2016-11-04 MED ORDER — GABAPENTIN 300 MG PO CAPS: 300.0000 mg | ORAL_CAPSULE | Freq: Every day | ORAL | 3 refills | Status: DC

## 2016-11-04 MED ORDER — PRAVASTATIN SODIUM 10 MG PO TABS: 10.0000 mg | ORAL_TABLET | Freq: Every evening | ORAL | 0 refills | Status: DC

## 2016-11-04 MED ORDER — METOPROLOL SUCCINATE ER 100 MG PO TB24: ORAL_TABLET | ORAL | 3 refills | Status: DC

## 2016-11-04 MED ORDER — METFORMIN HCL 1000 MG PO TABS: 1000.0000 mg | ORAL_TABLET | Freq: Every day | ORAL | 3 refills | Status: DC

## 2016-11-04 NOTE — Progress Notes (Signed)
   CC: diabetes  HPI:  [name] is a 66 y.o. with a PMH of small vessel CAD, GERD, HLD, HTN, T2DM with neuropathy presenting to clinic for follow up on her diabetes.  T2DM: Three months ago, patient switched insulin therapies from humulin mix to Lantus and humalog - current dosing is lantus 50 units qhs, and humalog 10 units BID with meals; she has also continued on empagliflozin 10mg  daily and metformin 1000mg  daily. Patient forgot her meter today, however reports AM CBGs in 100-130 range, and PM CBGS in 150-180 range. She endorses compliance with the mealtime insulin and reaffirms she eats mainly 2 meals a day, but does endorse "grazing" throughout the day. She denies polyuria, polydipsia, nausea, vomiting, vision changes. She endorses only sporadic feeling of hypoglycemia (does not check CBG) when she waits too long to eat lunch; she addresses this with small snack. She does endorse frequent UTIs over the last year that she self treats with OTC meds which do help.  HLD:  Patient with HLD and >ASCVD risk score who should be on high intensity statin, however developed statin myopathy on pravastatin 40mg , rosuvastatin, and atorvastatin 40mg . We had restarted low dose pravastatin 10mg  daily which she was tolerating well for a few weeks, however she developed leg muscle aches a couple of weeks ago that were not associated with increased activity and resolved after she stopped taking pravastatin.   Please see problem based Assessment and Plan for status of patients chronic conditions.  Past Medical History:  Diagnosis Date  . Coronary atherosclerosis 04/13/2008   cardiac catheterization in 2001 (due to dyspnea on exertion) that showed small vessel coronary disease; Myoview 2010 probable soft tissue attenuation but overall normal perfusion and normal LV function. An echocardiogram 2004 revealed normal LV function, mild left atrial enlargement and mild mitral regurgitation. Medical management.    Marland Kitchen. GERD (gastroesophageal reflux disease)   . History of cataract surgery 03/01/2015  . Hyperlipidemia 04/13/2008   Declines statins - myalgias  . Hypertension   . Nail avulsion of toe 11/26/2015   right great toe - nontraumatic  . Parkinson's disease (HCC)    questionable; patient endorses diagnosis; no symptoms  . Type 2 diabetes mellitus with proliferative retinopathy without macular edema (HCC) 04/13/2008   [facility]14 [facility]: Severe proliferative retinopathy with clinically significant macular edema, stable vitreous hemorrhage of left eye, and stable age-related nuclear cataract of both eyes.  08/09/14: [facility]: Bilateral rapidly progressive diabetic retinopathy with no macular edema     Review of Systems:   ROS per HPi  Physical Exam:  Vitals:   11/04/16 1553  BP: 140/62  Pulse: 70  Temp: 98.2 F (36.8 C)  TempSrc: Oral  SpO2: 96%  Weight: 194 lb 6.4 oz (88.2 kg)  Height: 5\' 1"  (1.549 m)   GENERAL- alert, co-operative, appears as stated age, not in any distress. HEENT- Atraumatic, normocephalic, EOMI, oral mucosa appears moist CARDIAC- RRR, no murmurs, rubs or gallops. RESP- Moving equal volumes of air, and clear to auscultation bilaterally, no wheezes or crackles. ABDOMEN- Soft, nontender, bowel sounds present. NEURO- No obvious Cr N abnormality. EXTREMITIES- pulse 1+ PT bilaterally, no pedal edema. SKIN- Warm, dry, no rash or lesion. PSYCH- Normal mood and affect, appropriate thought content and speech.  Assessment & Plan:   See Encounters Tab for problem based charting.   Patient discussed with Dr. Valla LeaverButcher   Destiny Ida, MD Internal Medicine PGY2

## 2016-11-04 NOTE — Patient Instructions (Addendum)
Your A1c was 7.4! Great job!!  Continue your Lantus 40 units at night and Novolog 10 units twice daily with meals. Continue your metformin 1000mg  once daily with a meal. Stop the empagliflozin (Jardiance) as this may be causing your urinary infections.  You can start taking pravastatin once a week and work up to once every other day depending on your symptoms of muscle aches.  Please come back in 1 month with your glucose meter to make sure your sugars are still doing well after stopping the empagliflozin.

## 2016-11-06 ENCOUNTER — Encounter: Payer: Self-pay | Admitting: Cardiovascular Disease

## 2016-11-06 ENCOUNTER — Ambulatory Visit (INDEPENDENT_AMBULATORY_CARE_PROVIDER_SITE_OTHER): Payer: Medicare Other | Admitting: Cardiovascular Disease

## 2016-11-06 ENCOUNTER — Encounter: Payer: Self-pay | Admitting: Internal Medicine

## 2016-11-06 DIAGNOSIS — I2510 Atherosclerotic heart disease of native coronary artery without angina pectoris: Secondary | ICD-10-CM

## 2016-11-06 DIAGNOSIS — R079 Chest pain, unspecified: Secondary | ICD-10-CM | POA: Diagnosis not present

## 2016-11-06 NOTE — Progress Notes (Signed)
.   Destiny Russell returns today for follow-up of jaw pain. She had a recent Myoview stress test that was low risk and nonischemic. She gets jaw pain every several days lasting for seconds at a time. I'm not commences is ischemically mediated especially in light of her normal stress test and normal LV function. I will see her back as needed  Runell GessJonathan J. Brenden Rudman, M.D., FACP, [facility] CenterFACC, Kathryne ErikssonFAHA, FSCAI [facility] [address] [place], [place]  [id number]  [phone] 11/06/2016 12:11 PM

## 2016-11-06 NOTE — Assessment & Plan Note (Signed)
.   Ms Destiny Russell returns today for follow-up of jaw pain. She had a recent Myoview stress test that was low risk and nonischemic. She gets jaw pain every several days lasting for seconds at a time. I'm not commences is ischemically mediated especially in light of her normal stress test and normal LV function. I will see her back as needed.

## 2016-11-06 NOTE — Assessment & Plan Note (Signed)
Had cardiac eval due to continued mild chest pain with exertion. Echo showed preserved EF 65-70%, no regional wall motion abnormalities; has calcified MV with trivial regurg. Myoview was low risk study so no indication for repeat cath at this time. She will f/u with Dr. Allyson SabalBerry 11/2.

## 2016-11-06 NOTE — Assessment & Plan Note (Signed)
Reminded patient to schedule dexa and mammogram.

## 2016-11-06 NOTE — Assessment & Plan Note (Addendum)
BP stable.  Refills provided for lisinopril 20mg  daily and metoprolol 100mg  daily.

## 2016-11-06 NOTE — Assessment & Plan Note (Signed)
Three months ago, patient switched insulin therapies from humulin mix to Lantus and novolog - current dosing is lantus 50 units qhs, and novolog 10 units BID with meals; she has also continued on empagliflozin 10mg  daily and metformin 1000mg  daily. Patient forgot her meter today, however reports AM CBGs in 100-130 range, and PM CBGS in 150-180 range. She endorses compliance with the mealtime insulin and reaffirms she eats mainly 2 meals a day, but does endorse "grazing" throughout the day. She denies polyuria, polydipsia, nausea, vomiting, vision changes. She endorses only sporadic feeling of hypoglycemia (does not check CBG) when she waits too long to eat lunch; she addresses this with small snack. She does endorse frequent UTIs over the last year that she self treats with OTC meds which do help.  Plan: --A1c 7.4 today (down from 9.9) --continue Lantus 50 units qhs and humalog 10 units BID wc --continue metformin 1000mg  daily --stop empagliflozin in setting of recurrent UTIs --f/u in 1 months with glucometer to adjust insulin if necessary in setting of stopping empagliflozin

## 2016-11-06 NOTE — Assessment & Plan Note (Signed)
Patient with HLD and >ASCVD risk score who should be on high intensity statin, however developed statin myopathy on pravastatin 40mg , rosuvastatin, and atorvastatin 40mg . We had restarted low dose pravastatin 10mg  daily which she was tolerating well for a few weeks, however she developed leg muscle aches a couple of weeks ago that were not associated with increased activity and resolved after she stopped taking pravastatin.   Plan: --patient in agreement of starting pravastatin 10mg  qweekly then working up to every other day if tolerates

## 2016-11-06 NOTE — Patient Instructions (Signed)
Medication Instructions: Your physician recommends that you continue on your current medications as directed. Please refer to the Current Medication list given to you today.   Follow-Up: Your physician recommends that you schedule a follow-up appointment as needed with Dr. Berry.    

## 2016-11-09 NOTE — Telephone Encounter (Signed)
Results given at office appointment

## 2016-11-09 NOTE — Progress Notes (Signed)
Internal Medicine Clinic Attending  Case discussed with Dr. Svalina  at the time of the visit.  We reviewed the resident's history and exam and pertinent patient test results.  I agree with the assessment, diagnosis, and plan of care documented in the resident's note.  

## 2016-12-09 ENCOUNTER — Encounter: Payer: Medicare Other | Admitting: Internal Medicine

## 2016-12-09 ENCOUNTER — Encounter: Payer: Self-pay | Admitting: Internal Medicine

## 2016-12-09 NOTE — Progress Notes (Deleted)
   CC: ***  HPI:  Ms.Gussie M Vaughan Bastaegram is a 66 y.o. with a PMH of ***  Please see problem based Assessment and Plan for status of patients chronic conditions.  Past Medical History:  Diagnosis Date  . Coronary atherosclerosis 04/13/2008   cardiac catheterization in 2001 (due to dyspnea on exertion) that showed small vessel coronary disease; Myoview 2010 probable soft tissue attenuation but overall normal perfusion and normal LV function. An echocardiogram 2004 revealed normal LV function, mild left atrial enlargement and mild mitral regurgitation. Medical management.  Marland Kitchen. GERD (gastroesophageal reflux disease)   . History of cataract surgery 03/01/2015  . Hyperlipidemia 04/13/2008   Declines statins - myalgias  . Hypertension   . Nail avulsion of toe 11/26/2015   right great toe - nontraumatic  . Parkinson's disease (HCC)    questionable; patient endorses diagnosis; no symptoms  . Type 2 diabetes mellitus with proliferative retinopathy without macular edema (HCC) 04/13/2008   [facility]/14 [facility]: Severe proliferative retinopathy with clinically significant macular edema, stable vitreous hemorrhage of left eye, and stable age-related nuclear cataract of both eyes.  08/09/14: [facility]: Bilateral rapidly progressive diabetic retinopathy with no macular edema     Review of Systems:   ROS  Physical Exam:  There were no vitals filed for this visit. GENERAL- alert, co-operative, appears as stated age, not in any distress. HEENT- Atraumatic, normocephalic, PERRL, EOMI, oral mucosa appears moist CARDIAC- RRR, no murmurs, rubs or gallops. RESP- Moving equal volumes of air, and clear to auscultation bilaterally, no wheezes or crackles. ABDOMEN- Soft, nontender, bowel sounds present. NEURO- No obvious Cr N abnormality. EXTREMITIES- pulse 2+, symmetric, no pedal edema. SKIN- Warm, dry, No rash or lesion. PSYCH- Normal mood and affect, appropriate thought content  and speech.  Assessment & Plan:   See Encounters Tab for problem based charting.   Patient {GC/GE:[id number]::"discussed with","seen with"} Dr. {NAMES:[id number]::"Butcher","Granfortuna","E. Hoffman","Klima","Mullen","Narendra","Vincent"}   Nyra MarketGorica Abeer Iversen, MD Internal Medicine PGY2

## 2017-01-13 ENCOUNTER — Encounter: Payer: Self-pay | Admitting: Internal Medicine

## 2017-01-20 ENCOUNTER — Encounter: Payer: Self-pay | Admitting: Internal Medicine

## 2017-01-20 ENCOUNTER — Ambulatory Visit (INDEPENDENT_AMBULATORY_CARE_PROVIDER_SITE_OTHER): Payer: Medicare Other | Admitting: Internal Medicine

## 2017-01-20 VITALS — BP 144/78 | HR 63 | Temp 97.6°F | Ht 61.0 in | Wt 197.8 lb

## 2017-01-20 DIAGNOSIS — Z87440 Personal history of urinary (tract) infections: Secondary | ICD-10-CM | POA: Diagnosis not present

## 2017-01-20 DIAGNOSIS — N390 Urinary tract infection, site not specified: Secondary | ICD-10-CM | POA: Diagnosis not present

## 2017-01-20 DIAGNOSIS — K219 Gastro-esophageal reflux disease without esophagitis: Secondary | ICD-10-CM

## 2017-01-20 DIAGNOSIS — Z79899 Other long term (current) drug therapy: Secondary | ICD-10-CM

## 2017-01-20 DIAGNOSIS — Z794 Long term (current) use of insulin: Secondary | ICD-10-CM

## 2017-01-20 DIAGNOSIS — N3001 Acute cystitis with hematuria: Secondary | ICD-10-CM

## 2017-01-20 DIAGNOSIS — E113599 Type 2 diabetes mellitus with proliferative diabetic retinopathy without macular edema, unspecified eye: Secondary | ICD-10-CM

## 2017-01-20 DIAGNOSIS — F17220 Nicotine dependence, chewing tobacco, uncomplicated: Secondary | ICD-10-CM | POA: Diagnosis not present

## 2017-01-20 DIAGNOSIS — Z7982 Long term (current) use of aspirin: Secondary | ICD-10-CM | POA: Diagnosis not present

## 2017-01-20 DIAGNOSIS — I10 Essential (primary) hypertension: Secondary | ICD-10-CM | POA: Diagnosis not present

## 2017-01-20 DIAGNOSIS — E785 Hyperlipidemia, unspecified: Secondary | ICD-10-CM | POA: Diagnosis not present

## 2017-01-20 DIAGNOSIS — E1142 Type 2 diabetes mellitus with diabetic polyneuropathy: Secondary | ICD-10-CM

## 2017-01-20 LAB — POCT URINALYSIS DIPSTICK
BILIRUBIN UA: NEGATIVE
GLUCOSE UA: 100
Ketones, UA: NEGATIVE
Nitrite, UA: NEGATIVE
Spec Grav, UA: 1.02 (ref 1.010–1.025)
Urobilinogen, UA: 0.2 E.U./dL
pH, UA: 5 (ref 5.0–8.0)

## 2017-01-20 MED ORDER — LISINOPRIL 40 MG PO TABS: 40.0000 mg | ORAL_TABLET | Freq: Every day | ORAL | 3 refills | Status: DC

## 2017-01-20 MED ORDER — GABAPENTIN 300 MG PO CAPS: 300.0000 mg | ORAL_CAPSULE | Freq: Three times a day (TID) | ORAL | 1 refills | Status: AC

## 2017-01-20 MED ORDER — NITROFURANTOIN MONOHYD MACRO 100 MG PO CAPS: 100.0000 mg | ORAL_CAPSULE | Freq: Two times a day (BID) | ORAL | 0 refills | Status: AC

## 2017-01-20 NOTE — Patient Instructions (Signed)
FOLLOW-UP INSTRUCTIONS When: 2 weeks For: diabetes, blood pressure What to bring: glucose meter.   For your UTI, take macrobid 100mg  twice daily for 5 days.  For your blood pressure, we'll increase your lisinopril to 40mg  daily (can take 2 of the remaining pills at a time until you need a refill).  For your diabetes, check your blood sugar 3 times a day for the next 2 weeks and come back with your glucometer for further medication adjustment.

## 2017-01-20 NOTE — Progress Notes (Signed)
   CC: T2DM  HPI:  [name] is a 67 y.o. with a PMH of T2DM with peripheral neuropathy, HTN, HLD, GERD presenting to clinic for f/u on T2DM, HTN, and evaluation of dysuria.  T2DM: Last A1c was 7.4 in Nov 2018. At last visit, empagliflozin was stopped due to frequent UTIs. She continues to take Lantus 40 units qhs, Humalog 10 units BID with meals, and metformin 1000mg  daily. She endorses that her CBGs at home have been increasing. She denies polydipsia, polyuria, n/v, blurry vision. She does note continued bil LE neuropathy and states gabapentin does help; takes it BID. She denies increased somnolence or dizziness.  HTN: Patient on lisinopril 20mg  daily and metoprolol 100mg  daily. She denies headaches, vision or hearing changes, chest pain, shortness of breath, focal weakness.   Dysuria: Patient endorses 5 day h/o of dysuria, urgency, frequency and mild suprapubic pressure. She denies hematuria, vaginal discharge, flank pain, fevers, chills, N/V.  Please see problem based Assessment and Plan for status of patients chronic conditions.  Past Medical History:  Diagnosis Date  . Coronary atherosclerosis 04/13/2008   Cath in 2001 (for DOE) showed small vessel coronary disease; Myoview 2010 probable soft tissue attenuation but overall normal perfusion and normal LV function. An echocardiogram 2004 revealed normal LV function, mild LAE and mild MR. Myoview 2018 low risk.  Marland Kitchen. GERD (gastroesophageal reflux disease)   . History of cataract surgery 03/01/2015  . Hyperlipidemia 04/13/2008   Declines statins - myalgias  . Hypertension   . Nail avulsion of toe 11/26/2015   right great toe - nontraumatic  . Parkinson's disease (HCC)    questionable; patient endorses diagnosis; no symptoms  . Type 2 diabetes mellitus with proliferative retinopathy without macular edema (HCC) 04/13/2008   06/[facility]/14 [facility]: Severe proliferative retinopathy with clinically significant macular  edema, stable vitreous hemorrhage of left eye, and stable age-related nuclear cataract of both eyes.  08/09/14: [facility]: Bilateral rapidly progressive diabetic retinopathy with no macular edema     Review of Systems:   ROS Per HPI  Physical Exam:  Vitals:   01/20/17 1602 01/20/17 1650  BP: (!) 178/78 (!) 144/78  Pulse: 63   Temp: 97.6 F (36.4 C)   TempSrc: Oral   SpO2: 97%   Weight: 197 lb 12.8 oz (89.7 kg)   Height: 5\' 1"  (1.549 m)    GENERAL- alert, co-operative, appears as stated age, not in any distress. CARDIAC- RRR, no murmurs, rubs or gallops. RESP- Moving equal volumes of air, and clear to auscultation bilaterally, no wheezes or crackles. ABDOMEN- Soft, nontender. No CVA tenderness. NEURO- No obvious Cr N abnormality. EXTREMITIES- pulse 2+, symmetric, no pedal edema. SKIN- Warm, dry. PSYCH- Normal mood and affect, appropriate thought content and speech.  Assessment & Plan:   See Encounters Tab for problem based charting.   Patient discussed with Dr. Fredrich RomansMullen   Maelee Hoot, MD Internal Medicine PGY2

## 2017-01-21 ENCOUNTER — Encounter: Payer: Self-pay | Admitting: Internal Medicine

## 2017-01-21 NOTE — Assessment & Plan Note (Signed)
On gabapentin 300mg  BID without adequate control.  Plan: --patient in agreement to increase to TID dosing

## 2017-01-21 NOTE — Assessment & Plan Note (Signed)
Patient with symptoms of UTI w/o s/s of pyelonephritis.  Dipstick UA today with mod blood and small LE, nitrite neg  Plan: --macrobid 100mg  BIDx5 days --patient to return to if symptoms persist

## 2017-01-21 NOTE — Assessment & Plan Note (Signed)
Last A1c was 7.4 in Nov 2018. At last visit, empagliflozin was stopped due to frequent UTIs. She continues to take Lantus 40 units qhs, Humalog 10 units BID with meals, and metformin 1000mg  daily. She endorses that her CBGs at home have been increasing. She denies polydipsia, polyuria, n/v, blurry vision. She does note continued bil LE neuropathy and states gabapentin does help; takes it BID. She denies increased somnolence or dizziness.  Patient brings in meter with only a few readings all in 200s.  Plan: --continue lantus 40 units qhs --continue Humalog 10 units BID wc --continue metformin 1000mg  daily --asked patient to check CBGs 3-4 times per day for next 2 weeks and RTC with glucometer for medication adjustment; can consider starting sitagliptin

## 2017-01-21 NOTE — Assessment & Plan Note (Signed)
Uncontrolled on current Lisinopril 20mg  daily and metoprolol 100mg  daily. Patient asymptomatic. She was previously on HCTZ but this was stopped due to symptomatic low BP (SBP ~100).  Plan: --increase lisinopril to 40mg  daily --f/u in 2 weeks for BP check and Bmet

## 2017-01-26 NOTE — Progress Notes (Signed)
Internal Medicine Clinic Attending  Case discussed with Dr. Svalina  at the time of the visit.  We reviewed the resident's history and exam and pertinent patient test results.  I agree with the assessment, diagnosis, and plan of care documented in the resident's note.  

## 2017-02-12 ENCOUNTER — Encounter: Payer: Self-pay | Admitting: *Deleted

## 2017-02-12 NOTE — Progress Notes (Signed)
IFOBT kit sent by mail per Dr. Butcher.  VBarrow 02/12/17 

## 2017-02-24 DIAGNOSIS — E113592 Type 2 diabetes mellitus with proliferative diabetic retinopathy without macular edema, left eye: Secondary | ICD-10-CM | POA: Diagnosis not present

## 2017-02-24 DIAGNOSIS — H211X2 Other vascular disorders of iris and ciliary body, left eye: Secondary | ICD-10-CM | POA: Diagnosis not present

## 2017-02-24 DIAGNOSIS — H43812 Vitreous degeneration, left eye: Secondary | ICD-10-CM | POA: Diagnosis not present

## 2017-02-24 DIAGNOSIS — E113551 Type 2 diabetes mellitus with stable proliferative diabetic retinopathy, right eye: Secondary | ICD-10-CM | POA: Diagnosis not present

## 2017-03-18 ENCOUNTER — Telehealth: Payer: Self-pay | Admitting: *Deleted

## 2017-03-18 NOTE — Telephone Encounter (Signed)
Called pt - no answer; left message to give me a call back and up coming appt w/Dr Samuella CotaSvalina on 3/20.

## 2017-03-23 DIAGNOSIS — Z1211 Encounter for screening for malignant neoplasm of colon: Secondary | ICD-10-CM | POA: Diagnosis not present

## 2017-03-23 NOTE — Progress Notes (Signed)
   CC: hypertension  HPI:  [name] is a 67 y.o. with a PMH of T2DM with peripheral neuropathy, HTN, HLD, GERD presenting to clinic for f/u HTN.  Please see problem based Assessment and Plan for status of patients chronic conditions.  Past Medical History:  Diagnosis Date  . Coronary atherosclerosis 04/13/2008   Cath in 2001 (for DOE) showed small vessel coronary disease; Myoview 2010 probable soft tissue attenuation but overall normal perfusion and normal LV function. An echocardiogram 2004 revealed normal LV function, mild LAE and mild MR. Myoview 2018 low risk.  Marland Kitchen. GERD (gastroesophageal reflux disease)   . History of cataract surgery 03/01/2015  . Hyperlipidemia 04/13/2008   Declines statins - myalgias  . Hypertension   . Nail avulsion of toe 11/26/2015   right great toe - nontraumatic  . Parkinson's disease (HCC)    questionable; patient endorses diagnosis; no symptoms  . Type 2 diabetes mellitus with proliferative retinopathy without macular edema (HCC) 04/13/2008   [facility]14 [facility]: Severe proliferative retinopathy with clinically significant macular edema, stable vitreous hemorrhage of left eye, and stable age-related nuclear cataract of both eyes.  08/09/14: [facility]: Bilateral rapidly progressive diabetic retinopathy with no macular edema     Review of Systems:   ROS Per HPI  Physical Exam:  Vitals:   03/24/17 1538 03/27/17 1923  BP: (!) 164/75 132/78  Pulse: 63   Temp: 97.8 F (36.6 C)   TempSrc: Oral   SpO2: 98%   Weight: 201 lb (91.2 kg)    GENERAL- alert, co-operative, appears as stated age, not in any distress. HEENT- oral mucosa appears moist CARDIAC- RRR, no murmurs, rubs or gallops. RESP- Moving equal volumes of air, and clear to auscultation bilaterally, no wheezes or crackles. ABDOMEN- Soft, nontender, bowel sounds present. NEURO- No obvious Cr N abnormality. EXTREMITIES- pulse 2+, symmetric, no pedal  edema. SKIN- Warm, dry. PSYCH- Normal mood and affect, appropriate thought content and speech.  Assessment & Plan:   See Encounters Tab for problem based charting.   Patient discussed with Dr. Valla LeaverButcher   Destiny Renken, MD Internal Medicine PGY2

## 2017-03-24 ENCOUNTER — Encounter (INDEPENDENT_AMBULATORY_CARE_PROVIDER_SITE_OTHER): Payer: Self-pay

## 2017-03-24 ENCOUNTER — Other Ambulatory Visit: Payer: Self-pay

## 2017-03-24 ENCOUNTER — Ambulatory Visit (INDEPENDENT_AMBULATORY_CARE_PROVIDER_SITE_OTHER): Payer: Medicare Other | Admitting: Internal Medicine

## 2017-03-24 ENCOUNTER — Encounter: Payer: Self-pay | Admitting: Internal Medicine

## 2017-03-24 VITALS — BP 132/78 | HR 63 | Temp 97.8°F | Wt 201.0 lb

## 2017-03-24 DIAGNOSIS — Z7982 Long term (current) use of aspirin: Secondary | ICD-10-CM | POA: Diagnosis not present

## 2017-03-24 DIAGNOSIS — Z79899 Other long term (current) drug therapy: Secondary | ICD-10-CM | POA: Diagnosis not present

## 2017-03-24 DIAGNOSIS — I10 Essential (primary) hypertension: Secondary | ICD-10-CM

## 2017-03-24 DIAGNOSIS — E113599 Type 2 diabetes mellitus with proliferative diabetic retinopathy without macular edema, unspecified eye: Secondary | ICD-10-CM | POA: Diagnosis not present

## 2017-03-24 DIAGNOSIS — Z794 Long term (current) use of insulin: Secondary | ICD-10-CM

## 2017-03-24 DIAGNOSIS — K219 Gastro-esophageal reflux disease without esophagitis: Secondary | ICD-10-CM | POA: Diagnosis not present

## 2017-03-24 DIAGNOSIS — F17220 Nicotine dependence, chewing tobacco, uncomplicated: Secondary | ICD-10-CM

## 2017-03-24 DIAGNOSIS — Z0000 Encounter for general adult medical examination without abnormal findings: Secondary | ICD-10-CM

## 2017-03-24 DIAGNOSIS — E1142 Type 2 diabetes mellitus with diabetic polyneuropathy: Secondary | ICD-10-CM | POA: Diagnosis not present

## 2017-03-24 DIAGNOSIS — Z1211 Encounter for screening for malignant neoplasm of colon: Secondary | ICD-10-CM

## 2017-03-24 DIAGNOSIS — E785 Hyperlipidemia, unspecified: Secondary | ICD-10-CM | POA: Diagnosis not present

## 2017-03-24 LAB — POCT GLYCOSYLATED HEMOGLOBIN (HGB A1C): Hemoglobin A1C: 8.4

## 2017-03-24 LAB — GLUCOSE, CAPILLARY: GLUCOSE-CAPILLARY: 160 mg/dL — AB (ref 65–99)

## 2017-03-24 MED ORDER — LIRAGLUTIDE 18 MG/3ML ~~LOC~~ SOPN: 0.6000 mg | PEN_INJECTOR | Freq: Every day | SUBCUTANEOUS | 2 refills | Status: DC

## 2017-03-24 NOTE — Patient Instructions (Signed)
For your diabetes: Continue lantus 42 units at night Continue Humalog 12 units twice daily with meals Continue metformin 1000mg  daily Start Victoza 0.6mg  daily, if tolerating side effects after 1 to 2 weeks, increase to 1.2mg  daily. This medicine will likely need prior authorization so you may not be able to pick it up right away from the pharmacy.  Come back in 3 months so we can check on your diabetes. Please make an appointment to get your mammogram and bone scan's done.

## 2017-03-25 LAB — BMP8+ANION GAP
Anion Gap: 15 mmol/L (ref 10.0–18.0)
BUN / CREAT RATIO: 28 (ref 12–28)
BUN: 22 mg/dL (ref 8–27)
CHLORIDE: 104 mmol/L (ref 96–106)
CO2: 24 mmol/L (ref 20–29)
Calcium: 9.5 mg/dL (ref 8.7–10.3)
Creatinine, Ser: 0.79 mg/dL (ref 0.57–1.00)
GFR calc non Af Amer: 78 mL/min/{1.73_m2} (ref >59)
GFR, EST AFRICAN AMERICAN: 90 mL/min/{1.73_m2} (ref >59)
GLUCOSE: 101 mg/dL — AB (ref 65–99)
Potassium: 4.1 mmol/L (ref 3.5–5.2)
Sodium: 143 mmol/L (ref 134–144)

## 2017-03-26 LAB — FECAL OCCULT BLOOD, IMMUNOCHEMICAL: Fecal Occult Bld: NEGATIVE

## 2017-03-27 ENCOUNTER — Encounter: Payer: Self-pay | Admitting: Internal Medicine

## 2017-03-27 NOTE — Assessment & Plan Note (Signed)
Patient reminded to set up appt for dexa scan and mammogram. She brought in her stool cards today

## 2017-03-27 NOTE — Assessment & Plan Note (Signed)
Patient with last A1c of 7.4 in nov 2018. At last visit, her empagliflozin was discontinued due to UTIs; she was supposed to f/u with glucometer so her regimen could be adjusted however she did not. She states that she has self increased her Lantus to 42 units qhs and Novolog to 12 units BID (from 40 and 10 respectively) due to seeing an uptrend in her CBGs. She continue to take metformin 1000mg  daily. She denies hypoglycemic episodes. She brings in her glucometer which has only a handful of consecutive readings - fasting 120-200. She denies polyuria, polydipsia, n/v.  Plan: --A1c today worsening to 8.4 in setting of stopping empagliflozin --patient in agreement with starting victoza; she was previously on byetta and trulicity however was unable to tolerate due to side effects of dizziness and n/v after taking them --f/u in 3 months

## 2017-03-27 NOTE — Assessment & Plan Note (Addendum)
At last appt, lisinopril was increased to 40mg  daily; patient endorses compliance with this. She denies chest pain, shortness of breath, headaches, vision or hearing changes, focal weakness/numbness.  BP well controlled on repeat measurement.  Plan: --continue lisinopril 40mg  daily, metoprolol 100mg  daily --f/u Bmet - renal function and K stable

## 2017-03-30 NOTE — Progress Notes (Signed)
Internal Medicine Clinic Attending  Case discussed with Dr. Svalina  at the time of the visit.  We reviewed the resident's history and exam and pertinent patient test results.  I agree with the assessment, diagnosis, and plan of care documented in the resident's note.  

## 2017-04-28 ENCOUNTER — Telehealth: Payer: Self-pay | Admitting: *Deleted

## 2017-04-28 NOTE — Telephone Encounter (Signed)
Received PA request from pt's pharmacy for Victoza.  PA submitted online via Cover My Meds and is pending determination.Kingsley SpittleGoldston, Lashe Oliveira Cassady4/24/20191:29 PM     Cover My Meds:  KEY: [id number] BIN: [id number] PCN: [id number] Group: [id number] ID#: [id number]

## 2017-04-28 NOTE — Telephone Encounter (Signed)
Request was approved-pharmacy contacted, medication will cost $3.80.  Attempted to contact pt no answer, message left on recorder.Destiny SpittleGoldston, Destiny Grose Cassady4/24/20191:47 PM

## 2017-05-25 DIAGNOSIS — H10413 Chronic giant papillary conjunctivitis, bilateral: Secondary | ICD-10-CM | POA: Diagnosis not present

## 2017-05-25 DIAGNOSIS — Z961 Presence of intraocular lens: Secondary | ICD-10-CM | POA: Diagnosis not present

## 2017-05-25 DIAGNOSIS — H04123 Dry eye syndrome of bilateral lacrimal glands: Secondary | ICD-10-CM | POA: Diagnosis not present

## 2017-05-25 DIAGNOSIS — E113513 Type 2 diabetes mellitus with proliferative diabetic retinopathy with macular edema, bilateral: Secondary | ICD-10-CM | POA: Diagnosis not present

## 2017-05-28 ENCOUNTER — Other Ambulatory Visit: Payer: Self-pay | Admitting: Internal Medicine

## 2017-05-28 DIAGNOSIS — E113599 Type 2 diabetes mellitus with proliferative diabetic retinopathy without macular edema, unspecified eye: Secondary | ICD-10-CM

## 2017-05-30 ENCOUNTER — Other Ambulatory Visit: Payer: Self-pay | Admitting: Internal Medicine

## 2017-06-01 ENCOUNTER — Other Ambulatory Visit: Payer: Self-pay | Admitting: *Deleted

## 2017-06-18 ENCOUNTER — Telehealth: Payer: Self-pay | Admitting: *Deleted

## 2017-06-18 MED ORDER — GLUCOSE BLOOD VI STRP: ORAL_STRIP | 4 refills | Status: DC

## 2017-06-18 NOTE — Telephone Encounter (Signed)
Resent prescription today

## 2017-06-28 ENCOUNTER — Other Ambulatory Visit: Payer: Self-pay | Admitting: Internal Medicine

## 2017-06-28 DIAGNOSIS — Z794 Long term (current) use of insulin: Principal | ICD-10-CM

## 2017-06-28 DIAGNOSIS — E113599 Type 2 diabetes mellitus with proliferative diabetic retinopathy without macular edema, unspecified eye: Secondary | ICD-10-CM

## 2017-07-07 ENCOUNTER — Encounter: Payer: Medicare Other | Admitting: Internal Medicine

## 2017-07-22 DIAGNOSIS — H35372 Puckering of macula, left eye: Secondary | ICD-10-CM | POA: Diagnosis not present

## 2017-07-22 DIAGNOSIS — H43812 Vitreous degeneration, left eye: Secondary | ICD-10-CM | POA: Diagnosis not present

## 2017-07-22 DIAGNOSIS — E113551 Type 2 diabetes mellitus with stable proliferative diabetic retinopathy, right eye: Secondary | ICD-10-CM | POA: Diagnosis not present

## 2017-07-22 DIAGNOSIS — E113592 Type 2 diabetes mellitus with proliferative diabetic retinopathy without macular edema, left eye: Secondary | ICD-10-CM | POA: Diagnosis not present

## 2017-07-22 DIAGNOSIS — H26492 Other secondary cataract, left eye: Secondary | ICD-10-CM | POA: Diagnosis not present

## 2017-07-22 DIAGNOSIS — H211X2 Other vascular disorders of iris and ciliary body, left eye: Secondary | ICD-10-CM | POA: Diagnosis not present

## 2017-08-04 ENCOUNTER — Encounter: Payer: Self-pay | Admitting: Internal Medicine

## 2017-08-04 ENCOUNTER — Ambulatory Visit (INDEPENDENT_AMBULATORY_CARE_PROVIDER_SITE_OTHER): Payer: Medicare Other | Admitting: Internal Medicine

## 2017-08-04 VITALS — BP 160/76 | HR 76 | Temp 97.4°F | Ht 61.0 in | Wt 200.6 lb

## 2017-08-04 DIAGNOSIS — I10 Essential (primary) hypertension: Secondary | ICD-10-CM | POA: Diagnosis not present

## 2017-08-04 DIAGNOSIS — E113599 Type 2 diabetes mellitus with proliferative diabetic retinopathy without macular edema, unspecified eye: Secondary | ICD-10-CM

## 2017-08-04 DIAGNOSIS — Z7982 Long term (current) use of aspirin: Secondary | ICD-10-CM

## 2017-08-04 DIAGNOSIS — Z794 Long term (current) use of insulin: Secondary | ICD-10-CM

## 2017-08-04 DIAGNOSIS — M25511 Pain in right shoulder: Secondary | ICD-10-CM

## 2017-08-04 DIAGNOSIS — F321 Major depressive disorder, single episode, moderate: Secondary | ICD-10-CM

## 2017-08-04 DIAGNOSIS — E782 Mixed hyperlipidemia: Secondary | ICD-10-CM

## 2017-08-04 DIAGNOSIS — E1142 Type 2 diabetes mellitus with diabetic polyneuropathy: Secondary | ICD-10-CM | POA: Diagnosis not present

## 2017-08-04 DIAGNOSIS — K219 Gastro-esophageal reflux disease without esophagitis: Secondary | ICD-10-CM | POA: Diagnosis not present

## 2017-08-04 DIAGNOSIS — Z79899 Other long term (current) drug therapy: Secondary | ICD-10-CM | POA: Diagnosis not present

## 2017-08-04 DIAGNOSIS — I2510 Atherosclerotic heart disease of native coronary artery without angina pectoris: Secondary | ICD-10-CM

## 2017-08-04 DIAGNOSIS — E785 Hyperlipidemia, unspecified: Secondary | ICD-10-CM

## 2017-08-04 DIAGNOSIS — M5442 Lumbago with sciatica, left side: Secondary | ICD-10-CM

## 2017-08-04 DIAGNOSIS — Z0000 Encounter for general adult medical examination without abnormal findings: Secondary | ICD-10-CM

## 2017-08-04 DIAGNOSIS — M5432 Sciatica, left side: Secondary | ICD-10-CM | POA: Diagnosis not present

## 2017-08-04 DIAGNOSIS — R2689 Other abnormalities of gait and mobility: Secondary | ICD-10-CM | POA: Diagnosis not present

## 2017-08-04 DIAGNOSIS — F17220 Nicotine dependence, chewing tobacco, uncomplicated: Secondary | ICD-10-CM | POA: Diagnosis not present

## 2017-08-04 DIAGNOSIS — G8929 Other chronic pain: Secondary | ICD-10-CM

## 2017-08-04 LAB — GLUCOSE, CAPILLARY: GLUCOSE-CAPILLARY: 75 mg/dL (ref 70–99)

## 2017-08-04 LAB — POCT GLYCOSYLATED HEMOGLOBIN (HGB A1C): Hemoglobin A1C: 7.7 % — AB (ref 4.0–5.6)

## 2017-08-04 MED ORDER — INSULIN LISPRO 100 UNIT/ML (KWIKPEN): 12.0000 [IU] | PEN_INJECTOR | Freq: Two times a day (BID) | SUBCUTANEOUS | 3 refills | Status: DC

## 2017-08-04 MED ORDER — DULOXETINE HCL 30 MG PO CPEP: 30.0000 mg | ORAL_CAPSULE | Freq: Every day | ORAL | 2 refills | Status: DC

## 2017-08-04 MED ORDER — PRAVASTATIN SODIUM 10 MG PO TABS: 10.0000 mg | ORAL_TABLET | Freq: Every evening | ORAL | 0 refills | Status: DC

## 2017-08-04 MED ORDER — INSULIN GLARGINE 100 UNIT/ML SOLOSTAR PEN: 42.0000 [IU] | PEN_INJECTOR | Freq: Every day | SUBCUTANEOUS | 2 refills | Status: DC

## 2017-08-04 MED ORDER — METFORMIN HCL 1000 MG PO TABS: 1000.0000 mg | ORAL_TABLET | Freq: Every day | ORAL | 3 refills | Status: AC

## 2017-08-04 MED ORDER — AMLODIPINE BESYLATE 5 MG PO TABS: 5.0000 mg | ORAL_TABLET | Freq: Every day | ORAL | 2 refills | Status: DC

## 2017-08-04 MED ORDER — METOPROLOL SUCCINATE ER 100 MG PO TB24: ORAL_TABLET | ORAL | 3 refills | Status: AC

## 2017-08-04 NOTE — Patient Instructions (Addendum)
For your diabetes, increase the Victoza to 1.2mg  daily (0.322mL).  For your back pain, I have placed a referral to physical therapy; you should be hearing about an appointment in the next week. If you don't hear back, please call our clinic.  We will also start cymbalta (duloxetine) 30mg  daily; you can increase this to 60mg  daily (2 capsules) in 1 week if you are tolerating it well and feel you can have more benefit from it.   For your blood pressure, continue taking metoprolol and lisinopril daily. Start taking amlodipine 5mg  one pill once a day.

## 2017-08-04 NOTE — Progress Notes (Signed)
CC: diabetes  HPI:  [name] is a 67 y.o. with a PMH of T2DM with peripheral neuropathy, HTN, HLD, GERD presenting to clinic for f/u HTN and T2DM.  HTN: Patient endorses compliance with lisinopril 40mg  dialy and metoprolol 100mg  daily. She denies headaches, vision or hearing changes, focal weakness, chest pain or shortness of breath.  T2DM: Patient endorses compliance with Lantus 42 units qhs, Novolog 12 units BID with meals, metformin 1000mg  daily, and Victoza (is uncertain about dose). She endorses some episodes of hypoglycemia per month usually if she waits too long to eat. She does not have a set eating schedule. She states that her AM fasting CBGs are 120-160s; PM CBGs in 170s usually.   Right shoulder pain:  Stable; it does improve with capsacin creme.  Left sided sciatica:  Worse first thing in the morning, but improves significantly with application of capsacin creme which allows her to go on with her day. She has noted deconditioning over the last year which she attributes to her pain limiting her activity. She endorses stable bilateral pedal peripheral neuropathy.  Please see problem based Assessment and Plan for status of patients chronic conditions.  Past Medical History:  Diagnosis Date  . Coronary atherosclerosis 04/13/2008   Cath in 2001 (for DOE) showed small vessel coronary disease; Myoview 2010 probable soft tissue attenuation but overall normal perfusion and normal LV function. An echocardiogram 2004 revealed normal LV function, mild LAE and mild MR. Myoview 2018 low risk.  Marland Kitchen GERD (gastroesophageal reflux disease)   . History of cataract surgery 03/01/2015  . Hyperlipidemia 04/13/2008   Declines statins - myalgias  . Hypertension   . Nail avulsion of toe 11/26/2015   right great toe - nontraumatic  . Parkinson's disease (HCC)    questionable; patient endorses diagnosis; no symptoms  . Type 2 diabetes mellitus with proliferative retinopathy without  macular edema (HCC) 04/13/2008   [facility]14 [facility]: Severe proliferative retinopathy with clinically significant macular edema, stable vitreous hemorrhage of left eye, and stable age-related nuclear cataract of both eyes.  08/09/14: [facility]: Bilateral rapidly progressive diabetic retinopathy with no macular edema     Review of Systems:   ROS Per HPI  Physical Exam:  Vitals:   08/04/17 1411  BP: (!) 160/76  Pulse: 76  Temp: (!) 97.4 F (36.3 C)  TempSrc: Oral  SpO2: 99%  Weight: 200 lb 9.6 oz (91 kg)  Height: 5\' 1"  (1.549 m)   GENERAL- alert, co-operative, appears as stated age, not in any distress. HEENT- EOMI, oral mucosa appears moist. CARDIAC- RRR, no murmurs, rubs or gallops. RESP- Moving equal volumes of air, and clear to auscultation bilaterally, no wheezes or crackles. ABDOMEN- Soft, nontender, bowel sounds present. NEURO- CN 2-12 grossly intact. EXTREMITIES- pulse 1+ PT/DP, symmetric, no pedal edema. Antalgic gait. Anterior right shoulder point tenderness; strength and sensation intact in bil UEs; painful to flex/abduct/internally rotate at shoulder. Bil but R>L trap/subscap muscle spasm SKIN- Warm, dry. PSYCH- Normal mood and affect, appropriate thought content and speech.  Assessment & Plan:   See Encounters Tab for problem based charting.  Essential hypertension Uncontrolled; asymptomatic.  Plan: --start amlodipine 5mg  daily (patient doesn't remember taking this medicine before but listed as allergy of N/V which could be caused by any number of things) --continue lisinopril 40mg  daily --continue metoprolol 100mg  daily --would avoid thiazides as she has chronic vestibular dizziness which was worsened by HCTZ in the past --f/u in  1 month  Type 2 diabetes mellitus with proliferative retinopathy without macular edema (HCC) A1c 7.7 today; improved but still uncontrolled.  Plan: --increase Victoza to next dose (asked her to take  her pen to her pharmacy for assistance with this) --Continue Lantus 42 units qhs, Novolog 12 units BID wc --Continue metformin 1000mg  daily --Asked her to eat more consistently throughout the day to avoid lows which may be contributing to her continued elevated A1c --f/u in 3 months --foot exam completed today --on pravastatin 10mg  every couple of days (previous significant myalgias with atorva/rosuva and pravastatin so starting back slowly)  Healthcare maintenance Reminded patient to schedule mammogram and dexa scan  Left-sided back pain Patient with chronic sciatica on the left which is improved with capsacin creme, however she still feels it limits her mobility. She is in agreement with pursuing physical therapy to help with this and help with her deconditioning.  Current moderate episode of major depressive disorder without prior episode (HCC) PHQ9 of 13 today. She denies SI/HI. She is in agreement with starting pharmacotherapy. In setting of diabetic peripheral neuropathy and likely component of OA in her back and shoulder will start duloxetine for combined benefit.  Plan: --duloxetine 30mg  daily; increase to 60mg  daily in 1 wk if tolerating well and need further symptom improvement. --f/u in 1 month  Chronic right shoulder pain Chronic right shoulder pain that is improved with capsacin creme but still limiting to patient activity. Exam reveals point tenderness on anterior shoulder and pain with active abduction, flexion, and internal rotation of joint as well as significant trap/subscapularis muscle spasm.  Plan: --continue capsaicin creme --duloxetine started likely component of anxiety with her depression leading to shoulder muscle tension --advised on proper posture and good pillow support --PT referral --may consider u/s with joint injection at f/u visits    Patient discussed with Dr. Argentina PonderGranfortuna   Luz Burcher, MD Internal Medicine PGY-3

## 2017-08-05 ENCOUNTER — Encounter: Payer: Self-pay | Admitting: Internal Medicine

## 2017-08-05 DIAGNOSIS — M25511 Pain in right shoulder: Secondary | ICD-10-CM

## 2017-08-05 DIAGNOSIS — M549 Dorsalgia, unspecified: Secondary | ICD-10-CM | POA: Insufficient documentation

## 2017-08-05 DIAGNOSIS — G8929 Other chronic pain: Secondary | ICD-10-CM | POA: Insufficient documentation

## 2017-08-05 DIAGNOSIS — F321 Major depressive disorder, single episode, moderate: Secondary | ICD-10-CM | POA: Insufficient documentation

## 2017-08-05 NOTE — Assessment & Plan Note (Signed)
Chronic right shoulder pain that is improved with capsacin creme but still limiting to patient activity. Exam reveals point tenderness on anterior shoulder and pain with active abduction, flexion, and internal rotation of joint as well as significant trap/subscapularis muscle spasm.  Plan: --continue capsaicin creme --duloxetine started likely component of anxiety with her depression leading to shoulder muscle tension --advised on proper posture and good pillow support --PT referral --may consider u/s with joint injection at f/u visits

## 2017-08-05 NOTE — Assessment & Plan Note (Signed)
A1c 7.7 today; improved but still uncontrolled.  Plan: --increase Victoza to next dose (asked her to take her pen to her pharmacy for assistance with this) --Continue Lantus 42 units qhs, Novolog 12 units BID wc --Continue metformin 1000mg  daily --Asked her to eat more consistently throughout the day to avoid lows which may be contributing to her continued elevated A1c --f/u in 3 months --foot exam completed today --on pravastatin 10mg  every couple of days (previous significant myalgias with atorva/rosuva and pravastatin so starting back slowly)

## 2017-08-05 NOTE — Assessment & Plan Note (Signed)
Reminded patient to schedule mammogram and dexa scan

## 2017-08-05 NOTE — Assessment & Plan Note (Signed)
Uncontrolled; asymptomatic.  Plan: --start amlodipine 5mg  daily (patient doesn't remember taking this medicine before but listed as allergy of N/V which could be caused by any number of things) --continue lisinopril 40mg  daily --continue metoprolol 100mg  daily --would avoid thiazides as she has chronic vestibular dizziness which was worsened by HCTZ in the past --f/u in 1 month

## 2017-08-05 NOTE — Assessment & Plan Note (Signed)
Patient with chronic sciatica on the left which is improved with capsacin creme, however she still feels it limits her mobility. She is in agreement with pursuing physical therapy to help with this and help with her deconditioning.

## 2017-08-05 NOTE — Assessment & Plan Note (Signed)
PHQ9 of 13 today. She denies SI/HI. She is in agreement with starting pharmacotherapy. In setting of diabetic peripheral neuropathy and likely component of OA in her back and shoulder will start duloxetine for combined benefit.  Plan: --duloxetine 30mg  daily; increase to 60mg  daily in 1 wk if tolerating well and need further symptom improvement. --f/u in 1 month

## 2017-08-07 NOTE — Progress Notes (Signed)
Medicine attending: Medical history, presenting problems, physical findings, and medications, reviewed with resident physician Dr Gorica Svalina on the day of the patient visit and I concur with her evaluation and management plan. 

## 2017-08-12 NOTE — Addendum Note (Signed)
Addended by: Nyra MarketSVALINA, Alayziah Tangeman on: 08/12/2017 12:04 PM   Modules accepted: Orders

## 2017-08-25 ENCOUNTER — Encounter: Payer: Self-pay | Admitting: Physical Therapy

## 2017-08-25 ENCOUNTER — Ambulatory Visit
Payer: Medicare Other | Attending: Student in an Organized Health Care Education/Training Program | Admitting: Physical Therapy

## 2017-08-25 ENCOUNTER — Other Ambulatory Visit: Payer: Self-pay

## 2017-08-25 DIAGNOSIS — R296 Repeated falls: Secondary | ICD-10-CM | POA: Insufficient documentation

## 2017-08-25 DIAGNOSIS — G8929 Other chronic pain: Secondary | ICD-10-CM | POA: Diagnosis not present

## 2017-08-25 DIAGNOSIS — M25511 Pain in right shoulder: Secondary | ICD-10-CM | POA: Diagnosis not present

## 2017-08-25 DIAGNOSIS — M5442 Lumbago with sciatica, left side: Secondary | ICD-10-CM | POA: Insufficient documentation

## 2017-08-25 DIAGNOSIS — R262 Difficulty in walking, not elsewhere classified: Secondary | ICD-10-CM

## 2017-08-26 ENCOUNTER — Encounter: Payer: Self-pay | Admitting: Physical Therapy

## 2017-08-26 NOTE — Therapy (Signed)
[facility] [address] [place], [place], [address] Phone: [phone]   Fax:  [phone]  Physical Therapy Evaluation  Patient Details  Name: Destiny Russell MRN: [id number] Date of Birth: March 22, 1950 Referring Provider: Dr Nyra Market    Encounter Date: 08/25/2017  PT End of Session - 08/26/17 1450    Visit Number  1    Number of Visits  16    Date for PT Re-Evaluation  12/16/17    Authorization Type  medicare     PT Start Time  0300    PT Stop Time  0344    PT Time Calculation (min)  44 min    Activity Tolerance  Patient tolerated treatment well    Behavior During Therapy  [facility] for tasks assessed/performed       Past Medical History:  Diagnosis Date  . Coronary atherosclerosis 04/13/2008   Cath in 2001 (for DOE) showed small vessel coronary disease; Myoview 2010 probable soft tissue attenuation but overall normal perfusion and normal LV function. An echocardiogram 2004 revealed normal LV function, mild LAE and mild MR. Myoview 2018 low risk.  Marland Kitchen GERD (gastroesophageal reflux disease)   . History of cataract surgery 03/01/2015  . Hyperlipidemia 04/13/2008   Declines statins - myalgias  . Hypertension   . Nail avulsion of toe 11/26/2015   right great toe - nontraumatic  . Parkinson's disease (HCC)    questionable; patient endorses diagnosis; no symptoms  . Type 2 diabetes mellitus with proliferative retinopathy without macular edema (HCC) 04/13/2008   [facility]: Severe proliferative retinopathy with clinically significant macular edema, stable vitreous hemorrhage of left eye, and stable age-related nuclear cataract of both eyes.  08/09/14: [facility]: Bilateral rapidly progressive diabetic retinopathy with no macular edema     Past Surgical History:  Procedure Laterality Date  . CARDIAC CATHETERIZATION    . cataract    . CESAREAN SECTION      There were no vitals filed for  this visit.   Subjective Assessment - 08/25/17 1506    Subjective  Patient has had low back pain and shoulder pain for about 6 months. Her shoulder hurts more when she uses it. her low back hurts worst in the mornings. She has poor balance. She fells last Wednesday and the screen door hit her in the head.     Limitations  Standing;Walking    How long can you sit comfortably?  depends but has to shift often     How long can you stand comfortably?  >5 min     How long can you walk comfortably?  limited community distances with a cane     Diagnostic tests  nothing recent in the shoulder and hip     Patient Stated Goals  to have less pain     Currently in Pain?  Yes    Pain Score  6     Pain Descriptors / Indicators  Sharp    Pain Radiating Towards  raidates down the back of her leg     Pain Onset  More than a month ago    Pain Frequency  Constant    Effect of Pain on Daily Activities  hurts to move it     Multiple Pain Sites  Yes    Pain Score  8   with quick movements    Pain Location  Shoulder    Pain Orientation  Right    Pain  Descriptors / Indicators  Sharp    Pain Radiating Towards  can radiate down her arm     Pain Onset  More than a month ago    Pain Frequency  Constant    Aggravating Factors   Standing and walking     Pain Relieving Factors  rest          [facility] PT Assessment - 08/26/17 0001      Assessment   Medical Diagnosis  Low back Pain/ Right Shoulder Pain     Referring Provider  Dr Nyra Market     Onset Date/Surgical Date  --   6 months prior    Hand Dominance  Right    Prior Therapy  For bakanbce at our Neuro clinic       Precautions   Precautions  Fall      Restrictions   Weight Bearing Restrictions  No      Balance Screen   Has the patient fallen in the past 6 months  Yes    How many times?  --   Has had many falls. Can not recall how many    Has the patient had a decrease in activity level because of a fear of falling?   Yes    Is the patient  reluctant to leave their home because of a fear of falling?   Yes      Prior Function   Level of Independence  Requires assistive device for independence    Vocation  Retired      IT consultant   Overall Cognitive Status  Within Functional Limits for tasks assessed    Memory  Appears intact    Awareness  Appears intact    Problem Solving  Appears intact      Sensation   Light Touch  Appears Intact      Coordination   Gross Motor Movements are Fluid and Coordinated  Yes    Fine Motor Movements are Fluid and Coordinated  Yes      AROM   Right Shoulder Flexion  84 Degrees    Right Shoulder ABduction  70 Degrees    Right Shoulder Internal Rotation  --   to troachnter with pain    Right Shoulder External Rotation  --   able to reach behind her head but has pain    Lumbar Flexion  35 degrees with pain     Lumbar Extension  10 degrees with pain     Lumbar - Right Side Bend  no pain     Lumbar - Left Side Bend  pain     Lumbar - Right Rotation  no pain     Lumbar - Left Rotation  pain       PROM   Right Shoulder Flexion  94 Degrees    Right Shoulder External Rotation  30 Degrees    Left Hip Flexion  95      Strength   Right Shoulder Flexion  3+/5    Right Shoulder Internal Rotation  3+/5    Right Shoulder External Rotation  3+/5    Left Shoulder Flexion  4+/5    Left Shoulder Internal Rotation  4+/5    Left Shoulder External Rotation  4+/5    Right Hip Flexion  5/5    Right Hip ABduction  5/5    Right Hip ADduction  5/5    Left Hip Flexion  4/5    Left Hip ABduction  4+/5    Left Hip  ADduction  4+/5      Right Hip   Right Hip Flexion  85      Palpation   Spinal mobility  uanble to get prone     Palpation comment  spasming in the left paraspinals into the left gluteal       Special Tests   Other special tests  hamstring 90/90 35 left 30 right       Ambulation/Gait   Gait Comments  right toe out; shifting to the right off the left. Cane on left side likley because of  right shoulder pain.       High Level Balance   High Level Balance Comments  tandem stance min a bilateral; narrow base min a                 Objective measurements completed on examination: See above findings.      [facility] Adult PT Treatment/Exercise - 08/26/17 0001      Lumbar Exercises: Stretches   Active Hamstring Stretch Limitations  seated 3x20 sec hold     Lower Trunk Rotation Limitations  x10    Other Lumbar Stretch Exercise  tennis ball trigger point release in sitting              PT Education - 08/25/17 1516    Education Details  Symptom mangement, improtance of strengthening     Person(s) Educated  Patient    Methods  Explanation;Demonstration;Tactile cues;Verbal cues    Comprehension  Verbalized understanding;Verbal cues required;Returned demonstration;Tactile cues required       PT Short Term Goals - 08/26/17 1440      PT SHORT TERM GOAL #1   Title  Patient will increase passive right shoulder flexion to 115 degrees     Time  4    Period  Weeks    Status  New    Target Date  09/23/17      PT SHORT TERM GOAL #2   Title  Patient will increase lumbar flexion by 20 degrees without pain or loss of balance     Time  4    Period  Weeks    Status  New    Target Date  09/23/17      PT SHORT TERM GOAL #3   Title  Patient will increase passive shoulder ER to 40 degrees     Time  4    Period  Weeks    Status  New    Target Date  09/23/17      Additional Short Term Goals   Additional Short Term Goals  Yes        PT Long Term Goals - 08/26/17 1442      PT LONG TERM GOAL #1   Title  Patient will increse bilateral lower extremity strength to 4+/5 in order to improve functional mobility     Time  8    Period  Weeks    Status  New    Target Date  10/21/17      PT LONG TERM GOAL #2   Title  Patient will stand for 10 minutes without increased pain in order to perfrom her ADL's     Time  8    Period  Weeks    Status  New    Target Date   10/21/17      PT LONG TERM GOAL #3   Title  Patient will demonstrate a 55% limitation     Time  8  Period  Weeks    Status  New             Plan - 08/25/17 1625    Clinical Impression Statement  Patient is a 67 year old female with right shoulder and left sided low back pain. She has limited lumbar and shoulder active ROM. She has mild weakness in the right leg but significant weakness in her right shoulder. She has limited passive movement in her right shoulder as well. She would benefit from skilled therapy to decrease her lower back and shoulder pain andto improve the funtion of her right shoulder. She has been to neuro for her balance but she continues to have falls. Therapys main focus will be her back and shoulder but we will monitor her falls and make safety suggestions .     History and Personal Factors relevant to plan of care:  DM II with peripheral neuropathy; arthirtis in multiple joints     Clinical Presentation  Evolving    Clinical Presentation due to:  increasing pain in her back and shouldes, frequent falls     Clinical Decision Making  Moderate    Rehab Potential  Good    PT Frequency  2x / week    PT Duration  8 weeks    PT Treatment/Interventions  ADLs/Self Care Home Management;Cryotherapy;Retail banker;Therapeutic activities;Therapeutic exercise;Patient/family education;Neuromuscular re-education;Manual techniques;Taping;Passive range of motion    PT Next Visit Plan  begin with light coire strengthening. She isnt comfortable with supine positioning for too long but she would benefit from wand flexion and seated or supine wand ER; consdier scap retraction and shoulder extension; consdier PROM of shoulder into all planes.; consider seated exercise ball UE roll out; consder modalities PRN     Consulted and Agree with Plan of Care  Patient       Patient will benefit from skilled therapeutic intervention in order to improve the  following deficits and impairments:  Pain, Postural dysfunction, Increased muscle spasms, Decreased activity tolerance, Decreased endurance, Decreased range of motion, Decreased strength, Impaired UE functional use  Visit Diagnosis: Chronic right shoulder pain  Chronic left-sided low back pain with left-sided sciatica  Difficulty in walking, not elsewhere classified  Repeated falls     Problem List Patient Active Problem List   Diagnosis Date Noted  . Left-sided back pain 08/05/2017  . Chronic right shoulder pain 08/05/2017  . Current moderate episode of major depressive disorder without prior episode (HCC) 08/05/2017  . Chest pain on exertion 07/22/2016  . Dyspnea on exertion 06/04/2016  . Diabetic peripheral neuropathy (HCC) 06/04/2016  . Dizziness 05/07/2016  . GERD (gastroesophageal reflux disease) 10/28/2013  . Healthcare maintenance 09/07/2012  . Hyperlipidemia 04/13/2008  . Essential hypertension 04/13/2008  . Coronary atherosclerosis 04/13/2008  . Type 2 diabetes mellitus with proliferative retinopathy without macular edema (HCC) 04/13/2008    Dessie Coma PT DPT  08/26/2017, 2:51 PM  [facility] [address] [place], [place], [address] Phone: [phone]   Fax:  [phone]  Name: Destiny Russell MRN: [id number] Date of Birth: 06-18-1950

## 2017-08-30 ENCOUNTER — Ambulatory Visit: Payer: Medicare Other | Admitting: Physical Therapy

## 2017-08-30 ENCOUNTER — Other Ambulatory Visit: Payer: Self-pay | Admitting: Internal Medicine

## 2017-08-30 DIAGNOSIS — Z794 Long term (current) use of insulin: Principal | ICD-10-CM

## 2017-08-30 DIAGNOSIS — M25511 Pain in right shoulder: Principal | ICD-10-CM

## 2017-08-30 DIAGNOSIS — G8929 Other chronic pain: Secondary | ICD-10-CM | POA: Diagnosis not present

## 2017-08-30 DIAGNOSIS — M5442 Lumbago with sciatica, left side: Secondary | ICD-10-CM | POA: Diagnosis not present

## 2017-08-30 DIAGNOSIS — R296 Repeated falls: Secondary | ICD-10-CM | POA: Diagnosis not present

## 2017-08-30 DIAGNOSIS — R262 Difficulty in walking, not elsewhere classified: Secondary | ICD-10-CM | POA: Diagnosis not present

## 2017-08-30 DIAGNOSIS — E113599 Type 2 diabetes mellitus with proliferative diabetic retinopathy without macular edema, unspecified eye: Secondary | ICD-10-CM

## 2017-08-30 NOTE — Therapy (Signed)
[facility] [address] [place], [place], [address] Phone: [phone]   Fax:  [phone]  Physical Therapy Treatment  Patient Details  Name: Destiny Russell MRN: [id number] Date of Birth: 06/01/1950 Referring Provider: Dr Nyra Market    Encounter Date: 08/30/2017  PT End of Session - 08/30/17 1601    Visit Number  2    Number of Visits  16    Date for PT Re-Evaluation  12/16/17    Authorization Type  medicare     PT Start Time  1500    PT Stop Time  1545    PT Time Calculation (min)  45 min    Activity Tolerance  Patient tolerated treatment well    Behavior During Therapy  [facility] for tasks assessed/performed       Past Medical History:  Diagnosis Date  . Coronary atherosclerosis 04/13/2008   Cath in 2001 (for DOE) showed small vessel coronary disease; Myoview 2010 probable soft tissue attenuation but overall normal perfusion and normal LV function. An echocardiogram 2004 revealed normal LV function, mild LAE and mild MR. Myoview 2018 low risk.  Marland Kitchen GERD (gastroesophageal reflux disease)   . History of cataract surgery 03/01/2015  . Hyperlipidemia 04/13/2008   Declines statins - myalgias  . Hypertension   . Nail avulsion of toe 11/26/2015   right great toe - nontraumatic  . Parkinson's disease (HCC)    questionable; patient endorses diagnosis; no symptoms  . Type 2 diabetes mellitus with proliferative retinopathy without macular edema (HCC) 04/13/2008   [facility]: Severe proliferative retinopathy with clinically significant macular edema, stable vitreous hemorrhage of left eye, and stable age-related nuclear cataract of both eyes.  08/09/14[facility]: Bilateral rapidly progressive diabetic retinopathy with no macular edema     Past Surgical History:  Procedure Laterality Date  . CARDIAC CATHETERIZATION    . cataract    . CESAREAN SECTION      There were no vitals filed for  this visit.  Subjective Assessment - 08/30/17 1545    Subjective  Pt relays she feels about the same    Currently in Pain?  Yes    Pain Score  5     Pain Location  Shoulder    Pain Orientation  Right    Pain Score  5    Pain Location  Back    Pain Orientation  Left;Lower                       [facility] Adult PT Treatment/Exercise - 08/30/17 0001      Exercises   Exercises  Lumbar;Shoulder      Lumbar Exercises: Stretches   Active Hamstring Stretch Limitations  seated 2x30 sec hold     Lower Trunk Rotation Limitations  x10    Other Lumbar Stretch Exercise  seated ball roll out for flexion 5 sec X 15      Lumbar Exercises: Seated   Other Seated Lumbar Exercises  TAC 5 sec X 15, TAC with march X 10 bilat      Shoulder Exercises: Seated   Other Seated Exercises  seated cane flex, abd, ER AAROM X 10 ea      Shoulder Exercises: Pulleys   Flexion  2 minutes    ABduction  2 minutes      Modalities   Modalities  Electrical Stimulation;Moist Heat      Moist Heat Therapy  Number Minutes Moist Heat  10 Minutes    Moist Heat Location  Lumbar Spine;Shoulder      Electrical Stimulation   Electrical Stimulation Location  Lt lumbar, Rt shoulder    Electrical Stimulation Action  pre mod    Electrical Stimulation Parameters  tolerance    Electrical Stimulation Goals  Pain      Manual Therapy   Manual therapy comments  Rt shoulder PROM gentle to tolerance             PT Education - 08/30/17 1601    Education Details  technique for new therex    Person(s) Educated  Patient    Methods  Explanation;Demonstration;Verbal cues    Comprehension  Verbalized understanding;Returned demonstration       PT Short Term Goals - 08/26/17 1440      PT SHORT TERM GOAL #1   Title  Patient will increase passive right shoulder flexion to 115 degrees     Time  4    Period  Weeks    Status  New    Target Date  09/23/17      PT SHORT TERM GOAL #2   Title  Patient will  increase lumbar flexion by 20 degrees without pain or loss of balance     Time  4    Period  Weeks    Status  New    Target Date  09/23/17      PT SHORT TERM GOAL #3   Title  Patient will increase passive shoulder ER to 40 degrees     Time  4    Period  Weeks    Status  New    Target Date  09/23/17      Additional Short Term Goals   Additional Short Term Goals  Yes        PT Long Term Goals - 08/26/17 1442      PT LONG TERM GOAL #1   Title  Patient will increse bilateral lower extremity strength to 4+/5 in order to improve functional mobility     Time  8    Period  Weeks    Status  New    Target Date  10/21/17      PT LONG TERM GOAL #2   Title  Patient will stand for 10 minutes without increased pain in order to perfrom her ADL's     Time  8    Period  Weeks    Status  New    Target Date  10/21/17      PT LONG TERM GOAL #3   Title  Patient will demonstrate a 55% limitation     Time  8    Period  Weeks    Status  New            Plan - 08/30/17 1601    Clinical Impression Statement  Pt was able to progress AAROM today but needed cuing for technique and to stay in pain free ROM. Session focused on stretching with AAROM and PROM along with beginner core strengthening. Session ended with MHP with TENS and she expressed positive return from this.    Rehab Potential  Good    PT Frequency  2x / week    PT Duration  8 weeks    PT Treatment/Interventions  ADLs/Self Care Home Management;Cryotherapy;Retail bankerlectrical Stimulation;Gait training;Stair training;Therapeutic activities;Therapeutic exercise;Patient/family education;Neuromuscular re-education;Manual techniques;Taping;Passive range of motion    PT Next Visit Plan  begin with light coire strengthening. She isnt  comfortable with supine positioning for too long but she would benefit from wand flexion and seated or supine wand ER; consdier scap retraction and shoulder extension; consdier PROM of shoulder into all planes.;  consider seated exercise ball UE roll out; consder modalities PRN     Consulted and Agree with Plan of Care  Patient       Patient will benefit from skilled therapeutic intervention in order to improve the following deficits and impairments:  Pain, Postural dysfunction, Increased muscle spasms, Decreased activity tolerance, Decreased endurance, Decreased range of motion, Decreased strength, Impaired UE functional use  Visit Diagnosis: Chronic right shoulder pain  Chronic left-sided low back pain with left-sided sciatica     Problem List Patient Active Problem List   Diagnosis Date Noted  . Left-sided back pain 08/05/2017  . Chronic right shoulder pain 08/05/2017  . Current moderate episode of major depressive disorder without prior episode (HCC) 08/05/2017  . Chest pain on exertion 07/22/2016  . Dyspnea on exertion 06/04/2016  . Diabetic peripheral neuropathy (HCC) 06/04/2016  . Dizziness 05/07/2016  . GERD (gastroesophageal reflux disease) 10/28/2013  . Healthcare maintenance 09/07/2012  . Hyperlipidemia 04/13/2008  . Essential hypertension 04/13/2008  . Coronary atherosclerosis 04/13/2008  . Type 2 diabetes mellitus with proliferative retinopathy without macular edema (HCC) 04/13/2008    April Manson, PT, DPT 08/30/2017, 4:23 PM  [facility] [address] [place], [place], [address] Phone: [phone]   Fax:  [phone]  Name: Destiny M Husak MRN: [id number] Date of Birth: 1950-12-21

## 2017-09-02 ENCOUNTER — Ambulatory Visit: Payer: Medicare Other | Admitting: Physical Therapy

## 2017-09-02 DIAGNOSIS — M25511 Pain in right shoulder: Principal | ICD-10-CM

## 2017-09-02 DIAGNOSIS — G8929 Other chronic pain: Secondary | ICD-10-CM | POA: Diagnosis not present

## 2017-09-02 DIAGNOSIS — R296 Repeated falls: Secondary | ICD-10-CM | POA: Diagnosis not present

## 2017-09-02 DIAGNOSIS — M5442 Lumbago with sciatica, left side: Secondary | ICD-10-CM | POA: Diagnosis not present

## 2017-09-02 DIAGNOSIS — R262 Difficulty in walking, not elsewhere classified: Secondary | ICD-10-CM | POA: Diagnosis not present

## 2017-09-02 NOTE — Therapy (Signed)
[facility] [address] [place], [place], [address] Phone: [phone]   Fax:  [phone]  Physical Therapy Treatment  Patient Details  Name: Destiny Russell MRN: [id number] Date of Birth: 12-21-1950 Referring Provider: Dr Nyra Market    Encounter Date: 09/02/2017  PT End of Session - 09/02/17 1405    Visit Number  3    Number of Visits  16    Date for PT Re-Evaluation  12/16/17    Authorization Type  medicare     PT Start Time  1400    PT Stop Time  1445    PT Time Calculation (min)  45 min    Activity Tolerance  Patient tolerated treatment well    Behavior During Therapy  [facility] for tasks assessed/performed       Past Medical History:  Diagnosis Date  . Coronary atherosclerosis 04/13/2008   Cath in 2001 (for DOE) showed small vessel coronary disease; Myoview 2010 probable soft tissue attenuation but overall normal perfusion and normal LV function. An echocardiogram 2004 revealed normal LV function, mild LAE and mild MR. Myoview 2018 low risk.  Marland Kitchen GERD (gastroesophageal reflux disease)   . History of cataract surgery 03/01/2015  . Hyperlipidemia 04/13/2008   Declines statins - myalgias  . Hypertension   . Nail avulsion of toe 11/26/2015   right great toe - nontraumatic  . Parkinson's disease (HCC)    questionable; patient endorses diagnosis; no symptoms  . Type 2 diabetes mellitus with proliferative retinopathy without macular edema (HCC) 04/13/2008   [facility]: Severe proliferative retinopathy with clinically significant macular edema, stable vitreous hemorrhage of left eye, and stable age-related nuclear cataract of both eyes.  08/09/14: [facility]: Bilateral rapidly progressive diabetic retinopathy with no macular edema     Past Surgical History:  Procedure Laterality Date  . CARDIAC CATHETERIZATION    . cataract    . CESAREAN SECTION      There were no vitals filed for  this visit.  Subjective Assessment - 09/02/17 1402    Subjective  Pt relays she is feeling a little better, her back/hip are not really bothering her but still having shoulder pain    Currently in Pain?  Yes    Pain Score  5     Pain Location  Shoulder    Pain Orientation  Right    Multiple Pain Sites  No         [facility] PT Assessment - 09/02/17 0001      Assessment   Medical Diagnosis  Low back Pain/ Right Shoulder Pain       AROM   Right Shoulder Flexion  125 Degrees    Right Shoulder ABduction  95 Degrees                   [facility] Adult PT Treatment/Exercise - 09/02/17 1403      Exercises   Exercises  Lumbar;Shoulder      Lumbar Exercises: Stretches   Active Hamstring Stretch Limitations  seated 2x30 sec hold     Lower Trunk Rotation Limitations  --    Other Lumbar Stretch Exercise  seated ball roll out for flexion 5 sec X 15      Lumbar Exercises: Seated   Other Seated Lumbar Exercises  TAC with march X 10 bilat      Shoulder Exercises: Seated   Other Seated Exercises  seated cane flex, abd, ER AAROM X  15 ea      Shoulder Exercises: Standing   External Rotation  Right;15 reps    Theraband Level (Shoulder External Rotation)  Level 1 (Yellow)    Internal Rotation  Right;15 reps    Theraband Level (Shoulder Internal Rotation)  Level 1 (Yellow)    Extension  Strengthening;Both;15 reps    Theraband Level (Shoulder Extension)  Level 2 (Red)    Row  Strengthening;Both;15 reps    Theraband Level (Shoulder Row)  Level 2 (Red)      Shoulder Exercises: Pulleys   Flexion  2 minutes    ABduction  2 minutes      Modalities   Modalities  Electrical Stimulation;Moist Heat      Moist Heat Therapy   Number Minutes Moist Heat  15 Minutes    Moist Heat Location  Lumbar Spine;Shoulder      Electrical Stimulation   Electrical Stimulation Location  Rt shoulder    Electrical Stimulation Action  IFC    Electrical Stimulation Parameters  tolerance    Electrical  Stimulation Goals  Pain      Manual Therapy   Manual therapy comments  STM to Rt shoulder               PT Short Term Goals - 08/26/17 1440      PT SHORT TERM GOAL #1   Title  Patient will increase passive right shoulder flexion to 115 degrees     Time  4    Period  Weeks    Status  New    Target Date  09/23/17      PT SHORT TERM GOAL #2   Title  Patient will increase lumbar flexion by 20 degrees without pain or loss of balance     Time  4    Period  Weeks    Status  New    Target Date  09/23/17      PT SHORT TERM GOAL #3   Title  Patient will increase passive shoulder ER to 40 degrees     Time  4    Period  Weeks    Status  New    Target Date  09/23/17      Additional Short Term Goals   Additional Short Term Goals  Yes        PT Long Term Goals - 08/26/17 1442      PT LONG TERM GOAL #1   Title  Patient will increse bilateral lower extremity strength to 4+/5 in order to improve functional mobility     Time  8    Period  Weeks    Status  New    Target Date  10/21/17      PT LONG TERM GOAL #2   Title  Patient will stand for 10 minutes without increased pain in order to perfrom her ADL's     Time  8    Period  Weeks    Status  New    Target Date  10/21/17      PT LONG TERM GOAL #3   Title  Patient will demonstrate a 55% limitation     Time  8    Period  Weeks    Status  New            Plan - 09/02/17 1434    Clinical Impression Statement  Pt had increased shoulder AROM today see updated measurements. She is making good progress with her back and fair progress with her  shoulder. Session focused more on shoulder today as this was her biggest complaint. She was able to progress scapular and RTC strengthening today with good tolerance. STM and MHP with E-stim applied to further decrease pain.    Rehab Potential  Good    PT Frequency  2x / week    PT Duration  8 weeks    PT Treatment/Interventions  ADLs/Self Care Home  Management;Cryotherapy;Retail banker;Therapeutic activities;Therapeutic exercise;Patient/family education;Neuromuscular re-education;Manual techniques;Taping;Passive range of motion    PT Next Visit Plan  core, shoulder strenghtening and stretching, modalites and MT PRN    Consulted and Agree with Plan of Care  Patient       Patient will benefit from skilled therapeutic intervention in order to improve the following deficits and impairments:  Pain, Postural dysfunction, Increased muscle spasms, Decreased activity tolerance, Decreased endurance, Decreased range of motion, Decreased strength, Impaired UE functional use  Visit Diagnosis: Chronic right shoulder pain  Chronic left-sided low back pain with left-sided sciatica  Difficulty in walking, not elsewhere classified     Problem List Patient Active Problem List   Diagnosis Date Noted  . Left-sided back pain 08/05/2017  . Chronic right shoulder pain 08/05/2017  . Current moderate episode of major depressive disorder without prior episode (HCC) 08/05/2017  . Chest pain on exertion 07/22/2016  . Dyspnea on exertion 06/04/2016  . Diabetic peripheral neuropathy (HCC) 06/04/2016  . Dizziness 05/07/2016  . GERD (gastroesophageal reflux disease) 10/28/2013  . Healthcare maintenance 09/07/2012  . Hyperlipidemia 04/13/2008  . Essential hypertension 04/13/2008  . Coronary atherosclerosis 04/13/2008  . Type 2 diabetes mellitus with proliferative retinopathy without macular edema (HCC) 04/13/2008    April Manson, PT, DPT 09/02/2017, 2:38 PM  [facility] [address] [place], [place], [address] Phone: [phone]   Fax:  [phone]  Name: Destiny Russell MRN: [id number] Date of Birth: 21-Oct-1950

## 2017-09-07 ENCOUNTER — Ambulatory Visit
Payer: Medicare Other | Attending: Student in an Organized Health Care Education/Training Program | Admitting: Physical Therapy

## 2017-09-07 DIAGNOSIS — M5442 Lumbago with sciatica, left side: Secondary | ICD-10-CM | POA: Diagnosis not present

## 2017-09-07 DIAGNOSIS — G8929 Other chronic pain: Secondary | ICD-10-CM

## 2017-09-07 DIAGNOSIS — R262 Difficulty in walking, not elsewhere classified: Secondary | ICD-10-CM | POA: Diagnosis not present

## 2017-09-07 DIAGNOSIS — R2681 Unsteadiness on feet: Secondary | ICD-10-CM | POA: Diagnosis not present

## 2017-09-07 DIAGNOSIS — R296 Repeated falls: Secondary | ICD-10-CM | POA: Diagnosis not present

## 2017-09-07 DIAGNOSIS — R2689 Other abnormalities of gait and mobility: Secondary | ICD-10-CM | POA: Insufficient documentation

## 2017-09-07 DIAGNOSIS — M25511 Pain in right shoulder: Secondary | ICD-10-CM | POA: Insufficient documentation

## 2017-09-07 NOTE — Therapy (Signed)
[place], [place], [address] Phone: [phone]   Fax:  [phone]  Physical Therapy Treatment  Patient Details  Name: Destiny Russell MRN: [id number] Date of Birth: 05-16-1950 Referring Provider: Dr Alphonzo Grieve    Encounter Date: 09/07/2017  PT End of Session - 09/07/17 1410    Visit Number  4    Number of Visits  16    Date for PT Re-Evaluation  12/16/17    Authorization Type  medicare     PT Start Time  1324    PT Stop Time  1447    PT Time Calculation (min)  42 min    Activity Tolerance  Patient tolerated treatment well    Behavior During Therapy  [facility] for tasks assessed/performed       Past Medical History:  Diagnosis Date  . Coronary atherosclerosis 04/13/2008   Cath in 2001 (for DOE) showed small vessel coronary disease; Myoview 2010 probable soft tissue attenuation but overall normal perfusion and normal LV function. An echocardiogram 2004 revealed normal LV function, mild LAE and mild MR. Myoview 2018 low risk.  Marland Kitchen GERD (gastroesophageal reflux disease)   . History of cataract surgery 03/01/2015  . Hyperlipidemia 04/13/2008   Declines statins - myalgias  . Hypertension   . Nail avulsion of toe 11/26/2015   right great toe - nontraumatic  . Parkinson's disease ([place])    questionable; patient endorses diagnosis; no symptoms  . Type 2 diabetes mellitus with proliferative retinopathy without macular edema ([place]) 04/13/2008   [facility]: Severe proliferative retinopathy with clinically significant macular edema, stable vitreous hemorrhage of left eye, and stable age-related nuclear cataract of both eyes.  08/09/14: [facility]: Bilateral rapidly progressive diabetic retinopathy with no macular edema     Past Surgical History:  Procedure Laterality Date  . CARDIAC CATHETERIZATION    . cataract    . CESAREAN SECTION      There were no vitals filed for  this visit.  Subjective Assessment - 09/07/17 1409    Subjective  Pt relays everything is doing better. She relays no pain upon arrival but still pain if she reaches too high. She relays the TENS is helping.    Pain Score  0-No pain    Multiple Pain Sites  No         [facility] PT Assessment - 09/07/17 0001      AROM   Right Shoulder Flexion  140 Degrees    Right Shoulder ABduction  105 Degrees                   [facility] Adult PT Treatment/Exercise - 09/07/17 1413      Exercises   Exercises  Lumbar;Shoulder      Lumbar Exercises: Stretches   Active Hamstring Stretch Limitations  seated 2x30 sec hold     Other Lumbar Stretch Exercise  seated ball roll out for flexion 5 sec X 15      Lumbar Exercises: Seated   Other Seated Lumbar Exercises  TAC with march X 15 bilat      Shoulder Exercises: Seated   Other Seated Exercises  seated cane flex, abd, ER AAROM X 15 ea      Shoulder Exercises: Standing   External Rotation  Right;15 reps    Theraband Level (Shoulder External Rotation)  Level 1 (Yellow)    Internal Rotation  Right;15 reps    Theraband Level (Shoulder  Internal Rotation)  Level 1 (Yellow)    Extension  Strengthening;Both;15 reps    Theraband Level (Shoulder Extension)  Level 2 (Red)    Row  Strengthening;Both;15 reps    Theraband Level (Shoulder Row)  Level 2 (Red)    Other Standing Exercises  wall slide at cabinet flex/scaption X10 ea      Shoulder Exercises: Pulleys   Flexion  2 minutes    ABduction  2 minutes      Modalities   Modalities  Electrical Stimulation;Moist Heat      Moist Heat Therapy   Number Minutes Moist Heat  10 Minutes    Moist Heat Location  Lumbar Spine;Shoulder      Electrical Stimulation   Electrical Stimulation Location  Rt shoulder    Electrical Stimulation Action  IFC 10 min    Electrical Stimulation Parameters  tolerance    Electrical Stimulation Goals  Pain      Manual Therapy   Manual therapy comments  --                PT Short Term Goals - 09/07/17 1442      PT SHORT TERM GOAL #1   Title  Patient will increase passive right shoulder flexion to 115 degrees     Time  4    Period  Weeks    Status  Achieved      PT SHORT TERM GOAL #2   Title  Patient will increase lumbar flexion by 20 degrees without pain or loss of balance     Time  4    Period  Weeks    Status  Achieved      PT SHORT TERM GOAL #3   Title  Patient will increase passive shoulder ER to 40 degrees     Time  4    Period  Weeks    Status  Achieved        PT Long Term Goals - 09/07/17 1442      PT LONG TERM GOAL #1   Title  Patient will increse bilateral lower extremity strength to 4+/5 in order to improve functional mobility     Time  8    Period  Weeks    Status  On-going      PT LONG TERM GOAL #2   Title  Patient will stand for 10 minutes without increased pain in order to perfrom her ADL's     Time  8    Period  Weeks    Status  On-going      PT LONG TERM GOAL #3   Title  Patient will demonstrate a 55% limitation     Time  8    Period  Weeks    Status  On-going            Plan - 09/07/17 1441    Clinical Impression Statement  Pt again had increased shouler AROM, see updated measurements. she has now met all STG. She was able to progress reps today and add in standing wall slides at cabinent with good tolrance but she does still have pain at end range. She was again treated with TENS and heat post tx as she relays this is helping.    Rehab Potential  Good    PT Frequency  2x / week    PT Duration  8 weeks    PT Treatment/Interventions  ADLs/Self Care Home Management;Cryotherapy;Occupational psychologist;Therapeutic activities;Therapeutic exercise;Patient/family education;Neuromuscular re-education;Manual techniques;Taping;Passive range of motion  PT Next Visit Plan  core, shoulder strenghtening and stretching, modalites and MT PRN    Consulted and Agree with Plan  of Care  Patient       Patient will benefit from skilled therapeutic intervention in order to improve the following deficits and impairments:  Pain, Postural dysfunction, Increased muscle spasms, Decreased activity tolerance, Decreased endurance, Decreased range of motion, Decreased strength, Impaired UE functional use  Visit Diagnosis: Chronic right shoulder pain  Chronic left-sided low back pain with left-sided sciatica  Difficulty in walking, not elsewhere classified     Problem List Patient Active Problem List   Diagnosis Date Noted  . Left-sided back pain 08/05/2017  . Chronic right shoulder pain 08/05/2017  . Current moderate episode of major depressive disorder without prior episode ([place]) 08/05/2017  . Chest pain on exertion 07/22/2016  . Dyspnea on exertion 06/04/2016  . Diabetic peripheral neuropathy ([place]) 06/04/2016  . Dizziness 05/07/2016  . GERD (gastroesophageal reflux disease) 10/28/2013  . Healthcare maintenance 09/07/2012  . Hyperlipidemia 04/13/2008  . Essential hypertension 04/13/2008  . Coronary atherosclerosis 04/13/2008  . Type 2 diabetes mellitus with proliferative retinopathy without macular edema ([place]) 04/13/2008    Debbe Odea, PT, DPT 09/07/2017, 2:44 PM  [facility] [address] [place], [place], [address] Phone: [phone]   Fax:  [phone]  Name: Destiny Russell MRN: [id number] Date of Birth: 27-Jan-1950

## 2017-09-09 ENCOUNTER — Ambulatory Visit: Payer: Medicare Other | Admitting: Physical Therapy

## 2017-09-09 DIAGNOSIS — R296 Repeated falls: Secondary | ICD-10-CM | POA: Diagnosis not present

## 2017-09-09 DIAGNOSIS — R2689 Other abnormalities of gait and mobility: Secondary | ICD-10-CM | POA: Diagnosis not present

## 2017-09-09 DIAGNOSIS — G8929 Other chronic pain: Secondary | ICD-10-CM | POA: Diagnosis not present

## 2017-09-09 DIAGNOSIS — R262 Difficulty in walking, not elsewhere classified: Secondary | ICD-10-CM | POA: Diagnosis not present

## 2017-09-09 DIAGNOSIS — M25511 Pain in right shoulder: Secondary | ICD-10-CM | POA: Diagnosis not present

## 2017-09-09 DIAGNOSIS — M5442 Lumbago with sciatica, left side: Secondary | ICD-10-CM

## 2017-09-09 NOTE — Therapy (Signed)
[facility] [address] [place], [place], [address] Phone: [phone]   Fax:  [phone]  Physical Therapy Treatment  Patient Details  Name: Destiny Russell MRN: [id number] Date of Birth: 1950-04-21 Referring Provider: Dr Nyra Market    Encounter Date: 09/09/2017  PT End of Session - 09/09/17 1624    Visit Number  5    Number of Visits  16    Date for PT Re-Evaluation  12/16/17    Authorization Type  medicare     PT Start Time  1500    PT Stop Time  1545    PT Time Calculation (min)  45 min    Activity Tolerance  Patient tolerated treatment well    Behavior During Therapy  [facility] for tasks assessed/performed       Past Medical History:  Diagnosis Date  . Coronary atherosclerosis 04/13/2008   Cath in 2001 (for DOE) showed small vessel coronary disease; Myoview 2010 probable soft tissue attenuation but overall normal perfusion and normal LV function. An echocardiogram 2004 revealed normal LV function, mild LAE and mild MR. Myoview 2018 low risk.  Marland Kitchen GERD (gastroesophageal reflux disease)   . History of cataract surgery 03/01/2015  . Hyperlipidemia 04/13/2008   Declines statins - myalgias  . Hypertension   . Nail avulsion of toe 11/26/2015   right great toe - nontraumatic  . Parkinson's disease (HCC)    questionable; patient endorses diagnosis; no symptoms  . Type 2 diabetes mellitus with proliferative retinopathy without macular edema (HCC) 04/13/2008   [facility]: Severe proliferative retinopathy with clinically significant macular edema, stable vitreous hemorrhage of left eye, and stable age-related nuclear cataract of both eyes.  08/09/14: [facility]: Bilateral rapidly progressive diabetic retinopathy with no macular edema     Past Surgical History:  Procedure Laterality Date  . CARDIAC CATHETERIZATION    . cataract    . CESAREAN SECTION      There were no vitals filed for  this visit.  Subjective Assessment - 09/09/17 1558    Subjective  Pt relays everything is improving. She relays no pain upon arrival but still pain if she reaches too high. She relays the TENS is helping.    Currently in Pain?  Yes    Pain Score  3     Pain Location  Shoulder    Pain Orientation  Right    Pain Descriptors / Indicators  Sharp    Multiple Pain Sites  No                       [facility] Adult PT Treatment/Exercise - 09/09/17 1608      Exercises   Exercises  Lumbar;Shoulder      Lumbar Exercises: Stretches   Active Hamstring Stretch Limitations  seated 2x30 sec hold     Other Lumbar Stretch Exercise  seated ball roll out for flexion 5 sec X 15      Lumbar Exercises: Seated   Other Seated Lumbar Exercises  TAC with march X 15 bilat      Shoulder Exercises: Seated   Other Seated Exercises  seated cane flex, abd, ER AAROM X 15 ea      Shoulder Exercises: Standing   External Rotation  Right;15 reps    Theraband Level (Shoulder External Rotation)  Level 1 (Yellow)    Internal Rotation  Right;15 reps    Theraband Level (Shoulder Internal Rotation)  Level 1 (Yellow)    Extension  Strengthening;Both;15 reps    Theraband Level (Shoulder Extension)  Level 2 (Red)    Row  Strengthening;Both;15 reps    Theraband Level (Shoulder Row)  Level 2 (Red)    Other Standing Exercises  wall slide at cabinet flex/scaption X10 ea      Shoulder Exercises: Pulleys   Flexion  2 minutes    ABduction  2 minutes      Modalities   Modalities  Electrical Stimulation;Moist Heat      Moist Heat Therapy   Number Minutes Moist Heat  12 Minutes    Moist Heat Location  Lumbar Spine;Shoulder      Electrical Stimulation   Electrical Stimulation Location  Rt shoulder    Electrical Stimulation Action  IFC 12 min    Electrical Stimulation Parameters  tolerance    Electrical Stimulation Goals  Pain               PT Short Term Goals - 09/07/17 1442      PT SHORT TERM  GOAL #1   Title  Patient will increase passive right shoulder flexion to 115 degrees     Time  4    Period  Weeks    Status  Achieved      PT SHORT TERM GOAL #2   Title  Patient will increase lumbar flexion by 20 degrees without pain or loss of balance     Time  4    Period  Weeks    Status  Achieved      PT SHORT TERM GOAL #3   Title  Patient will increase passive shoulder ER to 40 degrees     Time  4    Period  Weeks    Status  Achieved        PT Long Term Goals - 09/07/17 1442      PT LONG TERM GOAL #1   Title  Patient will increse bilateral lower extremity strength to 4+/5 in order to improve functional mobility     Time  8    Period  Weeks    Status  On-going      PT LONG TERM GOAL #2   Title  Patient will stand for 10 minutes without increased pain in order to perfrom her ADL's     Time  8    Period  Weeks    Status  On-going      PT LONG TERM GOAL #3   Title  Patient will demonstrate a 55% limitation     Time  8    Period  Weeks    Status  On-going            Plan - 09/09/17 1625    Clinical Impression Statement  Pt making steady progress in all areas. She had good tolerance to session today. MHP and TENS resumed post tx to reduce pain and tightness.    Rehab Potential  Good    PT Frequency  2x / week    PT Duration  8 weeks    PT Treatment/Interventions  ADLs/Self Care Home Management;Cryotherapy;Retail banker;Therapeutic activities;Therapeutic exercise;Patient/family education;Neuromuscular re-education;Manual techniques;Taping;Passive range of motion    PT Next Visit Plan  core, shoulder strenghtening and stretching, modalites and MT PRN    Consulted and Agree with Plan of Care  Patient       Patient will benefit from skilled therapeutic intervention in order to improve the following deficits and impairments:  Pain, Postural dysfunction, Increased muscle spasms, Decreased activity tolerance, Decreased  endurance, Decreased range of motion, Decreased strength, Impaired UE functional use  Visit Diagnosis: Chronic right shoulder pain  Chronic left-sided low back pain with left-sided sciatica     Problem List Patient Active Problem List   Diagnosis Date Noted  . Left-sided back pain 08/05/2017  . Chronic right shoulder pain 08/05/2017  . Current moderate episode of major depressive disorder without prior episode (HCC) 08/05/2017  . Chest pain on exertion 07/22/2016  . Dyspnea on exertion 06/04/2016  . Diabetic peripheral neuropathy (HCC) 06/04/2016  . Dizziness 05/07/2016  . GERD (gastroesophageal reflux disease) 10/28/2013  . Healthcare maintenance 09/07/2012  . Hyperlipidemia 04/13/2008  . Essential hypertension 04/13/2008  . Coronary atherosclerosis 04/13/2008  . Type 2 diabetes mellitus with proliferative retinopathy without macular edema (HCC) 04/13/2008    April Manson, PT, DPT 09/09/2017, 4:27 PM  [facility] [address] [place], [place], [address] Phone: [phone]   Fax:  [phone]  Name: Destiny Russell MRN: [id number] Date of Birth: [birth year]

## 2017-09-14 ENCOUNTER — Encounter: Payer: Self-pay | Admitting: Physical Therapy

## 2017-09-14 ENCOUNTER — Ambulatory Visit: Payer: Medicare Other | Admitting: Physical Therapy

## 2017-09-14 DIAGNOSIS — R262 Difficulty in walking, not elsewhere classified: Secondary | ICD-10-CM

## 2017-09-14 DIAGNOSIS — G8929 Other chronic pain: Secondary | ICD-10-CM

## 2017-09-14 DIAGNOSIS — M25511 Pain in right shoulder: Principal | ICD-10-CM

## 2017-09-14 DIAGNOSIS — M5442 Lumbago with sciatica, left side: Secondary | ICD-10-CM

## 2017-09-14 DIAGNOSIS — R2689 Other abnormalities of gait and mobility: Secondary | ICD-10-CM | POA: Diagnosis not present

## 2017-09-14 DIAGNOSIS — R296 Repeated falls: Secondary | ICD-10-CM | POA: Diagnosis not present

## 2017-09-15 ENCOUNTER — Encounter: Payer: Self-pay | Admitting: Physical Therapy

## 2017-09-15 NOTE — Therapy (Signed)
[facility] [address] [place], [place], [address] Phone: [phone]   Fax:  [phone]  Physical Therapy Treatment  Patient Details  Name: Destiny Russell MRN: [id number] Date of Birth: [birth year] Referring Provider: Dr Nyra Market    Encounter Date: 09/14/2017  PT End of Session - 09/15/17 1737    Visit Number  6    Number of Visits  16    Date for PT Re-Evaluation  12/16/17    Authorization Type  medicare     PT Start Time  1415    PT Stop Time  1456    PT Time Calculation (min)  41 min    Activity Tolerance  Patient tolerated treatment well    Behavior During Therapy  [facility] for tasks assessed/performed       Past Medical History:  Diagnosis Date  . Coronary atherosclerosis 04/13/2008   Cath in 2001 (for DOE) showed small vessel coronary disease; Myoview 2010 probable soft tissue attenuation but overall normal perfusion and normal LV function. An echocardiogram 2004 revealed normal LV function, mild LAE and mild MR. Myoview 2018 low risk.  Marland Kitchen GERD (gastroesophageal reflux disease)   . History of cataract surgery 03/01/2015  . Hyperlipidemia 04/13/2008   Declines statins - myalgias  . Hypertension   . Nail avulsion of toe 11/26/2015   right great toe - nontraumatic  . Parkinson's disease (HCC)    questionable; patient endorses diagnosis; no symptoms  . Type 2 diabetes mellitus with proliferative retinopathy without macular edema (HCC) 04/13/2008   06/06/14 [facility]: Severe proliferative retinopathy with clinically significant macular edema, stable vitreous hemorrhage of left eye, and stable age-related nuclear cataract of both eyes.  08/09/14: [facility]: Bilateral rapidly progressive diabetic retinopathy with no macular edema     Past Surgical History:  Procedure Laterality Date  . CARDIAC CATHETERIZATION    . cataract    . CESAREAN SECTION      There were no vitals filed for  this visit.  Subjective Assessment - 09/14/17 1421    Subjective  Patients shoulder is not hurting her today. her shoulder is only hurting her when she is reaching behind her head. Her back is only hurting her when she is doing housework.     How long can you sit comfortably?  depends but has to shift often     How long can you stand comfortably?  >5 min     How long can you walk comfortably?  limited community distances with a cane     Diagnostic tests  nothing recent in the shoulder and hip     Patient Stated Goals  to have less pain     Currently in Pain?  No/denies                       [facility] Adult PT Treatment/Exercise - 09/15/17 0001      Lumbar Exercises: Seated   Other Seated Lumbar Exercises  TAC with march X 15 bilat      Shoulder Exercises: Standing   External Rotation  Right;15 reps    Theraband Level (Shoulder External Rotation)  Level 1 (Yellow)    Internal Rotation  Right;15 reps    Theraband Level (Shoulder Internal Rotation)  Level 1 (Yellow)    Extension  Strengthening;Both;15 reps    Theraband Level (Shoulder Extension)  Level 2 (Red)    Row  Strengthening;Both;15 reps  Theraband Level (Shoulder Row)  Level 2 (Red)    Other Standing Exercises  wall slide at cabinet flex/scaption X10 ea      Modalities   Modalities  Electrical Stimulation;Moist Heat      Moist Heat Therapy   Number Minutes Moist Heat  12 Minutes    Moist Heat Location  Lumbar Spine;Shoulder      Electrical Stimulation   Electrical Stimulation Location  Rt shoulder    Electrical Stimulation Action  IFC 12 min     Electrical Stimulation Parameters  tolerance     Electrical Stimulation Goals  Pain      Manual Therapy   Manual Therapy  Soft tissue mobilization;Joint mobilization    Joint Mobilization  Grade I and II posterior and anterior glides to improve flexion, IR and ER    Soft tissue mobilization  to posterior shoulder and upper trap              PT Education  - 09/14/17 1447    Education Details  reviewed tehcnique with strethching     Person(s) Educated  Patient    Methods  Explanation;Demonstration;Tactile cues;Verbal cues    Comprehension  Verbalized understanding;Returned demonstration;Verbal cues required;Tactile cues required;Need further instruction       PT Short Term Goals - 09/07/17 1442      PT SHORT TERM GOAL #1   Title  Patient will increase passive right shoulder flexion to 115 degrees     Time  4    Period  Weeks    Status  Achieved      PT SHORT TERM GOAL #2   Title  Patient will increase lumbar flexion by 20 degrees without pain or loss of balance     Time  4    Period  Weeks    Status  Achieved      PT SHORT TERM GOAL #3   Title  Patient will increase passive shoulder ER to 40 degrees     Time  4    Period  Weeks    Status  Achieved        PT Long Term Goals - 09/07/17 1442      PT LONG TERM GOAL #1   Title  Patient will increse bilateral lower extremity strength to 4+/5 in order to improve functional mobility     Time  8    Period  Weeks    Status  On-going      PT LONG TERM GOAL #2   Title  Patient will stand for 10 minutes without increased pain in order to perfrom her ADL's     Time  8    Period  Weeks    Status  On-going      PT LONG TERM GOAL #3   Title  Patient will demonstrate a 55% limitation     Time  8    Period  Weeks    Status  On-going            Plan - 09/15/17 1738    Clinical Impression Statement  Patient continues to have tightness in her shoulder at end ranges. Therapy focusedon manual therapy to improve flexion, IR and ER. Per visual inspection the patient had improved motion. Therapy reviewed self stretching to help maintain motion. Over all she is having much less pain in her shoulder and back.     Clinical Presentation  Evolving    Clinical Decision Making  Moderate    Rehab Potential  Good    PT Frequency  2x / week    PT Duration  8 weeks    PT  Treatment/Interventions  ADLs/Self Care Home Management;Cryotherapy;Retail banker;Therapeutic activities;Therapeutic exercise;Patient/family education;Neuromuscular re-education;Manual techniques;Taping;Passive range of motion    PT Next Visit Plan  core, shoulder strenghtening and stretching, modalites and MT PRN    Consulted and Agree with Plan of Care  Patient       Patient will benefit from skilled therapeutic intervention in order to improve the following deficits and impairments:  Pain, Postural dysfunction, Increased muscle spasms, Decreased activity tolerance, Decreased endurance, Decreased range of motion, Decreased strength, Impaired UE functional use  Visit Diagnosis: Chronic right shoulder pain  Chronic left-sided low back pain with left-sided sciatica  Difficulty in walking, not elsewhere classified     Problem List Patient Active Problem List   Diagnosis Date Noted  . Left-sided back pain 08/05/2017  . Chronic right shoulder pain 08/05/2017  . Current moderate episode of major depressive disorder without prior episode (HCC) 08/05/2017  . Chest pain on exertion 07/22/2016  . Dyspnea on exertion 06/04/2016  . Diabetic peripheral neuropathy (HCC) 06/04/2016  . Dizziness 05/07/2016  . GERD (gastroesophageal reflux disease) 10/28/2013  . Healthcare maintenance 09/07/2012  . Hyperlipidemia 04/13/2008  . Essential hypertension 04/13/2008  . Coronary atherosclerosis 04/13/2008  . Type 2 diabetes mellitus with proliferative retinopathy without macular edema (HCC) 04/13/2008    Dessie Coma PT DPT  09/15/2017, 5:42 PM  [facility] [address] [place], [place], [address] Phone: [phone]   Fax:  [phone]  Name: Destiny Russell MRN: [id number] Date of Birth: [birth year]

## 2017-09-16 ENCOUNTER — Ambulatory Visit: Payer: Medicare Other | Admitting: Physical Therapy

## 2017-09-16 ENCOUNTER — Encounter: Payer: Self-pay | Admitting: Physical Therapy

## 2017-09-16 VITALS — BP 171/94 | HR 74

## 2017-09-16 DIAGNOSIS — R2689 Other abnormalities of gait and mobility: Secondary | ICD-10-CM

## 2017-09-16 DIAGNOSIS — M25511 Pain in right shoulder: Principal | ICD-10-CM

## 2017-09-16 DIAGNOSIS — R2681 Unsteadiness on feet: Secondary | ICD-10-CM

## 2017-09-16 DIAGNOSIS — R262 Difficulty in walking, not elsewhere classified: Secondary | ICD-10-CM | POA: Diagnosis not present

## 2017-09-16 DIAGNOSIS — R296 Repeated falls: Secondary | ICD-10-CM | POA: Diagnosis not present

## 2017-09-16 DIAGNOSIS — G8929 Other chronic pain: Secondary | ICD-10-CM | POA: Diagnosis not present

## 2017-09-16 DIAGNOSIS — M5442 Lumbago with sciatica, left side: Secondary | ICD-10-CM

## 2017-09-16 NOTE — Patient Instructions (Signed)
Call MD about BP

## 2017-09-16 NOTE — Therapy (Signed)
[facility] [address] [place], [place], [address] Phone: [phone]   Fax:  [phone]  Physical Therapy Treatment  Patient Details  Name: Destiny Russell MRN: [id number] Date of Birth: 10/29/1950 Referring Provider: Dr Nyra Market    Encounter Date: 09/16/2017  PT End of Session - 09/16/17 1507    Visit Number  7    Number of Visits  16    Date for PT Re-Evaluation  12/16/17    PT Start Time  1417    PT Stop Time  1501    PT Time Calculation (min)  44 min    Activity Tolerance  Patient tolerated treatment well    Behavior During Therapy  [facility] for tasks assessed/performed       Past Medical History:  Diagnosis Date  . Coronary atherosclerosis 04/13/2008   Cath in 2001 (for DOE) showed small vessel coronary disease; Myoview 2010 probable soft tissue attenuation but overall normal perfusion and normal LV function. An echocardiogram 2004 revealed normal LV function, mild LAE and mild MR. Myoview 2018 low risk.  Marland Kitchen GERD (gastroesophageal reflux disease)   . History of cataract surgery 03/01/2015  . Hyperlipidemia 04/13/2008   Declines statins - myalgias  . Hypertension   . Nail avulsion of toe 11/26/2015   right great toe - nontraumatic  . Parkinson's disease (HCC)    questionable; patient endorses diagnosis; no symptoms  . Type 2 diabetes mellitus with proliferative retinopathy without macular edema (HCC) 04/13/2008   [facility]14 [facility]: Severe proliferative retinopathy with clinically significant macular edema, stable vitreous hemorrhage of left eye, and stable age-related nuclear cataract of both eyes.  08/09/14: [facility]: Bilateral rapidly progressive diabetic retinopathy with no macular edema     Past Surgical History:  Procedure Laterality Date  . CARDIAC CATHETERIZATION    . cataract    . CESAREAN SECTION      Vitals:   09/16/17 1442  BP: (!) 171/94  Pulse: 74     Subjective Assessment - 09/16/17 1424    Subjective  PT has helped my shoulder move more and my back is a lot better.  Legs sore today due to cleaning out the closet.  She is doing the exercises at home.     Patient is accompained by:  Family member    Currently in Pain?  No/denies    Pain Location  Shoulder    Pain Orientation  Right    Pain Descriptors / Indicators  Sore    Effect of Pain on Daily Activities  sweeping    Multiple Pain Sites  --   legs are sore from cleaning closet.     Pain Score  0    Pain Location  Back    Pain Frequency  Intermittent    Aggravating Factors   sweeping    Pain Relieving Factors  exercise                       [facility] Adult PT Treatment/Exercise - 09/16/17 0001      Exercises   Exercises  Neck      Lumbar Exercises: Stretches   Gastroc Stretch  3 reps;30 seconds    Gastroc Stretch Limitations  incline,  toes only      Lumbar Exercises: Aerobic   Nustep  L 4 UE/LE  5 minutes      Lumbar Exercises: Standing   Heel Raises  10 reps  Heel Raises Limitations  also toe lifts,  small motions    Other Standing Lumbar Exercises  hip extension 10 X each   right back 6/10 pain cued abdominal bracing     Lumbar Exercises: Seated   Other Seated Lumbar Exercises  hamstring curls 10 X each green band    Other Seated Lumbar Exercises  Abdominal isometrics with hands on thighs 5 X  cued breathing      Shoulder Exercises: Seated   Retraction Limitations  10 X yellow band right    External Rotation  10 reps    Theraband Level (Shoulder External Rotation)  Level 1 (Yellow)    Internal Rotation  10 reps    Theraband Level (Shoulder Internal Rotation)  Level 1 (Yellow)      Shoulder Exercises: Standing   Other Standing Exercises  self mobs:  roll under arm  posterior adduction  and distraction.  manual anterior shoulder glides to tissue for lateral mobility.    able to increase reach from lateral hip to mid gluteal.       Shoulder  Exercises: Pulleys   Flexion  2 minutes    ABduction  2 minutes             PT Education - 09/16/17 1506    Education Details  exercise form.    Person(s) Educated  Patient    Methods  Explanation;Demonstration;Verbal cues    Comprehension  Verbalized understanding;Returned demonstration       PT Short Term Goals - 09/07/17 1442      PT SHORT TERM GOAL #1   Title  Patient will increase passive right shoulder flexion to 115 degrees     Time  4    Period  Weeks    Status  Achieved      PT SHORT TERM GOAL #2   Title  Patient will increase lumbar flexion by 20 degrees without pain or loss of balance     Time  4    Period  Weeks    Status  Achieved      PT SHORT TERM GOAL #3   Title  Patient will increase passive shoulder ER to 40 degrees     Time  4    Period  Weeks    Status  Achieved        PT Long Term Goals - 09/16/17 1510      PT LONG TERM GOAL #1   Title  Patient will increse bilateral lower extremity strength to 4+/5 in order to improve functional mobility     Baseline  beginning LE strengthening , not tested    Time  8    Period  Weeks    Status  On-going      PT LONG TERM GOAL #2   Title  Patient will stand for 10 minutes without increased pain in order to perfrom her ADL's     Baseline  standing time limited.  Time not estimated.    Time  8    Period  Weeks    Status  On-going      PT LONG TERM GOAL #3   Title  Patient will demonstrate a 55% limitation     Time  8    Period  Weeks    Status  Unable to assess            Plan - 09/16/17 1507    Clinical Impression Statement  Pain improving with home tasks.  She was able to  increase reach posterior lateral hip to mid gluteal with self mobs. Strengthening focus today vs pain management.    PT Next Visit Plan  core, shoulder strenghtening and stretching, modalites and MT PRN.  continue LE strength/  consider hamstring stretch/ gastroc stretch.    Consulted and Agree with Plan of Care   Patient       Patient will benefit from skilled therapeutic intervention in order to improve the following deficits and impairments:     Visit Diagnosis: Chronic right shoulder pain  Chronic left-sided low back pain with left-sided sciatica  Difficulty in walking, not elsewhere classified  Repeated falls  Other abnormalities of gait and mobility  Unsteadiness on feet     Problem List Patient Active Problem List   Diagnosis Date Noted  . Left-sided back pain 08/05/2017  . Chronic right shoulder pain 08/05/2017  . Current moderate episode of major depressive disorder without prior episode (HCC) 08/05/2017  . Chest pain on exertion 07/22/2016  . Dyspnea on exertion 06/04/2016  . Diabetic peripheral neuropathy (HCC) 06/04/2016  . Dizziness 05/07/2016  . GERD (gastroesophageal reflux disease) 10/28/2013  . Healthcare maintenance 09/07/2012  . Hyperlipidemia 04/13/2008  . Essential hypertension 04/13/2008  . Coronary atherosclerosis 04/13/2008  . Type 2 diabetes mellitus with proliferative retinopathy without macular edema (HCC) 04/13/2008    Vickki Igou  PTA 09/16/2017, 3:17 PM  [facility] [address] [place], [place], [address] Phone: [phone]   Fax:  [phone]  Name: Destiny Russell MRN: [id number] Date of Birth: 1950/10/09

## 2017-09-21 ENCOUNTER — Ambulatory Visit: Payer: Medicare Other | Admitting: Physical Therapy

## 2017-09-21 ENCOUNTER — Encounter: Payer: Self-pay | Admitting: Physical Therapy

## 2017-09-21 DIAGNOSIS — M5442 Lumbago with sciatica, left side: Secondary | ICD-10-CM

## 2017-09-21 DIAGNOSIS — R296 Repeated falls: Secondary | ICD-10-CM | POA: Diagnosis not present

## 2017-09-21 DIAGNOSIS — R262 Difficulty in walking, not elsewhere classified: Secondary | ICD-10-CM

## 2017-09-21 DIAGNOSIS — R2689 Other abnormalities of gait and mobility: Secondary | ICD-10-CM | POA: Diagnosis not present

## 2017-09-21 DIAGNOSIS — M25511 Pain in right shoulder: Principal | ICD-10-CM

## 2017-09-21 DIAGNOSIS — G8929 Other chronic pain: Secondary | ICD-10-CM

## 2017-09-22 ENCOUNTER — Encounter: Payer: Self-pay | Admitting: Physical Therapy

## 2017-09-22 NOTE — Therapy (Signed)
[facility] [address] [place], [place], [address] Phone: [phone]   Fax:  [phone]  Physical Therapy Treatment  Patient Details  Name: Cyprus M Tumlin MRN: [id number] Date of Birth: 03-Feb-1950 Referring Provider: Dr Nyra Market    Encounter Date: 09/21/2017  PT End of Session - 09/21/17 1438    Visit Number  8    Number of Visits  16    Date for PT Re-Evaluation  12/16/17    Authorization Type  medicare     PT Start Time  1417    PT Stop Time  1458    PT Time Calculation (min)  41 min    Activity Tolerance  Patient tolerated treatment well    Behavior During Therapy  [facility] for tasks assessed/performed       Past Medical History:  Diagnosis Date  . Coronary atherosclerosis 04/13/2008   Cath in 2001 (for DOE) showed small vessel coronary disease; Myoview 2010 probable soft tissue attenuation but overall normal perfusion and normal LV function. An echocardiogram 2004 revealed normal LV function, mild LAE and mild MR. Myoview 2018 low risk.  Marland Kitchen GERD (gastroesophageal reflux disease)   . History of cataract surgery 03/01/2015  . Hyperlipidemia 04/13/2008   Declines statins - myalgias  . Hypertension   . Nail avulsion of toe 11/26/2015   right great toe - nontraumatic  . Parkinson's disease (HCC)    questionable; patient endorses diagnosis; no symptoms  . Type 2 diabetes mellitus with proliferative retinopathy without macular edema (HCC) 04/13/2008   [facility]14 [facility]: Severe proliferative retinopathy with clinically significant macular edema, stable vitreous hemorrhage of left eye, and stable age-related nuclear cataract of both eyes.  08/09/14: [facility]: Bilateral rapidly progressive diabetic retinopathy with no macular edema     Past Surgical History:  Procedure Laterality Date  . CARDIAC CATHETERIZATION    . cataract    . CESAREAN SECTION      There were no vitals filed for  this visit.  Subjective Assessment - 09/21/17 1420    Subjective  Patient reports her shoulder hurts her when she trys to reach behind her head. Her back is mostly hurting when she does too much activity.     Patient is accompained by:  Family member    Limitations  Standing;Walking    How long can you sit comfortably?  depends but has to shift often     How long can you stand comfortably?  >5 min     How long can you walk comfortably?  limited community distances with a cane     Diagnostic tests  nothing recent in the shoulder and hip     Patient Stated Goals  to have less pain     Currently in Pain?  Yes    Pain Score  3     Pain Location  Shoulder    Pain Orientation  Right    Pain Onset  More than a month ago    Pain Frequency  Constant    Multiple Pain Sites  Yes    Pain Score  1    Pain Location  Back    Pain Orientation  Left;Lower    Pain Descriptors / Indicators  Aching    Pain Onset  More than a month ago    Pain Frequency  Intermittent    Aggravating Factors   sweeping     Pain Relieving Factors  difficulty perfroming ADL's  [facility] Adult PT Treatment/Exercise - 09/22/17 0001      Exercises   Exercises  Shoulder      Lumbar Exercises: Stretches   Active Hamstring Stretch Limitations  seated 2x30 sec hold     Lower Trunk Rotation Limitations  x10      Shoulder Exercises: Supine   Other Supine Exercises  supine bilateral shoulder flexion wand x10; supine shoulder ER x10       Shoulder Exercises: Seated   Retraction Limitations  10 X yellow band right    External Rotation  20 reps    Theraband Level (Shoulder External Rotation)  Level 1 (Yellow)    Internal Rotation  20 reps    Theraband Level (Shoulder Internal Rotation)  Level 1 (Yellow)    Other Seated Exercises  shoulder extnesions 2x10 yellow       Shoulder Exercises: Sidelying   Other Sidelying Exercises  sidelying ER x10             PT Education - 09/21/17  1437    Education Details  reviewed hom exercise program     Person(s) Educated  Patient    Methods  Explanation;Demonstration;Tactile cues;Verbal cues    Comprehension  Verbalized understanding;Returned demonstration;Verbal cues required;Tactile cues required       PT Short Term Goals - 09/22/17 0803      PT SHORT TERM GOAL #1   Title  Patient will increase passive right shoulder flexion to 115 degrees     Time  4    Period  Weeks    Status  Achieved      PT SHORT TERM GOAL #2   Title  Patient will increase lumbar flexion by 20 degrees without pain or loss of balance     Time  4    Period  Weeks    Status  Achieved      PT SHORT TERM GOAL #3   Title  Patient will increase passive shoulder ER to 40 degrees     Time  4    Period  Weeks    Status  Achieved        PT Long Term Goals - 09/16/17 1510      PT LONG TERM GOAL #1   Title  Patient will increse bilateral lower extremity strength to 4+/5 in order to improve functional mobility     Baseline  beginning LE strengthening , not tested    Time  8    Period  Weeks    Status  On-going      PT LONG TERM GOAL #2   Title  Patient will stand for 10 minutes without increased pain in order to perfrom her ADL's     Baseline  standing time limited.  Time not estimated.    Time  8    Period  Weeks    Status  On-going      PT LONG TERM GOAL #3   Title  Patient will demonstrate a 55% limitation     Time  8    Period  Weeks    Status  Unable to assess            Plan - 09/21/17 1439    Clinical Impression Statement  Patient tolerated treatment well. She had no increase in pain with exercises. Her range improved with mobilizations and stretching. Therapy reviewed exercises she should be doing at home. She would like to work on her balance some as well because she still feels unsteady  on her feet.    Clinical Presentation  Evolving    Clinical Decision Making  Moderate    Rehab Potential  Good    PT Frequency  2x /  week    PT Duration  8 weeks    PT Treatment/Interventions  ADLs/Self Care Home Management;Cryotherapy;Retail banker;Therapeutic activities;Therapeutic exercise;Patient/family education;Neuromuscular re-education;Manual techniques;Taping;Passive range of motion    PT Next Visit Plan  core, shoulder strenghtening and stretching, modalites and MT PRN.  continue LE strength/  consider hamstring stretch/ gastroc stretch.    Consulted and Agree with Plan of Care  Patient       Patient will benefit from skilled therapeutic intervention in order to improve the following deficits and impairments:  Pain, Postural dysfunction, Increased muscle spasms, Decreased activity tolerance, Decreased endurance, Decreased range of motion, Decreased strength, Impaired UE functional use  Visit Diagnosis: Chronic right shoulder pain  Chronic left-sided low back pain with left-sided sciatica  Difficulty in walking, not elsewhere classified     Problem List Patient Active Problem List   Diagnosis Date Noted  . Left-sided back pain 08/05/2017  . Chronic right shoulder pain 08/05/2017  . Current moderate episode of major depressive disorder without prior episode (HCC) 08/05/2017  . Chest pain on exertion 07/22/2016  . Dyspnea on exertion 06/04/2016  . Diabetic peripheral neuropathy (HCC) 06/04/2016  . Dizziness 05/07/2016  . GERD (gastroesophageal reflux disease) 10/28/2013  . Healthcare maintenance 09/07/2012  . Hyperlipidemia 04/13/2008  . Essential hypertension 04/13/2008  . Coronary atherosclerosis 04/13/2008  . Type 2 diabetes mellitus with proliferative retinopathy without macular edema (HCC) 04/13/2008    Dessie Coma PT DPT  09/22/2017, 8:22 AM  [facility] [address] [place], [place], [address] Phone: [phone]   Fax:  [phone]  Name: Cyprus M Olliff MRN: [id number] Date of Birth:  12-Feb-1950

## 2017-09-23 ENCOUNTER — Encounter: Payer: Self-pay | Admitting: Physical Therapy

## 2017-09-23 ENCOUNTER — Ambulatory Visit: Payer: Medicare Other | Admitting: Physical Therapy

## 2017-09-23 DIAGNOSIS — R262 Difficulty in walking, not elsewhere classified: Secondary | ICD-10-CM | POA: Diagnosis not present

## 2017-09-23 DIAGNOSIS — G8929 Other chronic pain: Secondary | ICD-10-CM

## 2017-09-23 DIAGNOSIS — R2689 Other abnormalities of gait and mobility: Secondary | ICD-10-CM | POA: Diagnosis not present

## 2017-09-23 DIAGNOSIS — M5442 Lumbago with sciatica, left side: Secondary | ICD-10-CM

## 2017-09-23 DIAGNOSIS — M25511 Pain in right shoulder: Principal | ICD-10-CM

## 2017-09-23 DIAGNOSIS — R296 Repeated falls: Secondary | ICD-10-CM | POA: Diagnosis not present

## 2017-09-23 NOTE — Therapy (Signed)
[facility] [address] [place], [place], [address] Phone: [phone]   Fax:  [phone]  Physical Therapy Treatment  Patient Details  Name: Destiny Russell MRN: [id number] Date of Birth: 04-Jun-1950 Referring Provider: Dr Nyra Market    Encounter Date: 09/23/2017  PT End of Session - 09/23/17 1441    Visit Number  9    Number of Visits  16    Date for PT Re-Evaluation  12/16/17    Authorization Type  medicare     PT Start Time  1415    PT Stop Time  1458    PT Time Calculation (min)  43 min    Activity Tolerance  Patient tolerated treatment well    Behavior During Therapy  [facility] for tasks assessed/performed       Past Medical History:  Diagnosis Date  . Coronary atherosclerosis 04/13/2008   Cath in 2001 (for DOE) showed small vessel coronary disease; Myoview 2010 probable soft tissue attenuation but overall normal perfusion and normal LV function. An echocardiogram 2004 revealed normal LV function, mild LAE and mild MR. Myoview 2018 low risk.  Marland Kitchen GERD (gastroesophageal reflux disease)   . History of cataract surgery 03/01/2015  . Hyperlipidemia 04/13/2008   Declines statins - myalgias  . Hypertension   . Nail avulsion of toe 11/26/2015   right great toe - nontraumatic  . Parkinson's disease (HCC)    questionable; patient endorses diagnosis; no symptoms  . Type 2 diabetes mellitus with proliferative retinopathy without macular edema (HCC) 04/13/2008   [facility]: Severe proliferative retinopathy with clinically significant macular edema, stable vitreous hemorrhage of left eye, and stable age-related nuclear cataract of both eyes.  08/09/14: [facility]: Bilateral rapidly progressive diabetic retinopathy with no macular edema     Past Surgical History:  Procedure Laterality Date  . CARDIAC CATHETERIZATION    . cataract    . CESAREAN SECTION      There were no vitals filed for  this visit.  Subjective Assessment - 09/23/17 1421    Subjective  Patient reports a little soreness in her shoulder after yesterday but it went away. She is having no pain today in her shoulder. She had momentary pain in her back getting up on the table but it went away when she was up on the table.     Limitations  Standing;Walking    How long can you sit comfortably?  depends but has to shift often     How long can you stand comfortably?  >5 min     How long can you walk comfortably?  limited community distances with a cane     Diagnostic tests  nothing recent in the shoulder and hip     Patient Stated Goals  to have less pain     Currently in Pain?  No/denies                       [facility] Adult PT Treatment/Exercise - 09/23/17 0001      High Level Balance   High Level Balance Comments  narrow base of support eyes open 2x30 sec no lob eyes closed 2x30 sec CGA; tandem stance 2x30sec each way min a bilateral for balance       Lumbar Exercises: Stretches   Active Hamstring Stretch Limitations  seated 2x30 sec hold     Lower Trunk Rotation Limitations  x10  Shoulder Exercises: Supine   Other Supine Exercises  supine wand flexion x15; supine wand external rotation x15       Shoulder Exercises: Seated   Retraction Limitations  2x10 yellow band right    Other Seated Exercises  shoulder extensions 2x10 yellow       Shoulder Exercises: Sidelying   Other Sidelying Exercises  sidelying ER x10      Manual Therapy   Manual therapy comments  PROM into all planes of the shoulder     Joint Mobilization  Grade I and II posterior and anterior glides to improve flexion, IR and ER             PT Education - 09/23/17 1440    Education Details  reviewed self stretching     Person(s) Educated  Patient    Methods  Explanation;Demonstration;Tactile cues;Verbal cues    Comprehension  Verbalized understanding;Returned demonstration;Verbal cues required;Tactile cues required        PT Short Term Goals - 09/22/17 0803      PT SHORT TERM GOAL #1   Title  Patient will increase passive right shoulder flexion to 115 degrees     Time  4    Period  Weeks    Status  Achieved      PT SHORT TERM GOAL #2   Title  Patient will increase lumbar flexion by 20 degrees without pain or loss of balance     Time  4    Period  Weeks    Status  Achieved      PT SHORT TERM GOAL #3   Title  Patient will increase passive shoulder ER to 40 degrees     Time  4    Period  Weeks    Status  Achieved        PT Long Term Goals - 09/16/17 1510      PT LONG TERM GOAL #1   Title  Patient will increse bilateral lower extremity strength to 4+/5 in order to improve functional mobility     Baseline  beginning LE strengthening , not tested    Time  8    Period  Weeks    Status  On-going      PT LONG TERM GOAL #2   Title  Patient will stand for 10 minutes without increased pain in order to perfrom her ADL's     Baseline  standing time limited.  Time not estimated.    Time  8    Period  Weeks    Status  On-going      PT LONG TERM GOAL #3   Title  Patient will demonstrate a 55% limitation     Time  8    Period  Weeks    Status  Unable to assess            Plan - 09/23/17 1442    Clinical Impression Statement  Patient is making good progress. Per visual inspection her shoulder range is improving. Therapy reviewed back strengthening exercises. She flet a little pulling with the back exercises but overall she tolerated well. Therapy added in light balance exercises today. Her     Clinical Presentation  Evolving    Clinical Decision Making  Moderate    Rehab Potential  Good    PT Frequency  2x / week    PT Duration  8 weeks    PT Treatment/Interventions  ADLs/Self Care Home Management;Cryotherapy;Retail banker;Therapeutic activities;Therapeutic exercise;Patient/family education;Neuromuscular re-education;Manual  techniques;Taping;Passive range of motion    PT Next Visit Plan  core, shoulder strenghtening and stretching, modalites and MT PRN.  continue LE strength/  consider hamstring stretch/ gastroc stretch.    Consulted and Agree with Plan of Care  Patient       Patient will benefit from skilled therapeutic intervention in order to improve the following deficits and impairments:  Pain, Postural dysfunction, Increased muscle spasms, Decreased activity tolerance, Decreased endurance, Decreased range of motion, Decreased strength, Impaired UE functional use  Visit Diagnosis: Chronic right shoulder pain  Chronic left-sided low back pain with left-sided sciatica  Difficulty in walking, not elsewhere classified     Problem List Patient Active Problem List   Diagnosis Date Noted  . Left-sided back pain 08/05/2017  . Chronic right shoulder pain 08/05/2017  . Current moderate episode of major depressive disorder without prior episode (HCC) 08/05/2017  . Chest pain on exertion 07/22/2016  . Dyspnea on exertion 06/04/2016  . Diabetic peripheral neuropathy (HCC) 06/04/2016  . Dizziness 05/07/2016  . GERD (gastroesophageal reflux disease) 10/28/2013  . Healthcare maintenance 09/07/2012  . Hyperlipidemia 04/13/2008  . Essential hypertension 04/13/2008  . Coronary atherosclerosis 04/13/2008  . Type 2 diabetes mellitus with proliferative retinopathy without macular edema (HCC) 04/13/2008    Dessie Comaavid J Lexxus Underhill PT DPT  09/23/2017, 3:45 PM  [facility] [address] [place], [place], [address] Phone: [phone]   Fax:  [phone]  Name: Destiny Russell MRN: [id number] Date of Birth: [birth year]

## 2017-09-28 ENCOUNTER — Telehealth: Payer: Self-pay | Admitting: *Deleted

## 2017-09-28 NOTE — Telephone Encounter (Signed)
Geriatric Task Force call: I asked about her blood sugars. Stated they are "pretty good". Stated she had to stop taking Victoza after 2 weeks  b/c it made her sick "made me sick on my stomach".Stated yesterday morning, it was 70. I asked about her blood pressure - stated it has been staying high. And she has been taking he medication. It has been 147/70 but it has been as high as 190's/ 90. Stated she has been going to rehab. Reminded pt about her appt tomorrow with Dr Samuella CotaSvalina. Stated she will be here

## 2017-09-29 ENCOUNTER — Encounter: Payer: Self-pay | Admitting: Internal Medicine

## 2017-09-29 ENCOUNTER — Other Ambulatory Visit: Payer: Self-pay

## 2017-09-29 ENCOUNTER — Ambulatory Visit (INDEPENDENT_AMBULATORY_CARE_PROVIDER_SITE_OTHER): Payer: Medicare Other | Admitting: Internal Medicine

## 2017-09-29 VITALS — BP 146/64 | HR 74 | Temp 98.4°F | Ht 61.0 in | Wt 199.2 lb

## 2017-09-29 DIAGNOSIS — F17220 Nicotine dependence, chewing tobacco, uncomplicated: Secondary | ICD-10-CM | POA: Diagnosis not present

## 2017-09-29 DIAGNOSIS — E1142 Type 2 diabetes mellitus with diabetic polyneuropathy: Secondary | ICD-10-CM

## 2017-09-29 DIAGNOSIS — E113599 Type 2 diabetes mellitus with proliferative diabetic retinopathy without macular edema, unspecified eye: Secondary | ICD-10-CM | POA: Diagnosis not present

## 2017-09-29 DIAGNOSIS — Z794 Long term (current) use of insulin: Secondary | ICD-10-CM

## 2017-09-29 DIAGNOSIS — Z23 Encounter for immunization: Secondary | ICD-10-CM

## 2017-09-29 DIAGNOSIS — Z79899 Other long term (current) drug therapy: Secondary | ICD-10-CM

## 2017-09-29 DIAGNOSIS — E785 Hyperlipidemia, unspecified: Secondary | ICD-10-CM

## 2017-09-29 DIAGNOSIS — K219 Gastro-esophageal reflux disease without esophagitis: Secondary | ICD-10-CM | POA: Diagnosis not present

## 2017-09-29 DIAGNOSIS — Z7982 Long term (current) use of aspirin: Secondary | ICD-10-CM | POA: Diagnosis not present

## 2017-09-29 DIAGNOSIS — I10 Essential (primary) hypertension: Secondary | ICD-10-CM

## 2017-09-29 LAB — GLUCOSE, CAPILLARY: Glucose-Capillary: 139 mg/dL — ABNORMAL HIGH (ref 70–99)

## 2017-09-29 MED ORDER — AMLODIPINE BESYLATE 10 MG PO TABS: 10.0000 mg | ORAL_TABLET | Freq: Every day | ORAL | 2 refills | Status: DC

## 2017-09-29 NOTE — Patient Instructions (Signed)
For your blood pressure we will increase amlodipine to 10mg  daily Continue metoprolol 100mg  daily and lisinopril 40mg  daily

## 2017-09-29 NOTE — Progress Notes (Signed)
   CC: Hypertension  HPI:  Ms.Quanisha Judie Petit Mahurin is a 67 y.o. with a PMH of T2DM with peripheral neuropathy, HTN, HLD, GERD presenting to clinic for f/uHTN and DM.  HTN: Patient states compliance with amlodipine 5mg  daily, lisinopril 40mg  daily and metoprolol 100mg  daily. She reports high blood pressure readings at therapy and when she goes to the pharmacy (usually 160-180). She denies headaches, vision or hearing changes, chest pain, shortness of breath.   T2DM: Patient states she had to stop victoza due to stomach upset of about 2wks which resolved after stopping the medicines. She continues to take lantus 42 units daily, novolog 12 units BID, and metformin 1000mg  daily (does not tolerate higher dose).   Please see problem based Assessment and Plan for status of patients chronic conditions.  Past Medical History:  Diagnosis Date  . Coronary atherosclerosis 04/13/2008   Cath in 2001 (for DOE) showed small vessel coronary disease; Myoview 2010 probable soft tissue attenuation but overall normal perfusion and normal LV function. An echocardiogram 2004 revealed normal LV function, mild LAE and mild MR. Myoview 2018 low risk.  Marland Kitchen GERD (gastroesophageal reflux disease)   . History of cataract surgery 03/01/2015  . Hyperlipidemia 04/13/2008   Declines statins - myalgias  . Hypertension   . Nail avulsion of toe 11/26/2015   right great toe - nontraumatic  . Parkinson's disease (HCC)    questionable; patient endorses diagnosis; no symptoms  . Type 2 diabetes mellitus with proliferative retinopathy without macular edema (HCC) 04/13/2008   [facility]: Severe proliferative retinopathy with clinically significant macular edema, stable vitreous hemorrhage of left eye, and stable age-related nuclear cataract of both eyes.  08/09/14: [facility]: Bilateral rapidly progressive diabetic retinopathy with no macular edema     Review of Systems:   Per HPI  Physical  Exam:  Vitals:   09/29/17 1514  BP: (!) 146/64  Pulse: 74  Temp: 98.4 F (36.9 C)  TempSrc: Oral  SpO2: 97%  Weight: 199 lb 3.2 oz (90.4 kg)  Height: 5\' 1"  (1.549 m)   GENERAL- alert, co-operative, appears as stated age, not in any distress.Marland Kitchen CARDIAC- RRR, no murmurs, rubs or gallops. RESP- Moving equal volumes of air, and clear to auscultation bilaterally, no wheezes or crackles. ABDOMEN- Soft, nontender, bowel sounds present. NEURO- CN 2-12 grossly intact. EXTREMITIES- pulse 2+ PT, symmetric, no pedal edema. PSYCH- Normal mood and affect, appropriate thought content and speech.  Assessment & Plan:   See Encounters Tab for problem based charting.   Patient discussed with Dr. Cleda Daub   Nyra Market, MD Internal Medicine PGY-3

## 2017-09-29 NOTE — Progress Notes (Signed)
Documentation for Freestyle Libre Pro Continuous glucose monitoring Freestyle Libre Pro CGM sensor placed and started on Destiny Russell who was identified by name and date of birth.  Patient was educated about wearing sensor, keeping food, activity and medication log and when to call office.She was educated about how to care for the sensor and not to have an MRI, CT or Diathermy while wearing the sensor and what she can learn from the Continuous glucose monitoring. She verbalized understanding. Sh also noted that her blood sugar drops after increased activity. Follow up was arranged with the patient for 1 week to see a doctor and diabetes educator.  Lot #:[id number] Serial #:[id number] Expiration Date:04/05/2018 Destiny Russell, RD 09/29/2017 4:56 PM.

## 2017-10-01 ENCOUNTER — Encounter: Payer: Self-pay | Admitting: Internal Medicine

## 2017-10-01 NOTE — Assessment & Plan Note (Signed)
Patient states she had to stop victoza due to stomach upset of about 2wks which resolved after stopping the medicines. She continues to take lantus 42 units daily, novolog 12 units BID, and metformin 1000mg  daily (does not tolerate higher dose).   Plan: --place CGM today; she will f/u in 1 and 2wks for download and adjustment of regimen --continue lantus 42 units daily --continue novolog 12 units BID --continue metformin 1000mg  daily --does not tolerate GLP 1 agonists and had intolerable increase in yeast infections with SGLT2I so these will be avoided in the future

## 2017-10-01 NOTE — Assessment & Plan Note (Signed)
BP still uncontrolled after addition of amlodipine; she is asymptomatic.  Plan: --increase amlodipine to 10mg  daily --continue lisinopril 40mg  daily --continue metoprolol 100mg  daily --f/u in 1-2 wks (being seen for CGM as well); consider either addition of HCTZ with f/u Bmet; if uncontrolled with addition of thiazide then will start workup for 2ndary hypertension.

## 2017-10-04 NOTE — Progress Notes (Signed)
Internal Medicine Clinic Attending  Case discussed with Dr. Svalina  at the time of the visit.  We reviewed the resident's history and exam and pertinent patient test results.  I agree with the assessment, diagnosis, and plan of care documented in the resident's note.  

## 2017-10-05 ENCOUNTER — Ambulatory Visit
Payer: Medicare Other | Attending: Student in an Organized Health Care Education/Training Program | Admitting: Physical Therapy

## 2017-10-05 ENCOUNTER — Encounter: Payer: Self-pay | Admitting: Physical Therapy

## 2017-10-05 DIAGNOSIS — M5442 Lumbago with sciatica, left side: Secondary | ICD-10-CM | POA: Diagnosis not present

## 2017-10-05 DIAGNOSIS — R296 Repeated falls: Secondary | ICD-10-CM | POA: Insufficient documentation

## 2017-10-05 DIAGNOSIS — R262 Difficulty in walking, not elsewhere classified: Secondary | ICD-10-CM | POA: Diagnosis not present

## 2017-10-05 DIAGNOSIS — G8929 Other chronic pain: Secondary | ICD-10-CM | POA: Insufficient documentation

## 2017-10-05 DIAGNOSIS — R2689 Other abnormalities of gait and mobility: Secondary | ICD-10-CM | POA: Diagnosis not present

## 2017-10-05 DIAGNOSIS — M25511 Pain in right shoulder: Secondary | ICD-10-CM | POA: Insufficient documentation

## 2017-10-05 DIAGNOSIS — R2681 Unsteadiness on feet: Secondary | ICD-10-CM | POA: Insufficient documentation

## 2017-10-06 NOTE — Therapy (Signed)
[facility] [address] [place], [place], [address] Phone: [phone]   Fax:  [phone]  Physical Therapy Treatment/ Progress   Patient Details  Name: Destiny Russell MRN: [id number] Date of Birth: [birth year] Referring Provider (PT): Dr Nyra Market    Encounter Date: 10/05/2017  PT End of Session - 10/05/17 1422    Visit Number  10    Number of Visits  18    Date for PT Re-Evaluation  11/02/17    Authorization Type  medicare kx modifier at 15     PT Start Time  1415    PT Stop Time  1456    PT Time Calculation (min)  41 min    Activity Tolerance  Patient tolerated treatment well    Behavior During Therapy  [facility] for tasks assessed/performed       Past Medical History:  Diagnosis Date  . Coronary atherosclerosis 04/13/2008   Cath in 2001 (for DOE) showed small vessel coronary disease; Myoview 2010 probable soft tissue attenuation but overall normal perfusion and normal LV function. An echocardiogram 2004 revealed normal LV function, mild LAE and mild MR. Myoview 2018 low risk.  Marland Kitchen GERD (gastroesophageal reflux disease)   . History of cataract surgery 03/01/2015  . Hyperlipidemia 04/13/2008   Declines statins - myalgias  . Hypertension   . Nail avulsion of toe 11/26/2015   right great toe - nontraumatic  . Parkinson's disease (HCC)    questionable; patient endorses diagnosis; no symptoms  . Type 2 diabetes mellitus with proliferative retinopathy without macular edema (HCC) 04/13/2008   [facility]: Severe proliferative retinopathy with clinically significant macular edema, stable vitreous hemorrhage of left eye, and stable age-related nuclear cataract of both eyes.  08/09/14: [facility]: Bilateral rapidly progressive diabetic retinopathy with no macular edema     Past Surgical History:  Procedure Laterality Date  . CARDIAC CATHETERIZATION    . cataract    . CESAREAN SECTION       There were no vitals filed for this visit.  Subjective Assessment - 10/05/17 1419    Subjective  Patient reports her back is mostly hurting when she is sweeping and doing yard activity. her shoulder is feeling better but she is having some pain when she reaches back.     Limitations  Standing;Walking    How long can you sit comfortably?  depends but has to shift often     How long can you stand comfortably?  >5 min     How long can you walk comfortably?  limited community distances with a cane     Diagnostic tests  nothing recent in the shoulder and hip     Patient Stated Goals  to have less pain     Currently in Pain?  Yes    Pain Score  3     Pain Location  Shoulder    Pain Orientation  Right    Pain Descriptors / Indicators  Aching    Pain Type  Chronic pain    Pain Onset  More than a month ago    Pain Frequency  Constant    Aggravating Factors   reaching back, right     Pain Relieving Factors  rest     Effect of Pain on Daily Activities  difficulty perfroming ADL's     Multiple Pain Sites  No  [facility] Adult PT Treatment/Exercise - 10/06/17 0001      Lumbar Exercises: Stretches   Lower Trunk Rotation Limitations  x10       Lumbar Exercises: Supine   Clam Limitations  2x10 yellow     Bent Knee Raise Limitations  2x10     Other Supine Lumbar Exercises  ball squeeze 2x10              PT Education - 10/06/17 1310    Education Details  reviewed exercises; reviewed POC going forward     Person(s) Educated  Patient    Methods  Explanation;Demonstration;Verbal cues;Tactile cues    Comprehension  Verbalized understanding;Returned demonstration;Verbal cues required;Tactile cues required;Need further instruction       PT Short Term Goals - 09/22/17 0803      PT SHORT TERM GOAL #1   Title  Patient will increase passive right shoulder flexion to 115 degrees     Time  4    Period  Weeks    Status  Achieved      PT SHORT TERM GOAL  #2   Title  Patient will increase lumbar flexion by 20 degrees without pain or loss of balance     Time  4    Period  Weeks    Status  Achieved      PT SHORT TERM GOAL #3   Title  Patient will increase passive shoulder ER to 40 degrees     Time  4    Period  Weeks    Status  Achieved        PT Long Term Goals - 09/16/17 1510      PT LONG TERM GOAL #1   Title  Patient will increse bilateral lower extremity strength to 4+/5 in order to improve functional mobility     Baseline  beginning LE strengthening , not tested    Time  8    Period  Weeks    Status  On-going      PT LONG TERM GOAL #2   Title  Patient will stand for 10 minutes without increased pain in order to perfrom her ADL's     Baseline  standing time limited.  Time not estimated.    Time  8    Period  Weeks    Status  On-going      PT LONG TERM GOAL #3   Title  Patient will demonstrate a 55% limitation     Time  8    Period  Weeks    Status  Unable to assess            Plan - 10/06/17 1312    Clinical Impression Statement  Patient demonstreated improved bilateral LE and shoulder strangth. She also hs improved motion in both although she is still likled in active flexion and internal rotation. Her back pain has improved but she still has pain when she perfroms functional tasks. She has been working on her balance exercises at home.     Clinical Presentation  Evolving    Clinical Decision Making  Moderate    Rehab Potential  Good    PT Frequency  2x / week    PT Duration  8 weeks    PT Treatment/Interventions  ADLs/Self Care Home Management;Cryotherapy;Retail banker;Therapeutic activities;Therapeutic exercise;Patient/family education;Neuromuscular re-education;Manual techniques;Taping;Passive range of motion    PT Next Visit Plan  core, shoulder strenghtening and stretching, modalites and MT PRN.  continue LE strength/  consider hamstring stretch/  gastroc stretch.     Consulted and Agree with Plan of Care  Patient       Patient will benefit from skilled therapeutic intervention in order to improve the following deficits and impairments:  Pain, Postural dysfunction, Increased muscle spasms, Decreased activity tolerance, Decreased endurance, Decreased range of motion, Decreased strength, Impaired UE functional use  Visit Diagnosis: Chronic right shoulder pain  Chronic left-sided low back pain with left-sided sciatica  Difficulty in walking, not elsewhere classified  Repeated falls     Problem List Patient Active Problem List   Diagnosis Date Noted  . Left-sided back pain 08/05/2017  . Chronic right shoulder pain 08/05/2017  . Current moderate episode of major depressive disorder without prior episode (HCC) 08/05/2017  . Chest pain on exertion 07/22/2016  . Dyspnea on exertion 06/04/2016  . Diabetic peripheral neuropathy (HCC) 06/04/2016  . Dizziness 05/07/2016  . GERD (gastroesophageal reflux disease) 10/28/2013  . Healthcare maintenance 09/07/2012  . Hyperlipidemia 04/13/2008  . Essential hypertension 04/13/2008  . Coronary atherosclerosis 04/13/2008  . Type 2 diabetes mellitus with proliferative retinopathy without macular edema (HCC) 04/13/2008    Dessie Coma 10/06/2017, 1:22 PM  [facility] [address] [place], [place], [address] Phone: [phone]   Fax:  [phone]  Name: Destiny Russell MRN: [id number] Date of Birth: 04-Apr-1950

## 2017-10-06 NOTE — Addendum Note (Signed)
Addended by: Dessie Coma on: 10/06/2017 01:31 PM   Modules accepted: Orders

## 2017-10-07 ENCOUNTER — Encounter: Payer: Self-pay | Admitting: Internal Medicine

## 2017-10-07 ENCOUNTER — Ambulatory Visit (INDEPENDENT_AMBULATORY_CARE_PROVIDER_SITE_OTHER): Payer: Medicare Other | Admitting: Internal Medicine

## 2017-10-07 ENCOUNTER — Other Ambulatory Visit: Payer: Self-pay | Admitting: Internal Medicine

## 2017-10-07 ENCOUNTER — Ambulatory Visit: Payer: Medicare Other | Admitting: Physical Therapy

## 2017-10-07 ENCOUNTER — Other Ambulatory Visit: Payer: Self-pay

## 2017-10-07 ENCOUNTER — Encounter: Payer: Self-pay | Admitting: Physical Therapy

## 2017-10-07 ENCOUNTER — Ambulatory Visit (INDEPENDENT_AMBULATORY_CARE_PROVIDER_SITE_OTHER): Payer: Medicare Other | Admitting: Dietician

## 2017-10-07 VITALS — BP 141/68 | HR 73 | Temp 97.8°F | Ht 62.5 in | Wt 200.7 lb

## 2017-10-07 DIAGNOSIS — Z794 Long term (current) use of insulin: Secondary | ICD-10-CM | POA: Diagnosis not present

## 2017-10-07 DIAGNOSIS — R2689 Other abnormalities of gait and mobility: Secondary | ICD-10-CM | POA: Diagnosis not present

## 2017-10-07 DIAGNOSIS — F321 Major depressive disorder, single episode, moderate: Secondary | ICD-10-CM

## 2017-10-07 DIAGNOSIS — R262 Difficulty in walking, not elsewhere classified: Secondary | ICD-10-CM | POA: Diagnosis not present

## 2017-10-07 DIAGNOSIS — M5442 Lumbago with sciatica, left side: Secondary | ICD-10-CM | POA: Diagnosis not present

## 2017-10-07 DIAGNOSIS — Z6836 Body mass index (BMI) 36.0-36.9, adult: Secondary | ICD-10-CM

## 2017-10-07 DIAGNOSIS — Z713 Dietary counseling and surveillance: Secondary | ICD-10-CM

## 2017-10-07 DIAGNOSIS — Z7982 Long term (current) use of aspirin: Secondary | ICD-10-CM | POA: Diagnosis not present

## 2017-10-07 DIAGNOSIS — E119 Type 2 diabetes mellitus without complications: Secondary | ICD-10-CM

## 2017-10-07 DIAGNOSIS — G8929 Other chronic pain: Secondary | ICD-10-CM

## 2017-10-07 DIAGNOSIS — E113599 Type 2 diabetes mellitus with proliferative diabetic retinopathy without macular edema, unspecified eye: Secondary | ICD-10-CM

## 2017-10-07 DIAGNOSIS — I10 Essential (primary) hypertension: Secondary | ICD-10-CM

## 2017-10-07 DIAGNOSIS — M25511 Pain in right shoulder: Secondary | ICD-10-CM | POA: Diagnosis not present

## 2017-10-07 DIAGNOSIS — R296 Repeated falls: Secondary | ICD-10-CM | POA: Diagnosis not present

## 2017-10-07 DIAGNOSIS — Z79899 Other long term (current) drug therapy: Secondary | ICD-10-CM | POA: Diagnosis not present

## 2017-10-07 DIAGNOSIS — E785 Hyperlipidemia, unspecified: Secondary | ICD-10-CM

## 2017-10-07 DIAGNOSIS — F17220 Nicotine dependence, chewing tobacco, uncomplicated: Secondary | ICD-10-CM | POA: Diagnosis not present

## 2017-10-07 DIAGNOSIS — R2681 Unsteadiness on feet: Secondary | ICD-10-CM

## 2017-10-07 NOTE — Progress Notes (Deleted)
   CC: Diabetes follow up  HPI:  [name] is a 67 y.o. female with past medical history outlined below here for diabetes follow up. For the details of today's visit, please refer to the assessment and plan.  Past Medical History:  Diagnosis Date  . Coronary atherosclerosis 04/13/2008   Cath in 2001 (for DOE) showed small vessel coronary disease; Myoview 2010 probable soft tissue attenuation but overall normal perfusion and normal LV function. An echocardiogram 2004 revealed normal LV function, mild LAE and mild MR. Myoview 2018 low risk.  Marland Kitchen GERD (gastroesophageal reflux disease)   . History of cataract surgery 03/01/2015  . Hyperlipidemia 04/13/2008   Declines statins - myalgias  . Hypertension   . Nail avulsion of toe 11/26/2015   right great toe - nontraumatic  . Parkinson's disease (HCC)    questionable; patient endorses diagnosis; no symptoms  . Type 2 diabetes mellitus with proliferative retinopathy without macular edema (HCC) 04/13/2008   [facility]: Severe proliferative retinopathy with clinically significant macular edema, stable vitreous hemorrhage of left eye, and stable age-related nuclear cataract of both eyes.  08/09/14: [facility]: Bilateral rapidly progressive diabetic retinopathy with no macular edema     ROS  Physical Exam:  There were no vitals filed for this visit.  Constitutional: NAD, appears comfortable HEENT: Atraumatic, normocephalic. PERRL, anicteric sclera. Moist mucous membranes.  Neck: Supple, trachea midline. No lymphadenopathy.  Cardiovascular: RRR, no murmurs, rubs, or gallops.  Pulmonary/Chest: CTAB, no wheezes, rales, or rhonchi. No chest wall abnormalities.  Abdominal: Soft, non tender, non distended. +BS.  GU: ***  Extremities: Warm and well perfused. Distal pulses intact. No edema.  Neurological: A&Ox3, CN II - XII grossly intact.  Skin: No rashes or erythema  Psychiatric: Normal mood and  affect  Assessment & Plan:   See Encounters Tab for problem based charting.  Patient discussed with Dr. Heide Spark

## 2017-10-07 NOTE — Patient Instructions (Signed)
Thank you for wearing the Continuous glucose monitoring Sensor and Good job keeping records! Very detailed and thorough!  Try to eat about the same amount of carbohydrate for each meal so the amount of mealtime insulin can cover/match it.    Please try to eat about 3 carb choices (45 grams)  at each meal and 1 choice (15 grams)  for snacks   Examples of 3 carb choices (45 grams):  2 starch + 1 fruit         1 starch + 1 milk + 1 fruit       2 starch + 1 milk        3 starches    Food groups that you need to count carbohydrats in are: 1- Starch and grains- rice, cereal, bread, pasta 2- Starchy vegetables like potatoes and corn 3- Fruit and fruit juices 4- Milk/dairy, ice cream, yogurt, pudding 5- Sweets like jelly, syrup, candy  Veggies, healthy fats and protein/meats do not count- add them as needed.   Homework- PLease try to circle all the foods you have eaten that contain high carbohydrate on your food records.   Please make an appointment for 1 week.   [name] [phone]

## 2017-10-07 NOTE — Progress Notes (Signed)
  Medical Nutrition Therapy:  Appt start time: 0855 end time:  0945. Visit # 1  Assessment:  Primary concerns today: glycemic control.  Destiny Russell is here with her son. She has had diabetes education in the past and tries to eat balanced meals the best she can. She thinks she needs insulin at her evening meal and prefers insulin to other diabetes medicines. Her weight is stable.  She learned about carbs in the past but has forgotten some sources of carbs, does not read labels and does not measure her foods. She is insulin stacking uninentionally between 1-6 Pm due to meal insulin doses being in excess of her carb intake and then her blood sugar rises sharply in the evening as she does not currently take dinner prandial coverage. It  falls ~ 100 mg/dl overnight due to fasting and her basal insulin dose.  Preferred Learning Style: no preference indicated  Learning Readiness: Contemplating vs Ready  ANTHROPOMETRICS: Estimated body mass index is 36.12 kg/m as calculated from the following:   Height as of an earlier encounter on 10/07/17: 5' 2.5" (1.588 m).   Weight as of an earlier encounter on 10/07/17: 200 lb 11.2 oz (91 kg).  WEIGHT HISTORY:  Wt Readings from Last 5 Encounters:  10/07/17 200 lb 11.2 oz (91 kg)  09/29/17 199 lb 3.2 oz (90.4 kg)  08/04/17 200 lb 9.6 oz (91 kg)  03/24/17 201 lb (91.2 kg)  01/20/17 197 lb 12.8 oz (89.7 kg)   SLEEP:need to assess at future visit MEDICATIONS: feels best when on insulin basal and bolus BLOOD SUGAR: CGM download showed a significant drop in her blood sugar between 2-6 Pm ( she says when she is more active and also has taken breakfast and lunch insulin just a few hours apart.  DIETARY INTAKE: Usual eating pattern includes 3 meals and 1-2 snacks per day, Constipation: no, Dining Out (times/week): seldom  Progress Towards Goal(s):  In progress.   Nutritional Diagnosis:  [place]-2.4 Predicted food-medication interaction As related to need to match prandial  insulin dose to carb intake.  As evidenced by her eleated A1C and highly variable blood sugars on CGM report. .    Intervention:  Nutrition education about insulin action and carb counting. Also reviewed CGM download with she and her son.  Coordination of care: Recommend aligning her bask and bolus insulin doses as best as possible. I think she'll need a lower basal insulin dose to increase her prandial sufficiently.   Teaching Method Utilized: Visual, Auditory, Hands on Handouts given during visit include: carb counting and meal planning for diabetes , avs Barriers to learning/adherence to lifestyle change: competing values Demonstrated degree of understanding via:  Teach Back   Monitoring/Evaluation:  Dietary intake, exercise, meter and CGM, and body weight in 1 week(s). Cecil Cranker Latorsha Curling [name] Harvest Stanco, RD 10/08/2017 5:11 PM.

## 2017-10-07 NOTE — Patient Instructions (Addendum)
Destiny Russell,  It was a pleasure taking care of you. For you Diabetes, please take 6 units of Novolog three times daily before meals. We will follow-up in a week to see how your blood sugars do with this adjustment.   Continue taking your blood pressure medications. Please bring your home blood pressure cuff in next visit so we can compare it to ours. Also bring a log of your blood pressures so we can evaluate how they are doing with the medication adjustments.   Please return in 7 days with [name] & [name] for Download #2.  Feel free to call in the mean time with any questions or concerns.

## 2017-10-07 NOTE — Assessment & Plan Note (Addendum)
Returned for 1 week CGM follow-up. Overall trend shows lower blood glucose levels mid-day with consistently elevated levels at night. Current regimen Novolog 12 BID with breakfast and lunch and 42 units Lantus qhs.   Plan - change Novolog to 6 units TID wc to get better control in the evening and avoid lows after lunch - follow-up in 1 week for second download and further adjustment - counseled on nutrition and carb counting

## 2017-10-07 NOTE — Therapy (Signed)
[facility] [address] [place], [place], [address] Phone: [phone]   Fax:  [phone]  Physical Therapy Treatment  Patient Details  Name: Destiny Russell MRN: [id number] Date of Birth: 1950-10-18 Referring Provider (PT): Dr Nyra Market    Encounter Date: 10/07/2017  PT End of Session - 10/07/17 1740    Visit Number  11    Number of Visits  18    Date for PT Re-Evaluation  11/02/17    Authorization Type  medicare kx modifier at 15     PT Start Time  1330    PT Stop Time  1413    PT Time Calculation (min)  43 min    Equipment Utilized During Treatment  Gait belt   during standing balance exercises   Activity Tolerance  Patient tolerated treatment well    Behavior During Therapy  WFL for tasks assessed/performed       Past Medical History:  Diagnosis Date  . Coronary atherosclerosis 04/13/2008   Cath in 2001 (for DOE) showed small vessel coronary disease; Myoview 2010 probable soft tissue attenuation but overall normal perfusion and normal LV function. An echocardiogram 2004 revealed normal LV function, mild LAE and mild MR. Myoview 2018 low risk.  Marland Kitchen GERD (gastroesophageal reflux disease)   . History of cataract surgery 03/01/2015  . Hyperlipidemia 04/13/2008   Declines statins - myalgias  . Hypertension   . Nail avulsion of toe 11/26/2015   right great toe - nontraumatic  . Parkinson's disease (HCC)    questionable; patient endorses diagnosis; no symptoms  . Type 2 diabetes mellitus with proliferative retinopathy without macular edema (HCC) 04/13/2008   [facility]: Severe proliferative retinopathy with clinically significant macular edema, stable vitreous hemorrhage of left eye, and stable age-related nuclear cataract of both eyes.  08/09/14: [facility]: Bilateral rapidly progressive diabetic retinopathy with no macular edema     Past Surgical History:  Procedure Laterality  Date  . CARDIAC CATHETERIZATION    . cataract    . CESAREAN SECTION      There were no vitals filed for this visit.  Subjective Assessment - 10/07/17 1329    Subjective  Patient reported soreness in shoulder day after therapy, but none in the low back. Pt stated she has done a lot of walking throughout the morning and she was relieved that it did not cause back pain.     Patient is accompained by:  Family member    Limitations  Standing;Walking    How long can you sit comfortably?  depends but has to shift often     How long can you stand comfortably?  >5 min     How long can you walk comfortably?  limited community distances with a cane     Diagnostic tests  nothing recent in the shoulder and hip     Patient Stated Goals  to have less pain     Currently in Pain?  Yes    Pain Score  0-No pain    Aggravating Factors   reaching back with the right shoulder     Pain Relieving Factors  rest     Effect of Pain on Daily Activities  difficulty performing ADL's    Pain Score  0    Pain Radiating Towards  can radiate down her arm    Pain Onset  More than a month ago    Pain Frequency  Intermittent  Aggravating Factors   sweeping    Pain Relieving Factors  difficulty performing ADL's                       [facility] Adult PT Treatment/Exercise - 10/07/17 0001      High Level Balance   High Level Balance Comments  narrow base of support 1x30 EC;       Lumbar Exercises: Stretches   Lower Trunk Rotation Limitations  x20    Other Lumbar Stretch Exercise  knee to chest both legs 2x20sec; piriformis stretch 2x20sec      Lumbar Exercises: Aerobic   Nustep  L 4 UE/LE  5 minutes      Lumbar Exercises: Supine   Clam Limitations  2x10 red    Other Supine Lumbar Exercises  Ball squeeze 2x10      Shoulder Exercises: Standing   External Rotation  Right;20 reps    Theraband Level (Shoulder External Rotation)  Level 1 (Yellow)    Internal Rotation  Right;20 reps    Extension   Strengthening;Both;20 reps    Theraband Level (Shoulder Extension)  Level 1 (Yellow)    Row  Strengthening;Both;10 reps    Theraband Level (Shoulder Row)  Level 1 (Yellow)      Manual Therapy   Manual Therapy  Joint mobilization    Manual therapy comments  PROM into all planes of the shoulder     Joint Mobilization  Grade I and II posterior, anterior, inferior glides to improve flexion, IR and ER             PT Education - 10/07/17 1739    Education Details  Reviewed HEP with yellow theraband in doorway for strengthening exercises    Person(s) Educated  Patient    Methods  Explanation;Demonstration;Tactile cues;Verbal cues    Comprehension  Verbalized understanding;Returned demonstration;Verbal cues required;Tactile cues required;Need further instruction       PT Short Term Goals - 09/22/17 0803      PT SHORT TERM GOAL #1   Title  Patient will increase passive right shoulder flexion to 115 degrees     Time  4    Period  Weeks    Status  Achieved      PT SHORT TERM GOAL #2   Title  Patient will increase lumbar flexion by 20 degrees without pain or loss of balance     Time  4    Period  Weeks    Status  Achieved      PT SHORT TERM GOAL #3   Title  Patient will increase passive shoulder ER to 40 degrees     Time  4    Period  Weeks    Status  Achieved        PT Long Term Goals - 09/16/17 1510      PT LONG TERM GOAL #1   Title  Patient will increse bilateral lower extremity strength to 4+/5 in order to improve functional mobility     Baseline  beginning LE strengthening , not tested    Time  8    Period  Weeks    Status  On-going      PT LONG TERM GOAL #2   Title  Patient will stand for 10 minutes without increased pain in order to perfrom her ADL's     Baseline  standing time limited.  Time not estimated.    Time  8    Period  Weeks  Status  On-going      PT LONG TERM GOAL #3   Title  Patient will demonstrate a 55% limitation     Time  8    Period   Weeks    Status  Unable to assess            Plan - 10/07/17 1741    Clinical Impression Statement  Per visual inspection pt with increased shoulder flexion and ER PROM following GHJ mobilizations and gentle stretching. Pt performing low back exercises with less cueing and is tolerating increased resistance well just reports muscle fatigue in quads and gluts afterwards. Pt demonstrating less trunk sway and less reliance on Bil UE support with EC in tandem stance, but requires support ofstudent therapist intermittently with left foot forward to prevent LOB posteriorly.     History and Personal Factors relevant to plan of care:  DM 2 with periphral neuropahty; arthritis in multiple joints    Clinical Presentation  Evolving    Clinical Presentation due to:  increasing pain in her back and shoulders, frequent falls    Clinical Decision Making  Moderate    Rehab Potential  Good    PT Frequency  2x / week    PT Duration  8 weeks    PT Treatment/Interventions  ADLs/Self Care Home Management;Cryotherapy;Retail banker;Therapeutic activities;Therapeutic exercise;Patient/family education;Neuromuscular re-education;Manual techniques;Taping;Passive range of motion    PT Next Visit Plan  core, shoulder strenghtening and stretching, modalites and MT PRN.  progress balance activities- air ex pad, pertibations for reactive balance; 4 way hip; marching/ toe taps    Consulted and Agree with Plan of Care  Patient       Patient will benefit from skilled therapeutic intervention in order to improve the following deficits and impairments:  Pain, Postural dysfunction, Increased muscle spasms, Decreased activity tolerance, Decreased endurance, Decreased range of motion, Decreased strength, Impaired UE functional use  Visit Diagnosis: Chronic right shoulder pain  Chronic left-sided low back pain with left-sided sciatica  Difficulty in walking, not elsewhere  classified  Repeated falls  Other abnormalities of gait and mobility  Unsteadiness on feet     Problem List Patient Active Problem List   Diagnosis Date Noted  . Left-sided back pain 08/05/2017  . Chronic right shoulder pain 08/05/2017  . Current moderate episode of major depressive disorder without prior episode (HCC) 08/05/2017  . Chest pain on exertion 07/22/2016  . Dyspnea on exertion 06/04/2016  . Diabetic peripheral neuropathy (HCC) 06/04/2016  . Dizziness 05/07/2016  . GERD (gastroesophageal reflux disease) 10/28/2013  . Healthcare maintenance 09/07/2012  . Hyperlipidemia 04/13/2008  . Essential hypertension 04/13/2008  . Coronary atherosclerosis 04/13/2008  . Type 2 diabetes mellitus with proliferative retinopathy without macular edema (HCC) 04/13/2008    Dessie Coma  PT DPT  10/07/2017, 9:12 PM  [facility] [address] [place], [place], [address] Phone: [phone]   Fax:  [phone]  Name: Destiny Russell MRN: [id number] Date of Birth: 06-06-1950

## 2017-10-07 NOTE — Progress Notes (Signed)
   CC: CGM, HTN follow-up   HPI:  Destiny Russell is a 67 y.o. woman with PMH of T2Dm, HTN, HLD presenting to [facility] for follow-up HTN and DM.   For her HTN, patient's Amlodipine 5 mg was increased to 10 mg last visit (9/25). She reports compliance with blood pressure medications and has tolerated increased dose. Notes her blood pressure readings at home have improved from 190s/90 to 130s/70 the last couple of days. Denies headaches, vision changes, chest pain or shortness of breath.   T2DM: Patient had CGM placed at last visit. Patient's glucose trend consistently elevated at night with occasional lows mid-day. Patient is symptomatic with hypoglycemic events; endorses diaphoresis and weakness. She met with Butch Penny today for further nutritional education and insulin adjustments.   Past Medical History:  Diagnosis Date  . Coronary atherosclerosis 04/13/2008   Cath in 2001 (for DOE) showed small vessel coronary disease; Myoview 2010 probable soft tissue attenuation but overall normal perfusion and normal LV function. An echocardiogram 2004 revealed normal LV function, mild LAE and mild MR. Myoview 2018 low risk.  Marland Kitchen GERD (gastroesophageal reflux disease)   . History of cataract surgery 03/01/2015  . Hyperlipidemia 04/13/2008   Declines statins - myalgias  . Hypertension   . Nail avulsion of toe 11/26/2015   right great toe - nontraumatic  . Parkinson's disease ([place])    questionable; patient endorses diagnosis; no symptoms  . Type 2 diabetes mellitus with proliferative retinopathy without macular edema ([place]) 04/13/2008   [facility]: Severe proliferative retinopathy with clinically significant macular edema, stable vitreous hemorrhage of left eye, and stable age-related nuclear cataract of both eyes.  08/09/14: [facility]: Bilateral rapidly progressive diabetic retinopathy with no macular edema    Review of Systems:  General: no fevers, chills,  headaches Cardio: no chest pain, palpitations  GI: no n/v/d  Physical Exam:   Vitals:   10/07/17 0939  BP: (!) 150/66  Pulse: 73  Temp: 97.8 F (36.6 C)  TempSrc: Oral  SpO2: 98%  Weight: 200 lb 11.2 oz (91 kg)  Height: 5' 2.5" (1.588 m)   General: alert, well-nourished, appears stated age CV: RRR; no murmurs, rubs or gallops Pulm: normal respiratory effort; lungs CTA bilaterally  Psych: appropriate mood and affect Ext: no lower extremity edema   Assessment & Plan:   See Encounters Tab for problem based charting.  Patient seen with Dr. Dareen Piano

## 2017-10-07 NOTE — Assessment & Plan Note (Signed)
PHQ-9 12 today. No SI/HI. Patient states her symptoms have significantly improved with starting Duloxetine 30 mg 8/1 and feels most of her symptoms are related to pain. Since this has improved, she is able to do more during the day and feels more life herself. Will continue to monitor depression screening and increase Duloxetine if patient's symptoms worsen.

## 2017-10-07 NOTE — Assessment & Plan Note (Signed)
Patient's blood pressure improved today since increasing Amlodipine to 10 mg. Will have her bring in home blood pressure cuff to compare to one in the office, as well as bring her blood pressure log. Continue on current regimen of Amlodipine 10 mg, Metoprolol 100 mg, Lisinopril 40 mg.  Follow-up in 1 week when she returns for CGM download.

## 2017-10-08 NOTE — Progress Notes (Signed)
Internal Medicine Clinic Attending  I saw and evaluated the patient.  I personally confirmed the key portions of the history and exam documented by Dr. Bloomfield and I reviewed pertinent patient test results.  The assessment, diagnosis, and plan were formulated together and I agree with the documentation in the resident's note.  

## 2017-10-12 ENCOUNTER — Encounter: Payer: Self-pay | Admitting: Physical Therapy

## 2017-10-12 ENCOUNTER — Ambulatory Visit: Payer: Medicare Other | Admitting: Physical Therapy

## 2017-10-12 DIAGNOSIS — R296 Repeated falls: Secondary | ICD-10-CM

## 2017-10-12 DIAGNOSIS — R2689 Other abnormalities of gait and mobility: Secondary | ICD-10-CM

## 2017-10-12 DIAGNOSIS — M25511 Pain in right shoulder: Principal | ICD-10-CM

## 2017-10-12 DIAGNOSIS — R262 Difficulty in walking, not elsewhere classified: Secondary | ICD-10-CM

## 2017-10-12 DIAGNOSIS — G8929 Other chronic pain: Secondary | ICD-10-CM | POA: Diagnosis not present

## 2017-10-12 DIAGNOSIS — M5442 Lumbago with sciatica, left side: Secondary | ICD-10-CM

## 2017-10-12 DIAGNOSIS — R2681 Unsteadiness on feet: Secondary | ICD-10-CM

## 2017-10-12 NOTE — Therapy (Signed)
[place], [place], [address] Phone: [phone]   Fax:  [phone]  Physical Therapy Treatment  Patient Details  Name: Destiny Russell MRN: [id number] Date of Birth: [birth year] Referring Provider (PT): Dr Alphonzo Grieve    Encounter Date: 10/12/2017  PT End of Session - 10/12/17 1755    Visit Number  12    Number of Visits  18    Date for PT Re-Evaluation  11/02/17    PT Start Time  1548    PT Stop Time  1630    PT Time Calculation (min)  42 min    Activity Tolerance  Patient tolerated treatment well    Behavior During Therapy  [facility] for tasks assessed/performed       Past Medical History:  Diagnosis Date  . Coronary atherosclerosis 04/13/2008   Cath in 2001 (for DOE) showed small vessel coronary disease; Myoview 2010 probable soft tissue attenuation but overall normal perfusion and normal LV function. An echocardiogram 2004 revealed normal LV function, mild LAE and mild MR. Myoview 2018 low risk.  Marland Kitchen GERD (gastroesophageal reflux disease)   . History of cataract surgery 03/01/2015  . Hyperlipidemia 04/13/2008   Declines statins - myalgias  . Hypertension   . Nail avulsion of toe 11/26/2015   right great toe - nontraumatic  . Parkinson's disease ([place])    questionable; patient endorses diagnosis; no symptoms  . Type 2 diabetes mellitus with proliferative retinopathy without macular edema ([place]) 04/13/2008   [facility]: Severe proliferative retinopathy with clinically significant macular edema, stable vitreous hemorrhage of left eye, and stable age-related nuclear cataract of both eyes.  08/09/14: [facility]: Bilateral rapidly progressive diabetic retinopathy with no macular edema     Past Surgical History:  Procedure Laterality Date  . CARDIAC CATHETERIZATION    . cataract    . CESAREAN SECTION      There were no vitals filed for this visit.  Subjective  Assessment - 10/12/17 1553    Subjective  PT is helping.  Doing the exercises    Currently in Pain?  Yes    Pain Score  4     Pain Location  Shoulder    Pain Orientation  Right    Pain Descriptors / Indicators  --   hurts   Pain Radiating Towards  will go into arm if she lifts something by accident.     Aggravating Factors   reaching bach and reaching into impingement  ( Abduction)    Pain Relieving Factors  change of position,  heat    Effect of Pain on Daily Activities  avoids lifting    Multiple Pain Sites  --   patient has posterior leg pain with exercises.     Pain Score  4    Pain Location  Hip    Pain Orientation  Left    Pain Descriptors / Indicators  --   hurts   Aggravating Factors   sweeping,  laying on left hip    Pain Relieving Factors  stop the activity,  sitting down,  heat                       [facility] Adult PT Treatment/Exercise - 10/12/17 0001      High Level Balance   High Level Balance Comments  static and dynamic,  compliant and non compliant , narrowed and shoulder width base od support  exercises close SBs /  CGA as needed, eyes open and closed     Lumbar Exercises: Aerobic   Nustep  L 5 UE/LE  5 minutes      Lumbar Exercises: Supine   Clam Limitations  2x10 red    Other Supine Lumbar Exercises  Ball squeeze 2x10   cues initially, monitored for correct technique     Shoulder Exercises: Standing   External Rotation  Right;10 reps    Theraband Level (Shoulder External Rotation)  Level 2 (Red)   with rolled towel   Internal Rotation  10 reps    Theraband Level (Shoulder Internal Rotation)  Level 2 (Red)   with rolled towel   Extension  Strengthening;10 reps    Theraband Level (Shoulder Extension)  Level 1 (Yellow)    Row  Strengthening    Theraband Level (Shoulder Row)  Level 2 (Red)             PT Education - 10/12/17 1754    Education Details  exrcise form    Person(s) Educated  Patient    Methods   Explanation;Demonstration;Tactile cues;Verbal cues    Comprehension  Verbalized understanding;Returned demonstration       PT Short Term Goals - 09/22/17 0803      PT SHORT TERM GOAL #1   Title  Patient will increase passive right shoulder flexion to 115 degrees     Time  4    Period  Weeks    Status  Achieved      PT SHORT TERM GOAL #2   Title  Patient will increase lumbar flexion by 20 degrees without pain or loss of balance     Time  4    Period  Weeks    Status  Achieved      PT SHORT TERM GOAL #3   Title  Patient will increase passive shoulder ER to 40 degrees     Time  4    Period  Weeks    Status  Achieved        PT Long Term Goals - 10/12/17 1751      PT LONG TERM GOAL #1   Title  Patient will increse bilateral lower extremity strength to 4+/5 in order to improve functional mobility     Time  8    Period  Weeks    Status  Unable to assess      PT LONG TERM GOAL #2   Title  Patient will stand for 10 minutes without increased pain in order to perfrom her ADL's     Baseline  Patient sometimes can stand 10 minutes no pain.  if she already has pain when she stands the pain increases    Time  8    Period  Weeks    Status  Partially Met      PT LONG TERM GOAL #3   Title  Patient will demonstrate a 55% limitation     Time  8    Period  Weeks    Status  Unable to assess            Plan - 10/12/17 1755    Clinical Impression Statement  Patient partially met LTG#2 today.  She id more functional standing at home in the kitchen.  Son notes improvements in function at home.  She has not fallen since last visit.  Balance exercises were challanging for patient requiring Close SBS to CGA for safety.      PT Next  Visit Plan  core, shoulder strenghtening and stretching, modalites and MT PRN.  progress balance activities- air ex pad, pertibations for reactive balance; 4 way hip; marching/ toe taps.  consider gait belt for balance exercises    PT Home Exercise Plan   patient has HEP from previous sessions.  no new exercises added.  She was able to progress to red band exercises for [place]    Consulted and Agree with Plan of Care  Patient;Family member/caregiver    Family Member Consulted  Son       Patient will benefit from skilled therapeutic intervention in order to improve the following deficits and impairments:     Visit Diagnosis: Chronic right shoulder pain  Chronic left-sided low back pain with left-sided sciatica  Difficulty in walking, not elsewhere classified  Repeated falls  Other abnormalities of gait and mobility  Unsteadiness on feet     Problem List Patient Active Problem List   Diagnosis Date Noted  . Left-sided back pain 08/05/2017  . Chronic right shoulder pain 08/05/2017  . Current moderate episode of major depressive disorder without prior episode ([place]) 08/05/2017  . Chest pain on exertion 07/22/2016  . Dyspnea on exertion 06/04/2016  . Diabetic peripheral neuropathy ([place]) 06/04/2016  . Dizziness 05/07/2016  . GERD (gastroesophageal reflux disease) 10/28/2013  . Healthcare maintenance 09/07/2012  . Hyperlipidemia 04/13/2008  . Essential hypertension 04/13/2008  . Coronary atherosclerosis 04/13/2008  . Type 2 diabetes mellitus with proliferative retinopathy without macular edema (HCC) 04/13/2008    HARRIS,KAREN PTA 10/12/2017, 6:01 PM  [facility] [address] [place], [place], [address] Phone: [phone]   Fax:  [phone]  Name: Destiny M Brodersen MRN: [id number] Date of Birth: 1950/04/26

## 2017-10-13 ENCOUNTER — Ambulatory Visit: Payer: Medicare Other

## 2017-10-13 ENCOUNTER — Encounter: Payer: Medicare Other | Admitting: Dietician

## 2017-10-14 ENCOUNTER — Ambulatory Visit: Payer: Medicare Other | Admitting: Physical Therapy

## 2017-10-14 ENCOUNTER — Other Ambulatory Visit: Payer: Self-pay

## 2017-10-14 ENCOUNTER — Ambulatory Visit (INDEPENDENT_AMBULATORY_CARE_PROVIDER_SITE_OTHER): Payer: Medicare Other | Admitting: Internal Medicine

## 2017-10-14 ENCOUNTER — Encounter: Payer: Self-pay | Admitting: Internal Medicine

## 2017-10-14 ENCOUNTER — Encounter: Payer: Self-pay | Admitting: Physical Therapy

## 2017-10-14 ENCOUNTER — Encounter: Payer: Self-pay | Admitting: Dietician

## 2017-10-14 ENCOUNTER — Ambulatory Visit (INDEPENDENT_AMBULATORY_CARE_PROVIDER_SITE_OTHER): Payer: Medicare Other | Admitting: Dietician

## 2017-10-14 VITALS — BP 163/93 | HR 72 | Temp 97.7°F | Ht 62.5 in | Wt 200.1 lb

## 2017-10-14 DIAGNOSIS — G8929 Other chronic pain: Secondary | ICD-10-CM | POA: Diagnosis not present

## 2017-10-14 DIAGNOSIS — M5442 Lumbago with sciatica, left side: Secondary | ICD-10-CM | POA: Diagnosis not present

## 2017-10-14 DIAGNOSIS — Z794 Long term (current) use of insulin: Secondary | ICD-10-CM

## 2017-10-14 DIAGNOSIS — R2689 Other abnormalities of gait and mobility: Secondary | ICD-10-CM

## 2017-10-14 DIAGNOSIS — R262 Difficulty in walking, not elsewhere classified: Secondary | ICD-10-CM

## 2017-10-14 DIAGNOSIS — M25511 Pain in right shoulder: Secondary | ICD-10-CM | POA: Diagnosis not present

## 2017-10-14 DIAGNOSIS — R296 Repeated falls: Secondary | ICD-10-CM

## 2017-10-14 DIAGNOSIS — I2510 Atherosclerotic heart disease of native coronary artery without angina pectoris: Secondary | ICD-10-CM | POA: Diagnosis not present

## 2017-10-14 DIAGNOSIS — E113599 Type 2 diabetes mellitus with proliferative diabetic retinopathy without macular edema, unspecified eye: Secondary | ICD-10-CM

## 2017-10-14 DIAGNOSIS — R2681 Unsteadiness on feet: Secondary | ICD-10-CM

## 2017-10-14 MED ORDER — ACCU-CHEK FASTCLIX LANCETS MISC: 1 refills | Status: AC

## 2017-10-14 MED ORDER — INSULIN LISPRO 100 UNIT/ML (KWIKPEN): 9.0000 [IU] | PEN_INJECTOR | Freq: Three times a day (TID) | SUBCUTANEOUS | 5 refills | Status: AC

## 2017-10-14 NOTE — Progress Notes (Signed)
   CC: Diabetes  HPI:[name] is a 67 y.o. female who presents for evaluation of diabetes. Please see individual problem based A/P for details.  Diabetes: Destiny Russell wore the CGM for 14 days. The average reading was 255, % time in target was 17, % time below target was 1, and % time above target was. 82. Intervention will be to increase her meal time insulin to 9U TID, she has tried multiple non insulin treatment and prefers to remain on insulin. The patient will be scheduled to see her PCP for a routine visit in ~2 months. She is requesting a CGM device. I will forward this note to [facility] to assist with this matter.   The patient is testing their blood sugar 4 times a day and they are injecting insulin 4 times a day. As a result, she would benefit from a CGM monitor.   Plan: Meal time insulin to 9U TID and Long acting to remain at 42U. She has tried multiple oral antihyperglycemics including an SGLT2i and GLP1 without success. She prefers to remain on insulin.   PHQ-9: Based on the patients    Office Visit from 10/07/2017 in [facility]  PHQ-9 Total Score  12     score we have decided to monitor and encourage lifestyle modifications including exercise. She will continue to follow with [place].  Past Medical History:  Diagnosis Date  . Coronary atherosclerosis 04/13/2008   Cath in 2001 (for DOE) showed small vessel coronary disease; Myoview 2010 probable soft tissue attenuation but overall normal perfusion and normal LV function. An echocardiogram 2004 revealed normal LV function, mild LAE and mild MR. Myoview 2018 low risk.  Marland Kitchen GERD (gastroesophageal reflux disease)   . History of cataract surgery 03/01/2015  . Hyperlipidemia 04/13/2008   Declines statins - myalgias  . Hypertension   . Nail avulsion of toe 11/26/2015   right great toe - nontraumatic  . Parkinson's disease (HCC)    questionable; patient endorses diagnosis; no symptoms  . Type 2  diabetes mellitus with proliferative retinopathy without macular edema (HCC) 04/13/2008   [facility]: Severe proliferative retinopathy with clinically significant macular edema, stable vitreous hemorrhage of left eye, and stable age-related nuclear cataract of both eyes.  08/09/14[facility]: Bilateral rapidly progressive diabetic retinopathy with no macular edema    Review of Systems:  ROS negative except as per HPI.  Physical Exam: Vitals:   10/14/17 1009  BP: (!) 152/78  Pulse: 75  Temp: 97.7 F (36.5 C)  TempSrc: Oral  SpO2: 99%  Weight: 200 lb 1.6 oz (90.8 kg)  Height: 5' 2.5" (1.588 m)   General: A/O x4, in no acute distress, afebrile, nondiaphoretic Cardio: RRR, no mrg's Pulmonary: CTA bilaterally MSK: Nontender nonedematous  Assessment & Plan:   See Encounters Tab for problem based charting.  Patient discussed with Dr. Oswaldo Done

## 2017-10-14 NOTE — Assessment & Plan Note (Signed)
  Diabetes: Destiny Russell wore the CGM for 14 days. The average reading was 255, % time in target was 17, % time below target was 1, and % time above target was. 82. Intervention will be to increase her meal time insulin to 9U TID, she has tried multiple non insulin treatment and prefers to remain on insulin. The patient will be scheduled to see her PCP for a routine visit in ~2 months. She is requesting a CGM device. I will forward this note to [facility] to assist with this matter.   The patient is testing their blood sugar 4 times a day and they are injecting insulin 4 times a day. As a result, she would benefit from a CGM monitor.   Plan: Meal time insulin to 9U TID and Long acting to remain at 42U. She has tried multiple oral antihyperglycemics including an SGLT2i and GLP1 without success. She prefers to remain on insulin.

## 2017-10-14 NOTE — Progress Notes (Addendum)
  Medical Nutrition Therapy:  Appt start time: 915 end time:  0945. Visit # 2  Assessment:  Primary concerns today: glycemic control.  Ms. Paulding is here with her son for CGM Download #2. She kept very good food and activity records. She reports less activity overall lately which she attributes to allergies and increased appetite which she attributes to a new blood pressure medicine.  Her weight is stable. Sh is taking insulin 4 times a day and testing her blood sugar 4 times a day feels she would benefit from using a CGM. She uses her glucose values to guide in in how much what and when to eat. This would probably help decrease her risk to hypoglycemia. She asks for help in getting one ordered.  ANTHROPOMETRICS: Estimated body mass index is 36.02 kg/m as calculated from the following:   Height as of an earlier encounter on 10/14/17: 5' 2.5" (1.588 m).   Weight as of this encounter: 200 lb 1.6 oz (90.8 kg).  SLEEP:need to assess at future visit MEDICATIONS: 42 units lantus at bedtime, 6 units Noovlog three times a day before meals BLOOD SUGAR: CGM download showed less of a drop in her blood sugar between 2-6 Pm, but high most other times DIETARY INTAKE: Usual eating pattern includes 3 meals and 1-2 snacks per day, Constipation: no, Dining Out (times/week): seldom  Progress Towards Goal(s):  Some progress.   Nutritional Diagnosis:  [place]-2.4 Predicted food-medication interaction As related to need to match prandial insulin dose to carb intake worsened  As evidenced by her elevated A1C and highly variable blood sugars on CGM report. .    Intervention:  Nutrition education about insulin action and carb counting. Also reviewed CGM download with she and her son.  Coordination of care: Recommend CGM order  Teaching Method Utilized: Visual, Auditory, Hands on Handouts given during visit include: avs Barriers to learning/adherence to lifestyle change: competing values Demonstrated degree of  understanding via:  Teach Back   Monitoring/Evaluation:  Dietary intake, exercise, meter and CGM, and body weight in 4 week(s). Cecil Cranker [place] [name] Gaetan Spieker, RD 10/14/2017 2:15 PM.

## 2017-10-14 NOTE — Patient Instructions (Addendum)
Dear Mrs. Dearmas,   I hope you felt like you had a productive visit today.   I will contact [employer] [employer] on your behalf to see if they can service your insurance company in providing the PERSONAL [employer]estyle Libre Continuous glucose monitor.  Please try to eat about 2-3 SERVINGS of FRUIT, STARCH (bread, rice, pasta, cereal), MILK, STARCHY VEGETABLE or sweets.   This can help to keep your blood sugar more stable.  Please call for a follow up in 3-4 weeks.   Call me anytime you have any questions or concerns about your diabetes or diabetes supplies. I plan to call you when I know more about the [facility] Continuous glucose monitor.   [name] [phone]

## 2017-10-14 NOTE — Therapy (Signed)
[place], [place], [address] Phone: [phone]   Fax:  [phone]  Physical Therapy Treatment  Patient Details  Name: Destiny Russell MRN: [id number] Date of Birth: May 31, 1950 Referring Provider (PT): Dr Alphonzo Grieve    Encounter Date: 10/14/2017  PT End of Session - 10/14/17 1749    Visit Number  13    Number of Visits  18    Date for PT Re-Evaluation  11/02/17    PT Start Time  5379    PT Stop Time  1630    PT Time Calculation (min)  45 min    Activity Tolerance  Patient tolerated treatment well    Behavior During Therapy  [facility] for tasks assessed/performed       Past Medical History:  Diagnosis Date  . Coronary atherosclerosis 04/13/2008   Cath in 2001 (for DOE) showed small vessel coronary disease; Myoview 2010 probable soft tissue attenuation but overall normal perfusion and normal LV function. An echocardiogram 2004 revealed normal LV function, mild LAE and mild MR. Myoview 2018 low risk.  Marland Kitchen GERD (gastroesophageal reflux disease)   . History of cataract surgery 03/01/2015  . Hyperlipidemia 04/13/2008   Declines statins - myalgias  . Hypertension   . Nail avulsion of toe 11/26/2015   right great toe - nontraumatic  . Parkinson's disease ([place])    questionable; patient endorses diagnosis; no symptoms  . Type 2 diabetes mellitus with proliferative retinopathy without macular edema ([place]) 04/13/2008   [facility]: Severe proliferative retinopathy with clinically significant macular edema, stable vitreous hemorrhage of left eye, and stable age-related nuclear cataract of both eyes.  08/09/14[facility]: Bilateral rapidly progressive diabetic retinopathy with no macular edema     Past Surgical History:  Procedure Laterality Date  . CARDIAC CATHETERIZATION    . cataract    . CESAREAN SECTION      There were no vitals filed for this visit.  Subjective  Assessment - 10/14/17 1552    Subjective  I cleaned my kitchen and was able to get up and down ,, clean  and was a little sore later.  in back shoulder, and legs.  It was better after sleeping then stiff first thing the it eased.        Currently in Pain?  No/denies    Pain Score  0-No pain    Pain Location  Knee    Pain Orientation  Right;Left    Multiple Pain Sites  Yes    Pain Score  5    Pain Location  Buttocks    Pain Orientation  Left;Lower    Pain Descriptors / Indicators  --   pulling   Pain Frequency  Intermittent    Aggravating Factors   lifting anything heavy like a gallon of milk.  ,  cold weather,  rain.      Pain Relieving Factors  sitting down       Pain Score  7   4 at rest   Pain Location  Shoulder    Pain Orientation  Right    Pain Descriptors / Indicators  Sharp    Pain Radiating Towards  into right forearm    Aggravating Factors   moving arm after deep cleaning kitchen    Pain Relieving Factors  rest,  shower,  heat         [facility] PT Assessment - 10/14/17 0001  Strength   Right Hip Flexion  4+/5    Right Hip ABduction  5/5    Left Hip Flexion  4+/5    Left Hip ABduction  5/5   pain brief in buttock                  [facility] Adult PT Treatment/Exercise - 10/14/17 0001      High Level Balance   High Level Balance Comments  balance exercise at counter and close SBA  hand to counter on occasion..  feet were sore neuopathy and callus.(  SP).   forward , back,  side step each side,   high march.  .        Lumbar Exercises: Aerobic   Nustep  L5  UE/LE  6 minutes   405 steps     Knee/Hip Exercises: Stretches   Gastroc Stretch  3 reps;30 seconds    Gastroc Stretch Limitations  small motion ,  both on step  cued initially      Knee/Hip Exercises: Standing   Heel Raises  5 reps   neuropathy pain,  both     Knee/Hip Exercises: Supine   Other Supine Knee/Hip Exercises  2 sets each , bent knee hip flexion in supine.   with 2 LBS around each  ankle     Moist Heat Therapy   Number Minutes Moist Heat  15 Minutes    Moist Heat Location  Shoulder   right,  extra layer            PT Education - 10/14/17 1749    Education Details  exercise form    Person(s) Educated  Patient    Methods  Explanation;Demonstration;Tactile cues;Verbal cues    Comprehension  Returned demonstration;Verbalized understanding       PT Short Term Goals - 09/22/17 0803      PT SHORT TERM GOAL #1   Title  Patient will increase passive right shoulder flexion to 115 degrees     Time  4    Period  Weeks    Status  Achieved      PT SHORT TERM GOAL #2   Title  Patient will increase lumbar flexion by 20 degrees without pain or loss of balance     Time  4    Period  Weeks    Status  Achieved      PT SHORT TERM GOAL #3   Title  Patient will increase passive shoulder ER to 40 degrees     Time  4    Period  Weeks    Status  Achieved        PT Long Term Goals - 10/12/17 1751      PT LONG TERM GOAL #1   Title  Patient will increse bilateral lower extremity strength to 4+/5 in order to improve functional mobility     Time  8    Period  Weeks    Status  Unable to assess      PT LONG TERM GOAL #2   Title  Patient will stand for 10 minutes without increased pain in order to perfrom her ADL's     Baseline  Patient sometimes can stand 10 minutes no pain.  if she already has pain when she stands the pain increases    Time  8    Period  Weeks    Status  Partially Met      PT LONG TERM GOAL #3   Title  Patient  will demonstrate a 55% limitation     Time  8    Period  Weeks    Status  Unable to assess            Plan - 10/14/17 1750    Clinical Impression Statement  Hip strength increased with hip flexion, see flow sheet. (Progress toward LTG#1) Patient had a pain flare from cleaning her kitchen all morning.  She had been feeling like she was getting a whole lot stronger and over did it.   Care take to void pain flare with exercise  today.     PT Next Visit Plan  core, shoulder strenghtening and stretching, modalites and MT PRN.  progress balance activities- air ex pad, pertibations for reactive balance; 4 way hip; marching/ toe taps.  consider gait belt for balance exercises    PT Home Exercise Plan  patient has HEP from previous sessions.  She was able to progress to red band exercises for [place]    Consulted and Agree with Plan of Care  Patient    Family Member Consulted  Son       Patient will benefit from skilled therapeutic intervention in order to improve the following deficits and impairments:     Visit Diagnosis: Chronic right shoulder pain  Chronic left-sided low back pain with left-sided sciatica  Difficulty in walking, not elsewhere classified  Repeated falls  Other abnormalities of gait and mobility  Unsteadiness on feet     Problem List Patient Active Problem List   Diagnosis Date Noted  . Left-sided back pain 08/05/2017  . Chronic right shoulder pain 08/05/2017  . Current moderate episode of major depressive disorder without prior episode ([place]) 08/05/2017  . Chest pain on exertion 07/22/2016  . Dyspnea on exertion 06/04/2016  . Diabetic peripheral neuropathy ([place]) 06/04/2016  . Dizziness 05/07/2016  . GERD (gastroesophageal reflux disease) 10/28/2013  . Healthcare maintenance 09/07/2012  . Hyperlipidemia 04/13/2008  . Essential hypertension 04/13/2008  . Coronary atherosclerosis 04/13/2008  . Type 2 diabetes mellitus with proliferative retinopathy without macular edema (HCC) 04/13/2008    Imanie Darrow PTA 10/14/2017, 5:57 PM  [facility] [address] [place], [place], [address] Phone: [phone]   Fax:  [phone]  Name: Destiny M Macioce MRN: [id number] Date of Birth: [birth year]

## 2017-10-14 NOTE — Progress Notes (Signed)
Internal Medicine Clinic Attending  Case discussed with Dr. Harbrecht at the time of the visit.  We reviewed the resident's history and exam and pertinent patient test results.  I agree with the assessment, diagnosis, and plan of care documented in the resident's note.   

## 2017-10-14 NOTE — Patient Instructions (Signed)
FOLLOW-UP INSTRUCTIONS When: 2 months For: Routine visit with your PCP Dr. Samuella Cota What to bring: All of your medications  I have increased your meal time insulin to 9 units with each meals. Please also balance your carbohydrate intake with each meal to better allow for tight blood glucose control.  Thank you for your visit to the [facility] today.

## 2017-10-19 ENCOUNTER — Ambulatory Visit: Payer: Medicare Other | Admitting: Physical Therapy

## 2017-10-19 VITALS — BP 124/71 | HR 76

## 2017-10-19 DIAGNOSIS — R2681 Unsteadiness on feet: Secondary | ICD-10-CM

## 2017-10-19 DIAGNOSIS — R262 Difficulty in walking, not elsewhere classified: Secondary | ICD-10-CM | POA: Diagnosis not present

## 2017-10-19 DIAGNOSIS — R2689 Other abnormalities of gait and mobility: Secondary | ICD-10-CM

## 2017-10-19 DIAGNOSIS — G8929 Other chronic pain: Secondary | ICD-10-CM

## 2017-10-19 DIAGNOSIS — R296 Repeated falls: Secondary | ICD-10-CM

## 2017-10-19 DIAGNOSIS — M5442 Lumbago with sciatica, left side: Secondary | ICD-10-CM | POA: Diagnosis not present

## 2017-10-19 DIAGNOSIS — M25511 Pain in right shoulder: Secondary | ICD-10-CM | POA: Diagnosis not present

## 2017-10-19 NOTE — Patient Instructions (Signed)
Deep Squat    Squat and lift with both arms held against upper trunk. Tighten stomach muscles without holding breath. Use smooth movements to avoid jerking.  Copyright  VHI. All rights reserved.  Can modify and hold onto table/ something.  To brace on as you lift and lower.  Look straight ahead.

## 2017-10-19 NOTE — Therapy (Signed)
[facility] [address] [place], [place], [address] Phone: [phone]   Fax:  [phone]  Physical Therapy Treatment  Patient Details  Name: Destiny Russell MRN: [id number] Date of Birth: [birth year] Referring Provider (PT): Dr Nyra Market    Encounter Date: 10/19/2017  PT End of Session - 10/19/17 1750    Visit Number  14    Number of Visits  18    Date for PT Re-Evaluation  11/02/17    Authorization Type  medicare kx modifier at 15     PT Start Time  1548    PT Stop Time  1630    PT Time Calculation (min)  42 min    Activity Tolerance  Patient tolerated treatment well    Behavior During Therapy  [facility] for tasks assessed/performed       Past Medical History:  Diagnosis Date  . Coronary atherosclerosis 04/13/2008   Cath in 2001 (for DOE) showed small vessel coronary disease; Myoview 2010 probable soft tissue attenuation but overall normal perfusion and normal LV function. An echocardiogram 2004 revealed normal LV function, mild LAE and mild MR. Myoview 2018 low risk.  Marland Kitchen GERD (gastroesophageal reflux disease)   . History of cataract surgery 03/01/2015  . Hyperlipidemia 04/13/2008   Declines statins - myalgias  . Hypertension   . Nail avulsion of toe 11/26/2015   right great toe - nontraumatic  . Parkinson's disease (HCC)    questionable; patient endorses diagnosis; no symptoms  . Type 2 diabetes mellitus with proliferative retinopathy without macular edema (HCC) 04/13/2008   [facility]: Severe proliferative retinopathy with clinically significant macular edema, stable vitreous hemorrhage of left eye, and stable age-related nuclear cataract of both eyes.  08/09/14: [facility]: Bilateral rapidly progressive diabetic retinopathy with no macular edema     Past Surgical History:  Procedure Laterality Date  . CARDIAC CATHETERIZATION    . cataract    . CESAREAN SECTION      Vitals:    10/19/17 1628  BP: 124/71  Pulse: 76                      [facility] Adult PT Treatment/Exercise - 10/19/17 0001      High Level Balance   High Level Balance Comments  patient did not mention foot pain today.Marland Kitchen  at counter  static atndem  able to hold 6 seconds best  ,  walking forward/ reverse  high march  toe tap to stool.  rocker board PF/DF,  side to side rocks needed hands and CGA    /  close SBA  noted she was a little dizzy when balance over .        Therapeutic Activites    Therapeutic Activities  Lifting    Lifting  kettle bell floor, knee and mid shin heights,, mod cues  able to do correctly after cues without pain.        Lumbar Exercises: Aerobic   Nustep  L5  UE/LE  6 minutes   508 steps     Lumbar Exercises: Seated   Other Seated Lumbar Exercises  sit to stand 5 times             PT Education - 10/19/17 1750    Education Details  lifting       PT Short Term Goals - 09/22/17 0803      PT SHORT TERM GOAL #1   Title  Patient will increase passive right shoulder flexion to 115 degrees     Time  4    Period  Weeks    Status  Achieved      PT SHORT TERM GOAL #2   Title  Patient will increase lumbar flexion by 20 degrees without pain or loss of balance     Time  4    Period  Weeks    Status  Achieved      PT SHORT TERM GOAL #3   Title  Patient will increase passive shoulder ER to 40 degrees     Time  4    Period  Weeks    Status  Achieved        PT Long Term Goals - 10/19/17 1754      PT LONG TERM GOAL #1   Title  Patient will increse bilateral lower extremity strength to 4+/5 in order to improve functional mobility     Time  8    Period  Weeks    Status  Unable to assess      PT LONG TERM GOAL #3   Title  Patient will demonstrate a 55% limitation     Time  8    Period  Weeks    Status  Unable to assess            Plan - 10/19/17 1751    Clinical Impression Statement  patient was able to demonstrate correct lifting of  5 LBS from floor and other heights after instruction.   Balance exercises more hallanging today.    Pain 4/10 in low back after balance exercises ( she did not mention as she was exercising)  Pain eased with a brief sitting rest as i took her BP  ( new BP Meds )  she was dizzy  with exercise.   and did not tell me.      PT Next Visit Plan  core, shoulder strenghtening and stretching, modalites and MT PRN.  progress balance activities- air ex pad, pertibations for reactive balance; 4 way hip; marching/ toe taps.  consider gait belt for balance exercises    PT Home Exercise Plan  patient has HEP from previous sessions.  She was able to progress to red band exercises for [place]    Consulted and Agree with Plan of Care  Patient       Patient will benefit from skilled therapeutic intervention in order to improve the following deficits and impairments:     Visit Diagnosis: Chronic right shoulder pain  Chronic left-sided low back pain with left-sided sciatica  Difficulty in walking, not elsewhere classified  Repeated falls  Other abnormalities of gait and mobility  Unsteadiness on feet     Problem List Patient Active Problem List   Diagnosis Date Noted  . Left-sided back pain 08/05/2017  . Chronic right shoulder pain 08/05/2017  . Current moderate episode of major depressive disorder without prior episode (HCC) 08/05/2017  . Chest pain on exertion 07/22/2016  . Dyspnea on exertion 06/04/2016  . Diabetic peripheral neuropathy (HCC) 06/04/2016  . Dizziness 05/07/2016  . GERD (gastroesophageal reflux disease) 10/28/2013  . Healthcare maintenance 09/07/2012  . Hyperlipidemia 04/13/2008  . Essential hypertension 04/13/2008  . Coronary atherosclerosis 04/13/2008  . Type 2 diabetes mellitus with proliferative retinopathy without macular edema (HCC) 04/13/2008    HARRIS,KAREN  PTA 10/19/2017, 5:57 PM  [facility] [address] [place], [place], [address] Phone: [phone]  Fax:  [phone]  Name: Destiny M Wisehart MRN: [id number] Date of Birth: 29-Aug-1950

## 2017-10-21 ENCOUNTER — Ambulatory Visit: Payer: Medicare Other | Admitting: Physical Therapy

## 2017-10-21 VITALS — BP 156/80 | HR 71

## 2017-10-21 DIAGNOSIS — R2681 Unsteadiness on feet: Secondary | ICD-10-CM

## 2017-10-21 DIAGNOSIS — M25511 Pain in right shoulder: Principal | ICD-10-CM

## 2017-10-21 DIAGNOSIS — G8929 Other chronic pain: Secondary | ICD-10-CM | POA: Diagnosis not present

## 2017-10-21 DIAGNOSIS — R262 Difficulty in walking, not elsewhere classified: Secondary | ICD-10-CM

## 2017-10-21 DIAGNOSIS — M5442 Lumbago with sciatica, left side: Secondary | ICD-10-CM | POA: Diagnosis not present

## 2017-10-21 DIAGNOSIS — R2689 Other abnormalities of gait and mobility: Secondary | ICD-10-CM | POA: Diagnosis not present

## 2017-10-21 DIAGNOSIS — R296 Repeated falls: Secondary | ICD-10-CM | POA: Diagnosis not present

## 2017-10-21 NOTE — Therapy (Signed)
[facility] [address] [place], [place], [address] Phone: [phone]   Fax:  [phone]  Physical Therapy Treatment  Patient Details  Name: Destiny Russell MRN: [id number] Date of Birth: August 10, 1950 Referring Provider (PT): Dr Nyra Market    Encounter Date: 10/21/2017  PT End of Session - 10/21/17 1637    Visit Number  15    Number of Visits  18    Date for PT Re-Evaluation  11/02/17    Authorization Type  medicare kx modifier at 15     PT Start Time  1550    PT Stop Time  1630    PT Time Calculation (min)  40 min    Activity Tolerance  Patient tolerated treatment well    Behavior During Therapy  [facility] for tasks assessed/performed       Past Medical History:  Diagnosis Date  . Coronary atherosclerosis 04/13/2008   Cath in 2001 (for DOE) showed small vessel coronary disease; Myoview 2010 probable soft tissue attenuation but overall normal perfusion and normal LV function. An echocardiogram 2004 revealed normal LV function, mild LAE and mild MR. Myoview 2018 low risk.  Marland Kitchen GERD (gastroesophageal reflux disease)   . History of cataract surgery 03/01/2015  . Hyperlipidemia 04/13/2008   Declines statins - myalgias  . Hypertension   . Nail avulsion of toe 11/26/2015   right great toe - nontraumatic  . Parkinson's disease (HCC)    questionable; patient endorses diagnosis; no symptoms  . Type 2 diabetes mellitus with proliferative retinopathy without macular edema (HCC) 04/13/2008   [facility]: Severe proliferative retinopathy with clinically significant macular edema, stable vitreous hemorrhage of left eye, and stable age-related nuclear cataract of both eyes.  08/09/14: [facility]: Bilateral rapidly progressive diabetic retinopathy with no macular edema     Past Surgical History:  Procedure Laterality Date  . CARDIAC CATHETERIZATION    . cataract    . CESAREAN SECTION      Vitals:    10/21/17 1556  BP: (!) 156/80  Pulse: 71    Subjective Assessment - 10/21/17 1556    Subjective  I am not really havibg any pain.  I was able to clean around the wood stove without pain.  i may be sore tomorrow.      Patient is accompained by:  Family member    Pain Location  Shoulder    Pain Orientation  Right         [facility] PT Assessment - 10/21/17 0001      AROM   Right Shoulder Flexion  70 Degrees   3/10   Right Shoulder ABduction  100 Degrees   4-5/10 pain   Right Shoulder Internal Rotation  --   T1  3/10 pain     Strength   Right Hip Flexion  5/5    Left Hip Flexion  4+/5                   [facility] Adult PT Treatment/Exercise - 10/21/17 0001      Lumbar Exercises: Stretches   Double Knee to Chest Stretch  1 rep    Double Knee to Chest Stretch Limitations  legs on ball    Lower Trunk Rotation  3 reps    Lower Trunk Rotation Limitations  5 seconds,  legs on black ball each , cues initially    Other Lumbar Stretch Exercise  ankles on ball with abdominal bracing  hip/knee flexion 5 x fatigued quickly   to avoid back pops brace abdominals harder/  helpful     Lumbar Exercises: Aerobic   Nustep  L5  UE/Le  6 minutes   401,  6 minutes     Lumbar Exercises: Supine   Pelvic Tilt  5 reps    Pelvic Tilt Limitations  cued for breathing    Bent Knee Raise  10 reps    4   LBS left   Bridge  10 reps   legs on black ball.    Large Ball Abdominal Isometric Limitations  10 x    Large Ball Oblique Isometric Limitations  10 x each side  RT/LT    Other Supine Lumbar Exercises  Ball squeeze 10 x cued initially for breathing.             PT Education - 10/21/17 1632    Education Details  HEP    Person(s) Educated  Patient;Child(ren)    Methods  Explanation;Tactile cues;Verbal cues;Handout    Comprehension  Returned demonstration;Verbalized understanding       PT Short Term Goals - 09/22/17 0803      PT SHORT TERM GOAL #1   Title  Patient will  increase passive right shoulder flexion to 115 degrees     Time  4    Period  Weeks    Status  Achieved      PT SHORT TERM GOAL #2   Title  Patient will increase lumbar flexion by 20 degrees without pain or loss of balance     Time  4    Period  Weeks    Status  Achieved      PT SHORT TERM GOAL #3   Title  Patient will increase passive shoulder ER to 40 degrees     Time  4    Period  Weeks    Status  Achieved        PT Long Term Goals - 10/21/17 1741      PT LONG TERM GOAL #1   Title  Patient will increse bilateral lower extremity strength to 4+/5 in order to improve functional mobility     Baseline  ALL 4=/5 TO 5/5    Time  8    Period  Weeks    Status  Achieved      PT LONG TERM GOAL #2   Title  Patient will stand for 10 minutes without increased pain in order to perfrom her ADL's     Time  8    Period  Weeks    Status  Unable to assess      PT LONG TERM GOAL #3   Title  Patient will demonstrate a 55% limitation     Time  8    Period  Weeks    Status  Unable to assess            Plan - 10/21/17 1638    Clinical Impression Statement  Shoulder AROM improving see flow sheet.  Shoulder ROM end range painful.  Patient did not want to work on shoulder she feels she cand use it foe what she needs.  Core work focus today.  Right hip flexion improved to 5/5,  Left was 4+/5.  this was addressed with exercise.     PT Next Visit Plan    Check  BP  , core, shoulder strenghtening and stretching, modalites and MT PRN.  progress balance activities- air ex pad, pertibations for reactive  balance; 4 way hip; marching/ toe taps.  consider gait belt for balance exercises    PT Home Exercise Plan  patient has HEP from previous sessions.  She was able to progress to red band exercises for [place],  Bridges with ball.    Consulted and Agree with Plan of Care  Family member/caregiver    Family Member Consulted  Son       Patient will benefit from skilled therapeutic intervention in  order to improve the following deficits and impairments:     Visit Diagnosis: Chronic right shoulder pain  Chronic left-sided low back pain with left-sided sciatica  Difficulty in walking, not elsewhere classified  Other abnormalities of gait and mobility  Unsteadiness on feet     Problem List Patient Active Problem List   Diagnosis Date Noted  . Left-sided back pain 08/05/2017  . Chronic right shoulder pain 08/05/2017  . Current moderate episode of major depressive disorder without prior episode (HCC) 08/05/2017  . Chest pain on exertion 07/22/2016  . Dyspnea on exertion 06/04/2016  . Diabetic peripheral neuropathy (HCC) 06/04/2016  . Dizziness 05/07/2016  . GERD (gastroesophageal reflux disease) 10/28/2013  . Healthcare maintenance 09/07/2012  . Hyperlipidemia 04/13/2008  . Essential hypertension 04/13/2008  . Coronary atherosclerosis 04/13/2008  . Type 2 diabetes mellitus with proliferative retinopathy without macular edema (HCC) 04/13/2008    Montae Stager  pta 10/21/2017, 5:44 PM  [facility] [address] [place], [place], [address] Phone: [phone]   Fax:  [phone]  Name: Destiny Russell MRN: [id number] Date of Birth: [birth year]

## 2017-10-21 NOTE — Patient Instructions (Signed)
Bridge With [employer] on back, both legs on top of ball, knees slightly flexed, arms on floor. Tighten buttocks while keeping abdominals tight and raise hips _3-6__ inches off floor. Do __1_ sets of _10__ repetitions. Advanced: Do not use arms for support.  Copyright  VHI. All rights reserved.

## 2017-10-26 ENCOUNTER — Ambulatory Visit: Payer: Medicare Other | Admitting: Physical Therapy

## 2017-10-27 ENCOUNTER — Telehealth: Payer: Self-pay | Admitting: Dietician

## 2017-10-27 NOTE — Telephone Encounter (Signed)
I called [employer] to be sure they have what they need from our office to send [place] her CGM. They do not have her in their system yet. They request that  Destiny Russell call them to set up and account, then they will request paperwork from [place] and link it to her account. Called Destiny.Boulter and informed her of their number and what she needed to do to set up and account. She agreed to call me after she has done that,

## 2017-10-28 ENCOUNTER — Ambulatory Visit: Payer: Medicare Other | Admitting: Physical Therapy

## 2017-10-28 DIAGNOSIS — R262 Difficulty in walking, not elsewhere classified: Secondary | ICD-10-CM

## 2017-10-28 DIAGNOSIS — R296 Repeated falls: Secondary | ICD-10-CM | POA: Diagnosis not present

## 2017-10-28 DIAGNOSIS — M25511 Pain in right shoulder: Principal | ICD-10-CM

## 2017-10-28 DIAGNOSIS — R2689 Other abnormalities of gait and mobility: Secondary | ICD-10-CM | POA: Diagnosis not present

## 2017-10-28 DIAGNOSIS — M5442 Lumbago with sciatica, left side: Secondary | ICD-10-CM | POA: Diagnosis not present

## 2017-10-28 DIAGNOSIS — G8929 Other chronic pain: Secondary | ICD-10-CM | POA: Diagnosis not present

## 2017-10-29 ENCOUNTER — Telehealth: Payer: Self-pay | Admitting: Dietician

## 2017-10-29 ENCOUNTER — Encounter: Payer: Self-pay | Admitting: Physical Therapy

## 2017-10-29 NOTE — Telephone Encounter (Signed)
[facility]- for his mom, account set up, but no patient ID yet. He will call when they get one.  [employer]- for his dad, our doctor is not in [facility] system so they still have not gotten them. I told him i'd call [facility].

## 2017-10-29 NOTE — Therapy (Signed)
Destiny Russell, [place], [address] Phone: [phone]   Fax:  [phone]  Physical Therapy Treatment/ Re-cert   Patient Details  Name: Destiny Russell MRN: [id number] Date of Birth: 09/12/1950 Referring Provider (PT): Dr Alphonzo Grieve    Encounter Date: 10/28/2017  PT End of Session - 10/29/17 1038    Visit Number  16    Number of Visits  18    Date for PT Re-Evaluation  11/26/17    Authorization Type  medicare kx modifier at 15     PT Start Time  1545    PT Stop Time  1627    PT Time Calculation (min)  42 min    Activity Tolerance  Patient tolerated treatment well    Behavior During Therapy  [facility] for tasks assessed/performed       Past Medical History:  Diagnosis Date  . Coronary atherosclerosis 04/13/2008   Cath in 2001 (for DOE) showed small vessel coronary disease; Myoview 2010 probable soft tissue attenuation but overall normal perfusion and normal LV function. An echocardiogram 2004 revealed normal LV function, mild LAE and mild MR. Myoview 2018 low risk.  Marland Kitchen GERD (gastroesophageal reflux disease)   . History of cataract surgery 03/01/2015  . Hyperlipidemia 04/13/2008   Declines statins - myalgias  . Hypertension   . Nail avulsion of toe 11/26/2015   right great toe - nontraumatic  . Parkinson's disease ([place])    questionable; patient endorses diagnosis; no symptoms  . Type 2 diabetes mellitus with proliferative retinopathy without macular edema ([place]) 04/13/2008   [facility]: Severe proliferative retinopathy with clinically significant macular edema, stable vitreous hemorrhage of left eye, and stable age-related nuclear cataract of both eyes.  08/09/14[facility]: Bilateral rapidly progressive diabetic retinopathy with no macular edema     Past Surgical History:  Procedure Laterality Date  . CARDIAC CATHETERIZATION    . cataract    . CESAREAN SECTION       There were no vitals filed for this visit.  Subjective Assessment - 10/29/17 1026    Subjective  Patient picked up her grandchild over the weekend and had some pain on the left side of her lower back. She feels like her shoulder have been improving. She also feels like her balance has been improving.     Limitations  Standing;Walking    How long can you sit comfortably?  depends but has to shift often     How long can you stand comfortably?  >5 min     How long can you walk comfortably?  limited community distances with a cane     Diagnostic tests  nothing recent in the shoulder and hip     Patient Stated Goals  to have less pain     Currently in Pain?  Yes    Pain Location  Back    Pain Orientation  Right;Left    Pain Descriptors / Indicators  Aching    Pain Type  Acute pain    Pain Onset  In the past 7 days    Pain Frequency  Constant    Aggravating Factors   reaching back and reaching into impingement     Pain Relieving Factors  chloroseptic spray     Pain Score  5    Pain Location  Buttocks    Pain Orientation  Right;Left    Pain Descriptors / Indicators  Aching  Pain Onset  More than a month ago    Pain Frequency  Intermittent    Aggravating Factors   lifting anything over a gallon of milk     Pain Relieving Factors  sitting down     Pain Score  7    Pain Location  Shoulder    Pain Orientation  Right    Pain Descriptors / Indicators  Sharp    Pain Radiating Towards  into the forearm     Aggravating Factors   moving the arm         [facility] PT Assessment - 10/29/17 0001      AROM   Right Shoulder Flexion  113 Degrees    Right Shoulder ABduction  98 Degrees    Right Shoulder Internal Rotation  --   L5   Right Shoulder External Rotation  --   abl to reach behind head withpain   Lumbar Flexion  63   pain upon extending back   Lumbar Extension  12    Lumbar - Right Side Bend  no pain    Lumbar - Left Side Bend  --   worse pain in low back was with L side  bending    Lumbar - Right Rotation  No limitations   pain in left   Lumbar - Left Rotation  no limitations '      Strength   Right Shoulder Flexion  4/5    Right Shoulder Internal Rotation  4+/5    Right Shoulder External Rotation  4+/5    Left Shoulder Flexion  5/5    Left Shoulder Internal Rotation  5/5    Left Shoulder External Rotation  5/5    Left Hip Flexion  5/5    Left Hip ADduction  5/5      High Level Balance   High Level Balance Comments  tandem stance right in front lfet in back 30sec without assist; tandem with left in front and right in back 17 sec with mn guard assist for recovery; narrow base 30sec                   [facility] Adult PT Treatment/Exercise - 10/29/17 0001      Neuro Re-ed    Neuro Re-ed Details   Pt performed standing balance activity on blue mat on top of incline and decline;  marching in place with no head turns EO, head turns side to side, up/down with targets with EO and then with EC with CGA; alternate stepping up/back and down back 10 reps each foot with CGA; walking forwards and backwards on mat on incline and decline, with no head turns then progressing to head turns horizontally both forwards and backwards      Lumbar Exercises: Stretches   Active Hamstring Stretch Limitations  seated 2x30 sec hold     Lower Trunk Rotation Limitations  5 seconds,  legs on black ball each , cues initially    Other Lumbar Stretch Exercise  piriformis stretch 3x20 sec hold       Lumbar Exercises: Supine   Clam Limitations  1x10 green    Bridge  10 reps    Large Ball Abdominal Isometric Limitations  15x   fatigued at 15   Other Supine Lumbar Exercises  Ball squeeze 10x      Manual Therapy   Soft tissue mobilization  tirgger point release to lumbar spine and upper glutes in side lying  PT Education - 10/29/17 1038    Education Details  reviewed current HEP     Person(s) Educated  Patient    Methods  Explanation;Tactile  cues;Demonstration;Verbal cues    Comprehension  Verbalized understanding;Returned demonstration;Verbal cues required;Tactile cues required       PT Short Term Goals - 10/29/17 1050      PT SHORT TERM GOAL #1   Title  Patient will increase passive right shoulder flexion to 115 degrees     Baseline  140 degrees     Time  4    Period  Weeks    Status  Achieved      PT SHORT TERM GOAL #2   Title  Patient will increase lumbar flexion by 20 degrees without pain or loss of balance     Baseline  no pain or lack of balance     Time  4    Period  Weeks    Status  Achieved      PT SHORT TERM GOAL #3   Title  Patient will increase passive shoulder ER to 40 degrees     Time  4    Period  Weeks    Status  On-going        PT Long Term Goals - 10/29/17 1103      PT LONG TERM GOAL #1   Title  Patient will increse bilateral lower extremity strength to 4+/5 in order to improve functional mobility     Baseline  4+/5 bilateral LE strength     Time  8    Period  Weeks    Status  Achieved      PT LONG TERM GOAL #2   Title  Patient will stand for 10 minutes without increased pain in order to perfrom her ADL's     Baseline  Patient sometimes can stand 10 minutes no pain.  if she already has pain when she stands the pain increases    Time  8    Period  Weeks    Status  Partially Met      PT LONG TERM GOAL #3   Title  Patient will demonstrate a 55% limitation     Baseline  49% limitation on FOTO     Time  8    Period  Weeks    Status  Achieved      PT LONG TERM GOAL #4   Title  Increase FOTO from 45/100 to 50/100 and increase DHI score from 82% to </= 60% to demo improvement in vertigo.  06-18-16    Baseline  45/100 with risk adjusted 47/100:  [place] 82%; score 8% on 07-13-16    Time  4    Period  Weeks    Status  Achieved            Plan - 10/29/17 1039    Clinical Impression Statement  Patient is approaching max benefit from therapy. Her shoulder motion has improved and her  shoulder strength has improved. She still has some pain in her back but it is intermittent and activity releasted. She has reached her FOTO goal. Her balance was improved today with tsting. She was able to maintain tandem stance. She did have some difficult with tandem stance with eyes closed. The patient missed two visits because of illness. She owould benefit from those 2 visits to finalize her HEP for balance, her back, and her neck. See below for goals specific progress.     Clinical Presentation  Evolving    Clinical Decision Making  Moderate    Rehab Potential  Good    PT Treatment/Interventions  ADLs/Self Care Home Management;Cryotherapy;Occupational psychologist;Therapeutic activities;Therapeutic exercise;Patient/family education;Neuromuscular re-education;Manual techniques;Taping;Passive range of motion    PT Next Visit Plan    Check  BP  , core, shoulder strenghtening and stretching, modalites and MT PRN.  progress balance activities- air ex pad, pertibations for reactive balance; 4 way hip; marching/ toe taps.  consider gait belt for balance exercises    PT Home Exercise Plan  patient has HEP from previous sessions.  She was able to progress to red band exercises for [place],  Bridges with ball.    Consulted and Agree with Plan of Care  Patient    Family Member Consulted  Son       Patient will benefit from skilled therapeutic intervention in order to improve the following deficits and impairments:  Pain, Postural dysfunction, Increased muscle spasms, Decreased activity tolerance, Decreased endurance, Decreased range of motion, Decreased strength, Impaired UE functional use  Visit Diagnosis: Chronic right shoulder pain - Plan: PT plan of care cert/re-cert  Chronic left-sided low back pain with left-sided sciatica - Plan: PT plan of care cert/re-cert  Difficulty in walking, not elsewhere classified - Plan: PT plan of care cert/re-cert     Problem  List Patient Active Problem List   Diagnosis Date Noted  . Left-sided back pain 08/05/2017  . Chronic right shoulder pain 08/05/2017  . Current moderate episode of major depressive disorder without prior episode ([place]) 08/05/2017  . Chest pain on exertion 07/22/2016  . Dyspnea on exertion 06/04/2016  . Diabetic peripheral neuropathy ([place]) 06/04/2016  . Dizziness 05/07/2016  . GERD (gastroesophageal reflux disease) 10/28/2013  . Healthcare maintenance 09/07/2012  . Hyperlipidemia 04/13/2008  . Essential hypertension 04/13/2008  . Coronary atherosclerosis 04/13/2008  . Type 2 diabetes mellitus with proliferative retinopathy without macular edema ([place]) 04/13/2008    Carney Living PT DPT  10/29/2017, 11:23 AM   Einar Crow SPT  10/29/2017   [facility] [address] [place], [place], [address] Phone: [phone]   Fax:  [phone]  Name: Destiny M Vore MRN: [id number] Date of Birth: 04-19-1950

## 2017-11-08 ENCOUNTER — Encounter: Payer: Self-pay | Admitting: Physical Therapy

## 2017-11-08 ENCOUNTER — Ambulatory Visit
Payer: Medicare Other | Attending: Student in an Organized Health Care Education/Training Program | Admitting: Physical Therapy

## 2017-11-08 DIAGNOSIS — R2681 Unsteadiness on feet: Secondary | ICD-10-CM | POA: Diagnosis not present

## 2017-11-08 DIAGNOSIS — M25511 Pain in right shoulder: Secondary | ICD-10-CM | POA: Diagnosis not present

## 2017-11-08 DIAGNOSIS — G8929 Other chronic pain: Secondary | ICD-10-CM | POA: Diagnosis not present

## 2017-11-08 DIAGNOSIS — R262 Difficulty in walking, not elsewhere classified: Secondary | ICD-10-CM | POA: Diagnosis not present

## 2017-11-08 DIAGNOSIS — M5442 Lumbago with sciatica, left side: Secondary | ICD-10-CM | POA: Insufficient documentation

## 2017-11-08 NOTE — Therapy (Addendum)
[place], [place], [address] Phone: [phone]   Fax:  [phone]  Physical Therapy Treatment  Patient Details  Name: Destiny Russell MRN: [id number] Date of Birth: 1950-08-03 Referring Provider (PT): Dr Alphonzo Grieve    Encounter Date: 11/08/2017  PT End of Session - 11/08/17 1657    Visit Number  17    Number of Visits  18    Date for PT Re-Evaluation  11/26/17    Authorization Type  medicare kx modifier at 15     PT Start Time  1415    PT Stop Time  1500    PT Time Calculation (min)  45 min    Equipment Utilized During Treatment  Gait belt    Activity Tolerance  Patient tolerated treatment well    Behavior During Therapy  [facility] for tasks assessed/performed       Past Medical History:  Diagnosis Date  . Coronary atherosclerosis 04/13/2008   Cath in 2001 (for DOE) showed small vessel coronary disease; Myoview 2010 probable soft tissue attenuation but overall normal perfusion and normal LV function. An echocardiogram 2004 revealed normal LV function, mild LAE and mild MR. Myoview 2018 low risk.  Marland Kitchen GERD (gastroesophageal reflux disease)   . History of cataract surgery 03/01/2015  . Hyperlipidemia 04/13/2008   Declines statins - myalgias  . Hypertension   . Nail avulsion of toe 11/26/2015   right great toe - nontraumatic  . Parkinson's disease ([place])    questionable; patient endorses diagnosis; no symptoms  . Type 2 diabetes mellitus with proliferative retinopathy without macular edema ([place]) 04/13/2008   [facility]: Severe proliferative retinopathy with clinically significant macular edema, stable vitreous hemorrhage of left eye, and stable age-related nuclear cataract of both eyes.  08/09/14[facility]: Bilateral rapidly progressive diabetic retinopathy with no macular edema     Past Surgical History:  Procedure Laterality Date  . CARDIAC CATHETERIZATION     . cataract    . CESAREAN SECTION      There were no vitals filed for this visit.  Subjective Assessment - 11/08/17 1421    Subjective  Patient reported she was walking in the pugh at church on Sunday and went to shake someones hand when she lost her balance, but was able to fall back into pugh so did not get hurt. Low back pain continues to be painful anytime she goes to lift something. If just sitting around not doing much pain in low back isn't bad at all. No pain in shoulder today.     Patient is accompained by:  Family member    Limitations  Standing;Walking    How long can you sit comfortably?  depends but has to shift often     How long can you stand comfortably?  >5 min     How long can you walk comfortably?  limited community distances with a cane     Diagnostic tests  nothing recent in the shoulder and hip     Patient Stated Goals  to have less pain     Currently in Pain?  Yes    Pain Score  4    when lifting   Pain Location  Back    Pain Orientation  Right    Pain Descriptors / Indicators  Aching    Pain Type  Acute pain    Pain Radiating Towards  No radiating pain today  Pain Onset  In the past 7 days    Pain Frequency  Constant    Aggravating Factors   reaching back    Pain Relieving Factors  chloroseptic spray     Effect of Pain on Daily Activities  unable to lift very heavy without pain                        [facility] Adult PT Treatment/Exercise - 11/08/17 0001      Neuro Re-ed    Neuro Re-ed Details   Performed standing balance activity on air ex including narrow base with EO & EC, tandem stance EO EC, single leg stance without blue mat 2x10 sec with cues to not stabilize with opposite leg; peritbations in all directions and varying speeds starting at hips and progressing to shoulders      Lumbar Exercises: Stretches   Lower Trunk Rotation Limitations  1x10     Other Lumbar Stretch Exercise  piriformis stretch 3x20 sec hold     Other Lumbar  Stretch Exercise  manual hamstring stretch 2x20sec       Lumbar Exercises: Aerobic   Nustep  L4  UE/Le  5 minutes      Lumbar Exercises: Standing   Other Standing Lumbar Exercises  mini squats with max cues for form 1x5      Lumbar Exercises: Supine   Clam Limitations  2x10 green    Bridge  10 reps   green t-band around knees   Other Supine Lumbar Exercises  2x10 reverse crunches ; Bil hip flexion with green t-band resistance 2x10      Manual Therapy   Manual Therapy  Soft tissue mobilization to paraspinals ; trigger point release to paraspinals    Soft tissue mobilization  Soft tissue mobiliztion              PT Education - 11/08/17 1606    Education Details  Reviewed HEP; lifting techniques     Person(s) Educated  Patient    Methods  Explanation;Demonstration;Tactile cues;Verbal cues    Comprehension  Verbalized understanding;Returned demonstration       PT Short Term Goals - 11/08/17 1755      PT SHORT TERM GOAL #1   Title  Patient will increase passive right shoulder flexion to 115 degrees     Status  Achieved      PT SHORT TERM GOAL #2   Title  Patient will increase lumbar flexion by 20 degrees without pain or loss of balance     Baseline  no pain or lack of balance     Status  Achieved      PT SHORT TERM GOAL #3   Title  Patient will increase passive shoulder ER to 40 degrees     Period  Weeks    Status  On-going        PT Long Term Goals - 11/08/17 1755      PT LONG TERM GOAL #1   Title  Patient will increse bilateral lower extremity strength to 4+/5 in order to improve functional mobility     Baseline  4+/5 bilateral LE strength     Time  8    Status  Achieved      PT LONG TERM GOAL #2   Title  Patient will stand for 10 minutes without increased pain in order to perfrom her ADL's     Status  Partially Met      PT LONG TERM  GOAL #3   Title  Patient will demonstrate a 55% limitation     Baseline  49% limitation on FOTO     Status  Achieved       PT LONG TERM GOAL #4   Title  Increase FOTO from 45/100 to 50/100 and increase DHI score from 82% to </= 60% to demo improvement in vertigo.  06-18-16    Baseline  45/100 with risk adjusted 47/100:  [place] 82%; score 8% on 07-13-16    Status  Achieved            Plan - 11/08/17 1700    Clinical Impression Statement  Patient reports no shoulder pain and pain in low back primarily when lifting items from the floor. Reports she almost fell at church, but the pugh stopped her fall. Instructed pt in mini squats with max cues for form while educating on lifting techniques. Pertibations provided to hip and shoulders with narrow base of support with pt demonstrating good ankle and hip strategy in order to correct balance. Pt nearing discharge and would benefit from list of of exercises for shoulder, low back, and balance.    History and Personal Factors relevant to plan of care:  DM 2 with peripheral neuropathy ; arthritis in multiple joints     Clinical Presentation  Evolving    Clinical Presentation due to:  increasing pain in her back and shoulders, frequent falls     Clinical Decision Making  Moderate    Rehab Potential  Good    PT Frequency  2x / week    PT Duration  8 weeks    PT Treatment/Interventions  ADLs/Self Care Home Management;Cryotherapy;Occupational psychologist;Therapeutic activities;Therapeutic exercise;Patient/family education;Neuromuscular re-education;Manual techniques;Taping;Passive range of motion    PT Next Visit Plan  give printout of exercises for shoulder, back and balance at d/c ; instruct family member on safe guarding in order for pt to cont. to practice balance drills; discuss lifting techniques     PT Home Exercise Plan  patient has HEP from previous sessions.  She was able to progress to red band exercises for [place],  Bridges with ball.    Consulted and Agree with Plan of Care  Patient    Family Member Consulted  Son       Patient will  benefit from skilled therapeutic intervention in order to improve the following deficits and impairments:  Pain, Postural dysfunction, Increased muscle spasms, Decreased activity tolerance, Decreased endurance, Decreased range of motion, Decreased strength, Impaired UE functional use  Visit Diagnosis: Chronic right shoulder pain  Chronic left-sided low back pain with left-sided sciatica  Unsteadiness on feet  Difficulty in walking, not elsewhere classified     Problem List Patient Active Problem List   Diagnosis Date Noted  . Left-sided back pain 08/05/2017  . Chronic right shoulder pain 08/05/2017  . Current moderate episode of major depressive disorder without prior episode ([place]) 08/05/2017  . Chest pain on exertion 07/22/2016  . Dyspnea on exertion 06/04/2016  . Diabetic peripheral neuropathy ([place]) 06/04/2016  . Dizziness 05/07/2016  . GERD (gastroesophageal reflux disease) 10/28/2013  . Healthcare maintenance 09/07/2012  . Hyperlipidemia 04/13/2008  . Essential hypertension 04/13/2008  . Coronary atherosclerosis 04/13/2008  . Type 2 diabetes mellitus with proliferative retinopathy without macular edema ([place]) 04/13/2008   Carolyne Littles PT DPT  11/08/2017    Einar Crow SPT 11/08/2017, 5:57 PM  During this treatment session, the therapist was present, participating in and directing the treatment.  [place], [place], [address] Phone: [phone]   Fax:  [phone]  Name: Destiny Russell MRN: [id number] Date of Birth: 11-11-1950

## 2017-11-10 ENCOUNTER — Encounter: Payer: Self-pay | Admitting: Physical Therapy

## 2017-11-10 ENCOUNTER — Ambulatory Visit: Payer: Medicare Other | Admitting: Physical Therapy

## 2017-11-10 DIAGNOSIS — R2681 Unsteadiness on feet: Secondary | ICD-10-CM

## 2017-11-10 DIAGNOSIS — M5442 Lumbago with sciatica, left side: Secondary | ICD-10-CM

## 2017-11-10 DIAGNOSIS — R262 Difficulty in walking, not elsewhere classified: Secondary | ICD-10-CM

## 2017-11-10 DIAGNOSIS — G8929 Other chronic pain: Secondary | ICD-10-CM | POA: Diagnosis not present

## 2017-11-10 DIAGNOSIS — M25511 Pain in right shoulder: Secondary | ICD-10-CM | POA: Diagnosis not present

## 2017-11-10 NOTE — Therapy (Addendum)
[place], [place], [address] Phone: [phone]   Fax:  [phone]  Physical Therapy Treatment & Discharge   Patient Details  Name: Destiny Russell MRN: [id number] Date of Birth: 04/14/1950 Referring Provider (PT): Dr Alphonzo Grieve    Encounter Date: 11/10/2017  PT End of Session - 11/10/17 1150    Visit Number  18    Number of Visits  18    Date for PT Re-Evaluation  11/26/17    Authorization Type  medicare kx modifier at 15     Activity Tolerance  Patient tolerated treatment well    Behavior During Therapy  [facility] for tasks assessed/performed       Past Medical History:  Diagnosis Date  . Coronary atherosclerosis 04/13/2008   Cath in 2001 (for DOE) showed small vessel coronary disease; Myoview 2010 probable soft tissue attenuation but overall normal perfusion and normal LV function. An echocardiogram 2004 revealed normal LV function, mild LAE and mild MR. Myoview 2018 low risk.  Marland Kitchen GERD (gastroesophageal reflux disease)   . History of cataract surgery 03/01/2015  . Hyperlipidemia 04/13/2008   Declines statins - myalgias  . Hypertension   . Nail avulsion of toe 11/26/2015   right great toe - nontraumatic  . Parkinson's disease ([place])    questionable; patient endorses diagnosis; no symptoms  . Type 2 diabetes mellitus with proliferative retinopathy without macular edema ([place]) 04/13/2008   [facility]: Severe proliferative retinopathy with clinically significant macular edema, stable vitreous hemorrhage of left eye, and stable age-related nuclear cataract of both eyes.  08/09/14[facility]: Bilateral rapidly progressive diabetic retinopathy with no macular edema     Past Surgical History:  Procedure Laterality Date  . CARDIAC CATHETERIZATION    . cataract    . CESAREAN SECTION      There were no vitals filed for this visit.  Subjective Assessment - 11/10/17  1150    Subjective  Pt denies back or shoulder pain. Has not had any LOB over past day. A little sore from previous visit that didn't last long.     Patient is accompained by:  Family member    Limitations  Standing;Walking    How long can you sit comfortably?  depends but has to shift often     How long can you stand comfortably?  >5 min     How long can you walk comfortably?  limited community distances with a cane     Diagnostic tests  nothing recent in the shoulder and hip     Patient Stated Goals  to have less pain     Currently in Pain?  No/denies    Pain Radiating Towards  no radiating pain     Pain Onset  In the past 7 days    Pain Frequency  Constant    Aggravating Factors   reaching back     Pain Relieving Factors  chloroseptic spray     Effect of Pain on Daily Activities  unable to lift very heavy without pain     Pain Score  0    Pain Score  0                       [facility] Adult PT Treatment/Exercise - 11/10/17 0001      Neuro Re-ed    Neuro Re-ed Details   Tandem stance on air ex pad (Bil) with EO  30sec each way & EC; narrow base of support on air ex with head turns 1x10; single leg stance able to hold up to 8secs on left leg, right leg up to 5 sec with CGA       Lumbar Exercises: Stretches   Lower Trunk Rotation Limitations  1x10     Other Lumbar Stretch Exercise  piriformis stretch 3x20 sec hold     Other Lumbar Stretch Exercise  single knee to chest 3x20sec      Lumbar Exercises: Aerobic   Nustep  L3 UE/Le  5 minutes      Lumbar Exercises: Supine   Clam Limitations  2x10 green    Bridge  10 reps   2x10   Other Supine Lumbar Exercises  Alt. hip maching 2x10 with cues for core engagment       Shoulder Exercises: Standing   External Rotation  Right;10 reps    Theraband Level (Shoulder External Rotation)  Level 1 (Yellow)    Internal Rotation  10 reps    Theraband Level (Shoulder Internal Rotation)  Level 1 (Yellow)    Extension  Strengthening;10  reps    Theraband Level (Shoulder Extension)  Level 2 (Red)    Row  15 reps    Theraband Level (Shoulder Row)  Level 2 (Red)    Other Standing Exercises  Bil standing external rotation with cues to keep arms tucked into side 1x10              PT Education - 11/10/17 1153    Education Details  Revied final HEP; safe guarding tech.    Person(s) Educated  Patient    Methods  Explanation;Demonstration;Tactile cues;Verbal cues    Comprehension  Verbalized understanding;Returned demonstration       PT Short Term Goals - 11/10/17 1239      PT SHORT TERM GOAL #1   Title  Patient will increase passive right shoulder flexion to 115 degrees     Baseline  140 degrees     Status  Achieved      PT SHORT TERM GOAL #2   Title  Patient will increase lumbar flexion by 20 degrees without pain or loss of balance     Status  Achieved      PT SHORT TERM GOAL #3   Title  Patient will increase passive shoulder ER to 40 degrees     Baseline  Per visual inspection actively ER to approx. 50 deg    Time  4    Period  Weeks    Status  Achieved        PT Long Term Goals - 11/10/17 1203      PT LONG TERM GOAL #1   Title  Patient will increse bilateral lower extremity strength to 4+/5 in order to improve functional mobility     Status  Achieved      PT LONG TERM GOAL #2   Title  Patient will stand for 10 minutes without increased pain in order to perfrom her ADL's     Baseline  No pain performing ADLs standing >10 minutes     Time  8    Period  Weeks    Status  Achieved      PT LONG TERM GOAL #3   Title  Patient will demonstrate a 55% limitation     Baseline  49% limitation on FOTO     Status  Achieved      PT LONG TERM GOAL #4  Title  Increase FOTO from 45/100 to 50/100 and increase DHI score from 82% to </= 60% to demo improvement in vertigo.  06-18-16    Baseline  45/100 with risk adjusted 47/100:  [place] 82%; score 8% on 07-13-16    Status  Achieved            Plan - 11/10/17  1246    Clinical Impression Statement  Patient denies shoulder or low back pain at time of session. Patient has achieved all short and long term goals. Reviewed final HEP with min cues for posture and core engagement . Patient has made great progress through out therapy and believe she has reached max potential and is appropriate for discharge.     History and Personal Factors relevant to plan of care:  DM 2 with peripheral neuropathy; arthritis in multiple joints    Clinical Presentation  Evolving    Clinical Presentation due to:  increasing pain in her back and shoulders, frequent falls     Clinical Decision Making  Moderate    Rehab Potential  Good    PT Frequency  2x / week    PT Duration  8 weeks    PT Treatment/Interventions  ADLs/Self Care Home Management;Cryotherapy;Occupational psychologist;Therapeutic activities;Therapeutic exercise;Patient/family education;Neuromuscular re-education;Manual techniques;Taping;Passive range of motion    PT Home Exercise Plan  Piriformis stretch, knee to chest, trunk rotations, clamshells, bridge, knee marches with ab sets;  Rows, extension, IR,ER, balance progression on airex pad     Consulted and Agree with Plan of Care  Patient    Family Member Consulted  Son       Patient will benefit from skilled therapeutic intervention in order to improve the following deficits and impairments:  Pain, Postural dysfunction, Increased muscle spasms, Decreased activity tolerance, Decreased endurance, Decreased range of motion, Decreased strength, Impaired UE functional use  Visit Diagnosis: Chronic right shoulder pain  Chronic left-sided low back pain with left-sided sciatica  Unsteadiness on feet  Difficulty in walking, not elsewhere classified    PHYSICAL THERAPY DISCHARGE SUMMARY  Visits from Start of Care:18  Current functional level related to goals / functional outcomes: Full HEP; significant improvement in shoulder  function; improved back pain    Remaining deficits: Intermittent back pain and loss of balance at times.    Education / Equipment: HEP   Plan: Patient agrees to discharge.  Patient goals were met. Patient is being discharged due to meeting the stated rehab goals.  ?????      Problem List Patient Active Problem List   Diagnosis Date Noted  . Left-sided back pain 08/05/2017  . Chronic right shoulder pain 08/05/2017  . Current moderate episode of major depressive disorder without prior episode ([place]) 08/05/2017  . Chest pain on exertion 07/22/2016  . Dyspnea on exertion 06/04/2016  . Diabetic peripheral neuropathy ([place]) 06/04/2016  . Dizziness 05/07/2016  . GERD (gastroesophageal reflux disease) 10/28/2013  . Healthcare maintenance 09/07/2012  . Hyperlipidemia 04/13/2008  . Essential hypertension 04/13/2008  . Coronary atherosclerosis 04/13/2008  . Type 2 diabetes mellitus with proliferative retinopathy without macular edema ([place]) 04/13/2008    Carolyne Littles PT DPT  11/10/2017  Einar Crow SPT 11/10/2017, 1:15 PM   During this treatment session, the therapist was present, participating in and directing the treatment.    [place], [place], [address] Phone: [phone]   Fax:  [phone]  Name: Destiny M Wilber MRN: [id number] Date of Birth: 03-04-1950

## 2017-11-11 ENCOUNTER — Other Ambulatory Visit: Payer: Self-pay | Admitting: *Deleted

## 2017-11-11 DIAGNOSIS — F321 Major depressive disorder, single episode, moderate: Secondary | ICD-10-CM

## 2017-11-11 MED ORDER — DULOXETINE HCL 60 MG PO CPEP: 60.0000 mg | ORAL_CAPSULE | Freq: Every day | ORAL | 1 refills | Status: DC

## 2017-11-11 NOTE — Telephone Encounter (Signed)
Next appt scheduled 12/11 with PCP.

## 2017-11-15 ENCOUNTER — Telehealth: Payer: Self-pay | Admitting: *Deleted

## 2017-11-15 DIAGNOSIS — F321 Major depressive disorder, single episode, moderate: Secondary | ICD-10-CM

## 2017-11-15 NOTE — Telephone Encounter (Signed)
Clarifiaction:  Duloxetine 60 mg Take 1 capsule (60 mg total) by mouth daily. Then after one week, increase to 2 capsules (60mg  total) by mouth daily. Does it suppose to be 30 mg daily then increase to 60 mg (total))?  Or 60 mg daily then increase to 2 caps (120 mg total)? Thanks

## 2017-11-16 MED ORDER — DULOXETINE HCL 60 MG PO CPEP: 60.0000 mg | ORAL_CAPSULE | Freq: Every day | ORAL | 1 refills | Status: DC

## 2017-11-16 NOTE — Telephone Encounter (Signed)
I thought I had changed that before sending in the script, sorry! It's just 60mg  once daily. Script has been changed and sent in. Thank you!  Candida Peeling

## 2017-11-18 ENCOUNTER — Telehealth: Payer: Self-pay | Admitting: *Deleted

## 2017-11-18 DIAGNOSIS — F321 Major depressive disorder, single episode, moderate: Secondary | ICD-10-CM

## 2017-11-18 NOTE — Telephone Encounter (Signed)
Pharmacist from [facility] calls and states pt's caregivePearlandr/ son is upset. He states his mother has never taken 60mg  of cymbalta she is good with 30 daily, to pharmacist knowledge pt has not even tried the 60mg  but son states she will not be taking more, she will stay at 30mg  daily. Please advise.

## 2017-11-19 MED ORDER — DULOXETINE HCL 30 MG PO CPEP: 30.0000 mg | ORAL_CAPSULE | Freq: Every day | ORAL | 1 refills | Status: DC

## 2017-11-19 NOTE — Telephone Encounter (Signed)
[facility] - stating need new rx for Duloxetine 30 mg; she was never increased to 60 mg. Thanks

## 2017-11-19 NOTE — Telephone Encounter (Signed)
That's fine; I resent the script for 30mg  duloxetine daily.  Destiny Russell

## 2017-11-26 ENCOUNTER — Telehealth: Payer: Self-pay | Admitting: Internal Medicine

## 2017-11-26 NOTE — Telephone Encounter (Signed)
Pls call [phone] [facility] for Diabetic meters

## 2017-11-26 NOTE — Telephone Encounter (Signed)
I spoke with [facility] today: They need a different signature on the written detailed order for her Continuous glucose monitor because Dr. Samuella CotaSvalina is not registered in [facility] providers. They also request clinical notes that state Destiny Russell's testing and injecting insulin daily frequencies.  The fax is left with Destiny Russell with instructions on what needs to be completed and to please fax.

## 2017-11-29 ENCOUNTER — Telehealth: Payer: Self-pay | Admitting: *Deleted

## 2017-11-29 NOTE — Telephone Encounter (Incomplete)
Information was faxed to [facility] for CGM for patient.  Angelina OkGladys Herbin, RN 11/29/2017 8:40 AM.

## 2017-12-10 NOTE — Telephone Encounter (Signed)
Received a second written detailed:

## 2017-12-15 ENCOUNTER — Encounter: Payer: Medicare Other | Admitting: Internal Medicine

## 2017-12-16 NOTE — Telephone Encounter (Signed)
Called [facility], they are in the process of getting her CGM order ready, order should go out today or tomorrow depending on need for additional patient information. They do not need any further documentation from our office.

## 2017-12-19 ENCOUNTER — Other Ambulatory Visit: Payer: Self-pay | Admitting: Internal Medicine

## 2017-12-19 DIAGNOSIS — I10 Essential (primary) hypertension: Secondary | ICD-10-CM

## 2018-01-13 ENCOUNTER — Telehealth: Payer: Self-pay | Admitting: Dietician

## 2018-01-13 DIAGNOSIS — E113599 Type 2 diabetes mellitus with proliferative diabetic retinopathy without macular edema, unspecified eye: Secondary | ICD-10-CM

## 2018-01-13 DIAGNOSIS — Z794 Long term (current) use of insulin: Principal | ICD-10-CM

## 2018-01-13 NOTE — Telephone Encounter (Signed)
Ms. Destiny Russell received her personal [employer] Flash Continuous glucose monitoring and has not used it yet. She is waiting for training. She agreed to an appointment with me after she sees her doctor next week for training. She will bring the box and all it's contents for training and also says her son will accompany her. Request referral.

## 2018-01-17 ENCOUNTER — Other Ambulatory Visit: Payer: Self-pay | Admitting: Internal Medicine

## 2018-01-17 DIAGNOSIS — F321 Major depressive disorder, single episode, moderate: Secondary | ICD-10-CM

## 2018-01-17 DIAGNOSIS — I10 Essential (primary) hypertension: Secondary | ICD-10-CM

## 2018-01-17 NOTE — Telephone Encounter (Signed)
Next appt scheduled 01/19/18 with PCP. 

## 2018-01-18 ENCOUNTER — Encounter: Payer: Self-pay | Admitting: *Deleted

## 2018-01-18 DIAGNOSIS — H43822 Vitreomacular adhesion, left eye: Secondary | ICD-10-CM | POA: Diagnosis not present

## 2018-01-18 DIAGNOSIS — E113592 Type 2 diabetes mellitus with proliferative diabetic retinopathy without macular edema, left eye: Secondary | ICD-10-CM | POA: Diagnosis not present

## 2018-01-18 DIAGNOSIS — H35372 Puckering of macula, left eye: Secondary | ICD-10-CM | POA: Diagnosis not present

## 2018-01-18 DIAGNOSIS — E113551 Type 2 diabetes mellitus with stable proliferative diabetic retinopathy, right eye: Secondary | ICD-10-CM | POA: Diagnosis not present

## 2018-01-18 LAB — HM DIABETES EYE EXAM

## 2018-01-19 ENCOUNTER — Encounter: Payer: Self-pay | Admitting: Internal Medicine

## 2018-01-19 ENCOUNTER — Encounter: Payer: Self-pay | Admitting: Dietician

## 2018-01-19 ENCOUNTER — Ambulatory Visit (INDEPENDENT_AMBULATORY_CARE_PROVIDER_SITE_OTHER): Payer: Medicare Other | Admitting: Internal Medicine

## 2018-01-19 ENCOUNTER — Other Ambulatory Visit: Payer: Self-pay

## 2018-01-19 ENCOUNTER — Ambulatory Visit (INDEPENDENT_AMBULATORY_CARE_PROVIDER_SITE_OTHER): Payer: Medicare Other | Admitting: Dietician

## 2018-01-19 VITALS — BP 124/55 | HR 73 | Temp 98.3°F | Ht 62.5 in | Wt 204.1 lb

## 2018-01-19 DIAGNOSIS — E113599 Type 2 diabetes mellitus with proliferative diabetic retinopathy without macular edema, unspecified eye: Secondary | ICD-10-CM

## 2018-01-19 DIAGNOSIS — Z794 Long term (current) use of insulin: Secondary | ICD-10-CM | POA: Diagnosis not present

## 2018-01-19 DIAGNOSIS — E785 Hyperlipidemia, unspecified: Secondary | ICD-10-CM | POA: Diagnosis not present

## 2018-01-19 DIAGNOSIS — R234 Changes in skin texture: Secondary | ICD-10-CM | POA: Diagnosis not present

## 2018-01-19 DIAGNOSIS — E782 Mixed hyperlipidemia: Secondary | ICD-10-CM

## 2018-01-19 DIAGNOSIS — I10 Essential (primary) hypertension: Secondary | ICD-10-CM | POA: Diagnosis not present

## 2018-01-19 DIAGNOSIS — Z7982 Long term (current) use of aspirin: Secondary | ICD-10-CM | POA: Diagnosis not present

## 2018-01-19 DIAGNOSIS — Z79899 Other long term (current) drug therapy: Secondary | ICD-10-CM

## 2018-01-19 DIAGNOSIS — F17220 Nicotine dependence, chewing tobacco, uncomplicated: Secondary | ICD-10-CM

## 2018-01-19 DIAGNOSIS — R51 Headache: Secondary | ICD-10-CM

## 2018-01-19 DIAGNOSIS — I2510 Atherosclerotic heart disease of native coronary artery without angina pectoris: Secondary | ICD-10-CM | POA: Diagnosis not present

## 2018-01-19 DIAGNOSIS — K219 Gastro-esophageal reflux disease without esophagitis: Secondary | ICD-10-CM

## 2018-01-19 DIAGNOSIS — L84 Corns and callosities: Secondary | ICD-10-CM

## 2018-01-19 DIAGNOSIS — R519 Headache, unspecified: Secondary | ICD-10-CM

## 2018-01-19 LAB — POCT GLYCOSYLATED HEMOGLOBIN (HGB A1C): HEMOGLOBIN A1C: 9.5 % — AB (ref 4.0–5.6)

## 2018-01-19 LAB — GLUCOSE, CAPILLARY: Glucose-Capillary: 114 mg/dL — ABNORMAL HIGH (ref 70–99)

## 2018-01-19 MED ORDER — PRAVASTATIN SODIUM 10 MG PO TABS: 10.0000 mg | ORAL_TABLET | Freq: Every evening | ORAL | 0 refills | Status: AC

## 2018-01-19 MED ORDER — GLUCOSE BLOOD VI STRP: ORAL_STRIP | 4 refills | Status: DC

## 2018-01-19 MED ORDER — INSULIN GLARGINE 100 UNIT/ML SOLOSTAR PEN: 42.0000 [IU] | PEN_INJECTOR | Freq: Every day | SUBCUTANEOUS | 2 refills | Status: AC

## 2018-01-19 NOTE — Progress Notes (Signed)
CC: HTN  HPI:  [name] is a 68 y.o. with a PMH of HTN, non-obstructive CAD, GERD, HLD, and T2DM presenting to clinic for follow up on HTN, diabetes, and evaluation of new headache.  HTN: Patient endorses compliance with amlodipine, metoprolol, and lisinopril. She denies chest pain, shortness of breath, vision or hearing changes. She has had headaches which are different from her previous tension-type, and began about 1-2 months ago (discussed below).   T2DM: Patient reports compliance with lantsu 42 units qhs, metformin 1000mg  daily and humalog 9 units TID with meals. She endorses checking her CBGs 4 times a day and reports readings fluctuating from 70-200s. She is symptomatic when CBG in 70 and she treats with small snack with improvement in symptoms.   Headache: Patient reports onset of new type of headaches for the last 1-2 months. Previously she has had tension type headaches, but now she will have HA involving top of her head that is sharp in nature. Some times these headaches are present when she wakes up but they don't wake her from sleep. HAs are not associated with vision or hearing changes, nausea, vomiting, focal weakness or numbness/tingling though she notes intermittent episodes of bil UE tingling that will last for several minutes that are unrelated to headaches. She denies trauma.   Please see problem based Assessment and Plan for status of patients chronic conditions.  Past Medical History:  Diagnosis Date  . Coronary atherosclerosis 04/13/2008   Cath in 2001 (for DOE) showed small vessel coronary disease; Myoview 2010 probable soft tissue attenuation but overall normal perfusion and normal LV function. An echocardiogram 2004 revealed normal LV function, mild LAE and mild MR. Myoview 2018 low risk.  Marland Kitchen GERD (gastroesophageal reflux disease)   . History of cataract surgery 03/01/2015  . Hyperlipidemia 04/13/2008   Declines statins - myalgias  . Hypertension   .  Nail avulsion of toe 11/26/2015   right great toe - nontraumatic  . Parkinson's disease (HCC)    questionable; patient endorses diagnosis; no symptoms  . Type 2 diabetes mellitus with proliferative retinopathy without macular edema (HCC) 04/13/2008   [facility]: Severe proliferative retinopathy with clinically significant macular edema, stable vitreous hemorrhage of left eye, and stable age-related nuclear cataract of both eyes.  08/09/14[facility]: Bilateral rapidly progressive diabetic retinopathy with no macular edema     Review of Systems:   Per HPI  Physical Exam:  Vitals:   01/19/18 1411  BP: (!) 124/55  Pulse: 73  Temp: 98.3 F (36.8 C)  TempSrc: Oral  SpO2: 98%  Weight: 204 lb 1.6 oz (92.6 kg)  Height: 5' 2.5" (1.588 m)   GENERAL- alert, co-operative, appears as stated age, not in any distress. HEENT- Atraumatic, normocephalic, PERRL, EOMI, oral mucosa appears moist. No TTP of cervical spine or surrounding musculature and occipital areas. No TTP at areas involving the headaches CARDIAC- RRR, no murmurs, rubs or gallops. RESP- Moving equal volumes of air, and clear to auscultation bilaterally, no wheezes or crackles. ABDOMEN- Soft, nontender, bowel sounds present. NEURO- CN 2-12 intact, strength and sensation intact throughout, mildly antalgic gait EXTREMITIES- pulse 1+PT/DP, symmetric, no pedal edema. Fissures on lateral aspect of right foot; calluses on both feet PSYCH- Normal mood and affect, appropriate thought content and speech.  Assessment & Plan:   See Encounters Tab for problem based charting.   Patient discussed with Dr. Valla Leaver, MD Internal Medicine PGY-3

## 2018-01-19 NOTE — Patient Instructions (Signed)
We'll order a brain scan due to your new headaches.  Come back in 2 weeks and we'll look at your sugars and adjust your insulin for better control

## 2018-01-20 LAB — BMP8+ANION GAP
Anion Gap: 20 mmol/L — ABNORMAL HIGH (ref 10.0–18.0)
BUN/Creatinine Ratio: 23 (ref 12–28)
BUN: 17 mg/dL (ref 8–27)
CHLORIDE: 99 mmol/L (ref 96–106)
CO2: 19 mmol/L — ABNORMAL LOW (ref 20–29)
Calcium: 9.6 mg/dL (ref 8.7–10.3)
Creatinine, Ser: 0.73 mg/dL (ref 0.57–1.00)
GFR calc Af Amer: 98 mL/min/{1.73_m2} (ref >59)
GFR calc non Af Amer: 85 mL/min/{1.73_m2} (ref >59)
Glucose: 109 mg/dL — ABNORMAL HIGH (ref 65–99)
POTASSIUM: 4.5 mmol/L (ref 3.5–5.2)
Sodium: 138 mmol/L (ref 134–144)

## 2018-01-20 NOTE — Progress Notes (Signed)
Documentation: Start time:1355           End time: 90  Educated patient and her son how to apply and use her personal [employer] Continuous glucose monitoring System. [name], who they plan to apply the sensor at home, successfully applied her sensor here in the office after instruction to demonstrate understanding. Destiny Russell successfully scanned her sensor two times before leaving the office with the second time giving her a result of 98 mg/dl per Dr. Samuella Cota. All instructions were given to patient both orally and written, they were referred to the [employer] site for support and  further information. She was instructed about when to use her glucometer.  Destiny Russell has already placed an order for her next month's supply fo sensors. Destiny Russell was asked to scan her sensor upon waking and immediately before bed and at least every 4 hours during waking hours.   Follow up suggested in 1-3 months with me for continued education on using Continuous glucose monitoring information optimally for Diabetes Self Management.  Destiny Russell, RD 01/20/2018 11:56 AM.

## 2018-01-21 ENCOUNTER — Encounter: Payer: Self-pay | Admitting: Internal Medicine

## 2018-01-21 DIAGNOSIS — R51 Headache: Secondary | ICD-10-CM

## 2018-01-21 DIAGNOSIS — R519 Headache, unspecified: Secondary | ICD-10-CM | POA: Insufficient documentation

## 2018-01-21 NOTE — Assessment & Plan Note (Signed)
Well controlled and asymptomatic. She is having new headaches, however these don't have characteristics of HTNsive HA.  Plan: --continue amlodipine 10mg  daily, metoprolol 100mg  daily, and lisinopril 40mg  daily --f/u in 3 months

## 2018-01-21 NOTE — Progress Notes (Signed)
Internal Medicine Clinic Attending  Case discussed with Dr. Svalina  at the time of the visit.  We reviewed the resident's history and exam and pertinent patient test results.  I agree with the assessment, diagnosis, and plan of care documented in the resident's note.  

## 2018-01-21 NOTE — Assessment & Plan Note (Addendum)
Patient with continued fluctuations and hypoglycemia on current regimen. She is checking her CBGs 4 times daily and is able to verbalize appropriate management of low CBGs.  She received and had her Dexcom CGM placed today.  Plan: --continue lantus 42 units qhs, metformin 1000mg  daily, and humalog 9 units TID --f/u in 2 weeks for CGM download to see how regimen could be optimized; she may benefit from a correction scale with humalog due to mainly snacking throughout the day instead of eating 3 meals. --Bmet - renal function stable --she is going to restart a statin - low intensity pravastatin 10mg  every other day and gradually titrate up if tolerating due to previous myalgias --refer to podiatry for fissures and calluses

## 2018-01-21 NOTE — Assessment & Plan Note (Signed)
Patient with new type of headache for her in the last month or so w/o associated neurologic symptoms or signs. HA is at times present on waking in the morning but does not wake her from sleep and is not associated with N/V.   As this is new headache and warrants imaging due to her age.  Plan: --f/u MRI brain

## 2018-01-21 NOTE — Assessment & Plan Note (Signed)
Patient states she has not been taking pravastatin and has not even started it. After discussion, she is willing to start taking pravastatin 10mg  every other day for now with possibility of titrating up the dose if she is tolerating it well.   Plan: --pravastatin 10mg  every other day and titrate up as tolerated

## 2018-01-27 ENCOUNTER — Other Ambulatory Visit: Payer: Self-pay | Admitting: *Deleted

## 2018-01-27 MED ORDER — GLUCOSE BLOOD VI STRP: ORAL_STRIP | 1 refills | Status: DC

## 2018-01-27 MED ORDER — ACCU-CHEK GUIDE ME W/DEVICE KIT: 1.0000 | PACK | Freq: Once | 0 refills | Status: AC

## 2018-02-02 ENCOUNTER — Ambulatory Visit (INDEPENDENT_AMBULATORY_CARE_PROVIDER_SITE_OTHER): Payer: Medicare Other | Admitting: Internal Medicine

## 2018-02-02 ENCOUNTER — Encounter: Payer: Self-pay | Admitting: Dietician

## 2018-02-02 ENCOUNTER — Encounter: Payer: Self-pay | Admitting: Internal Medicine

## 2018-02-02 ENCOUNTER — Other Ambulatory Visit: Payer: Self-pay | Admitting: *Deleted

## 2018-02-02 ENCOUNTER — Ambulatory Visit: Payer: Medicare Other | Admitting: Dietician

## 2018-02-02 VITALS — BP 134/62 | HR 82 | Temp 97.9°F | Ht 62.5 in | Wt 199.7 lb

## 2018-02-02 DIAGNOSIS — F17220 Nicotine dependence, chewing tobacco, uncomplicated: Secondary | ICD-10-CM | POA: Diagnosis not present

## 2018-02-02 DIAGNOSIS — Z7982 Long term (current) use of aspirin: Secondary | ICD-10-CM | POA: Diagnosis not present

## 2018-02-02 DIAGNOSIS — J0110 Acute frontal sinusitis, unspecified: Secondary | ICD-10-CM | POA: Insufficient documentation

## 2018-02-02 DIAGNOSIS — E113599 Type 2 diabetes mellitus with proliferative diabetic retinopathy without macular edema, unspecified eye: Secondary | ICD-10-CM

## 2018-02-02 DIAGNOSIS — I10 Essential (primary) hypertension: Secondary | ICD-10-CM

## 2018-02-02 DIAGNOSIS — R51 Headache: Secondary | ICD-10-CM

## 2018-02-02 DIAGNOSIS — E2839 Other primary ovarian failure: Secondary | ICD-10-CM

## 2018-02-02 DIAGNOSIS — I2510 Atherosclerotic heart disease of native coronary artery without angina pectoris: Secondary | ICD-10-CM

## 2018-02-02 DIAGNOSIS — Z1231 Encounter for screening mammogram for malignant neoplasm of breast: Secondary | ICD-10-CM

## 2018-02-02 DIAGNOSIS — E785 Hyperlipidemia, unspecified: Secondary | ICD-10-CM | POA: Diagnosis not present

## 2018-02-02 DIAGNOSIS — Z794 Long term (current) use of insulin: Secondary | ICD-10-CM

## 2018-02-02 DIAGNOSIS — R519 Headache, unspecified: Secondary | ICD-10-CM

## 2018-02-02 DIAGNOSIS — Z0000 Encounter for general adult medical examination without abnormal findings: Secondary | ICD-10-CM

## 2018-02-02 MED ORDER — LORAZEPAM 0.5 MG PO TABS: 0.5000 mg | ORAL_TABLET | Freq: Once | ORAL | 0 refills | Status: AC | PRN

## 2018-02-02 MED ORDER — AMOXICILLIN-POT CLAVULANATE 875-125 MG PO TABS: 1.0000 | ORAL_TABLET | Freq: Two times a day (BID) | ORAL | 0 refills | Status: AC

## 2018-02-02 MED ORDER — GLUCOSE BLOOD VI STRP: ORAL_STRIP | 1 refills | Status: DC

## 2018-02-02 NOTE — Assessment & Plan Note (Signed)
Appt made for patient for screening mammogram and dexa scan

## 2018-02-02 NOTE — Assessment & Plan Note (Signed)
Patient with continued headaches, however now she is more symptomatic from sinus headaches. She denies new neuro symptoms.  She has an MRI scheduled for next week. She asks for anxiolytic to help.  Plan: --ativan 0.5mg  prior to MRI; her son will drive her to appointment. Advised that she not operate any heavy machinery that day

## 2018-02-02 NOTE — Progress Notes (Signed)
   CC: T2DM  HPI:  Destiny Russell is a 67 y.o. with a PMH of T2DM, HTN, HLD, and non-obstructive CAD presenting to clinic for follow up on T2DM and evaluation of sinusitis.  T2DM: Patient taking Lantus 42 units qhs, humalog 9 units TID with meals, and metformin 1000mg  daily. She had personal CGM placed 2 weeks ago. From report, it has been intermittently displaced leading to no readings for various lengths of time every day; this likely led to incomplete calibration so hard to interpret the information.   Sinusitis: Patient states she has had sinusitis for about 1 month with symptoms of congestion, frontal headache, rhinorrhea, sneezing. Her symptoms initially improved, however last week worsened again; she continues to use OTC medicines without much relief. She denies fevers, chills.   Please see problem based Assessment and Plan for status of patients chronic conditions.  Past Medical History:  Diagnosis Date  . Coronary atherosclerosis 04/13/2008   Cath in 2001 (for DOE) showed small vessel coronary disease; Myoview 2010 probable soft tissue attenuation but overall normal perfusion and normal LV function. An echocardiogram 2004 revealed normal LV function, mild LAE and mild MR. Myoview 2018 low risk.  Marland Kitchen GERD (gastroesophageal reflux disease)   . History of cataract surgery 03/01/2015  . Hyperlipidemia 04/13/2008   Declines statins - myalgias  . Hypertension   . Nail avulsion of toe 11/26/2015   right great toe - nontraumatic  . Parkinson's disease (HCC)    questionable; patient endorses diagnosis; no symptoms  . Type 2 diabetes mellitus with proliferative retinopathy without macular edema (HCC) 04/13/2008   [facility]: Severe proliferative retinopathy with clinically significant macular edema, stable vitreous hemorrhage of left eye, and stable age-related nuclear cataract of both eyes.  08/09/14[facility]: Bilateral rapidly progressive  diabetic retinopathy with no macular edema     Review of Systems:   Per HPI  Physical Exam:  Vitals:   02/02/18 1342  BP: 134/62  Pulse: 82  Temp: 97.9 F (36.6 C)  TempSrc: Oral  SpO2: 99%  Weight: 199 lb 11.2 oz (90.6 kg)  Height: 5' 2.5" (1.588 m)   GENERAL- alert, co-operative, appears as stated age, not in any distress. HEENT- moist mucous membranes, no oropharyngeal exudates; nasal turbinate congestion; no sinus tenderness. CARDIAC- RRR, no murmurs, rubs or gallops. RESP- Moving equal volumes of air, and clear to auscultation bilaterally, no wheezes or crackles.  Assessment & Plan:   See Encounters Tab for problem based charting.   Patient discussed with Dr. Cecilie Kicks, MD Internal Medicine PGY-3

## 2018-02-02 NOTE — Patient Instructions (Addendum)
For your diabetes: Continue the metformin 1000mg  daily Increase Lantus to 46 units at night Take Humalog 9 units with meals, make sure to take it at least 4 hours apart. If it is a smaller snack, use 6 units.  For your sinuses: Take augmentin one pill twice a day for 5 days Use flonase nasal spray daily Use zyrtec or similar nondrowsy antihistamine  Prior to your MRI, take ativan 0.5mg  about 30 minutes before the scan.

## 2018-02-02 NOTE — Assessment & Plan Note (Signed)
Patient with 1 month h/o sinusitis that initially improved then acutely worsened in the last week despite OTC treatment, making this more likely to be secondary bacterial sinusitis.  Plan: --augmentin BID x5 days --advised continued use of flonase, antihistamine, decongestant

## 2018-02-02 NOTE — Patient Instructions (Addendum)
Ms. Hurry,  Bonita Quin are doing a great job with your new continuous glucose monitor!   Just a reminder: you can log your insulin and meals through the pencil icon. You can also order more [employer] through [facility] to help keep your CGM sticking on longer.   I suggest we follow up in 2-3 months for continued education on using Continuous glucose monitoring information to help with your Diabetes Self Management.   Take care!   Norm Parcel, RD 02/02/2018 1:42 PM.

## 2018-02-02 NOTE — Progress Notes (Signed)
Personal continuous glucose monitoring  Patient presents for follow-up of continuous glucose monitor with her son, and for new sensor placement support. Results were downloaded and copies provided for the patient and doctor.  CGM use % of time  78%  Average and SD  227 +/- 66.1  Time in range  25%  % Time Above 180  75%  % Time above 250  34%  % Time Below target  0%   Patient states she experienced a low blood sugar - CGM report shows one low of 59 mg/dL, average duration was 45 min and occurred during 3pm and 4pm.   Patient and son brought in their personal CGM supplies to be monitored and/or corrected when removing previous CGM and applying the new one. Her son was able to place the sensor with minimal assistance. Mrs. Dzik verbalizes confidence in suing the Continuous glucose monitor.   Norm Parcel, RD 02/02/2018 1:52 PM.

## 2018-02-02 NOTE — Assessment & Plan Note (Signed)
Patient here for review of personal CGM recently placed, however filament intermittently displaced every day of wear making it difficult to interpret and verify these readings.  Overall her DM is uncontrolled and she confirms that when she checks her CBG in the AM it is most often >200.  Plan: --increase Lantus to 46 units qhs --continue humalog 6-9 units with meals TID --continue metformin 1000mg  daily --f/u in 4 weeks with CGM; my hope is that we can start a correction scale for meals/snacks as she eats throughout the day

## 2018-02-03 ENCOUNTER — Other Ambulatory Visit: Payer: Self-pay | Admitting: *Deleted

## 2018-02-03 MED ORDER — GLUCOSE BLOOD VI STRP: ORAL_STRIP | 1 refills | Status: AC

## 2018-02-03 NOTE — Progress Notes (Signed)
Case discussed with Dr. Svalina at the time of the visit.  We reviewed the resident's history and exam and pertinent patient test results.  I agree with the assessment, diagnosis and plan of care documented in the resident's note. 

## 2018-02-07 ENCOUNTER — Ambulatory Visit (INDEPENDENT_AMBULATORY_CARE_PROVIDER_SITE_OTHER): Payer: Medicare Other | Admitting: Podiatrist

## 2018-02-07 ENCOUNTER — Ambulatory Visit (INDEPENDENT_AMBULATORY_CARE_PROVIDER_SITE_OTHER): Payer: Medicare Other

## 2018-02-07 ENCOUNTER — Other Ambulatory Visit: Payer: Self-pay | Admitting: Podiatrist

## 2018-02-07 DIAGNOSIS — L97511 Non-pressure chronic ulcer of other part of right foot limited to breakdown of skin: Secondary | ICD-10-CM | POA: Diagnosis not present

## 2018-02-07 DIAGNOSIS — M79671 Pain in right foot: Secondary | ICD-10-CM | POA: Diagnosis not present

## 2018-02-07 DIAGNOSIS — R0989 Other specified symptoms and signs involving the circulatory and respiratory systems: Secondary | ICD-10-CM

## 2018-02-07 NOTE — Patient Instructions (Signed)
Ulcer care instructions:  Cleanse your ulcer daily with warm water and a drop of dial antibacterial soap on a 4x4 gauze pad. Dry thoroughly and apply a pea sized amount of iodosorb to the wound.  Cover with gauze and wrap.  Then apply a loose fitting sock and wear your surgical shoe.  Stay off the foot as much as possible.  Call if any redness, swelling, ooze, pus or signs of infections arise.    [facility] will call you regarding the wound care suppies [phone]

## 2018-02-08 ENCOUNTER — Encounter: Payer: Self-pay | Admitting: Podiatrist

## 2018-02-08 NOTE — Progress Notes (Signed)
     Chief Complaint  Patient presents with  . Callouses    Right plantar callous fell off and now has wound 3wk duration pt denies fever/vomiting/nausea/chills. Callous was present 72yr.  . Nail Problem    Right 1st medial nail pain, keeps breaking off and bleeding.     HPI: Patient is 68 y.o. female who presents today for a wound on the right foot she has had for 3 weeks.  She states it started with a callus that was pulled off and the skin broke open. She has been applying neosporin to the wound and it has not improved.    Exam:  Neurovascular status unchanged with DP and PT pulses decreased bilaterally.  Capillary refill time is less than 3 seconds to all digits.  Right hallux nail bed does not appear to be infected.  The nail does appear to be growing and breaking off at the distal one third of the nailbed.  No sign of infection present  She does have a teardrop shaped ulcer on the plantar fourth interspace of the right foot it measures 0.5 cm in width 1 cm in length and 0.2 cm in depth it has a red wound bed with a central area of eschar it is a full-thickness ulceration.  No redness, no streaking, no undermining, no pus, no drainage is seen  Xray of the right foot was taken-  No sign of gas in the tissues is noted, no boney changes seen.   No sign of osteomyelitis seen.   Assessment  Initial visit for wound right foot plantar fourth interspace, healed right hallux nail   Plan  Discussed wound care with the patient and her caregiver.  I debrided the wound eschar as much as I could in the presence of her pain.  Silvadene cream was applied with a dry sterile dressing.  I will order her Iodosorb through [facility] and I gave her instructions for application of wound dressing.  A surgical shoe was also dispensed.  Recommended daily application of vaseline to the right hallux nail bed to soften the bed to allow for the nail to grow over.  She will try this.  And she will be seen back for  recheck in 2 week and will call if any signs or symptoms of infection arise.

## 2018-02-09 ENCOUNTER — Telehealth: Payer: Self-pay | Admitting: *Deleted

## 2018-02-09 ENCOUNTER — Ambulatory Visit (HOSPITAL_COMMUNITY)
Admission: RE | Admit: 2018-02-09 | Discharge: 2018-02-09 | Disposition: A | Discharge status: Home / Self Care | Payer: Medicare Other | Source: Ambulatory Visit | Attending: Internal Medicine | Admitting: Internal Medicine

## 2018-02-09 DIAGNOSIS — R519 Headache, unspecified: Secondary | ICD-10-CM

## 2018-02-09 DIAGNOSIS — R51 Headache: Secondary | ICD-10-CM | POA: Insufficient documentation

## 2018-02-09 NOTE — Telephone Encounter (Signed)
Dr. Irving Shows ordered Iodosorb gel to be applied daily to right 4th sub-metatarsal space L97.511, measuring 1.0 x 0.5 x 0.3cm ulcer with moderate exudate, from [facility]. Faxed orders to [facility].

## 2018-02-21 ENCOUNTER — Ambulatory Visit (INDEPENDENT_AMBULATORY_CARE_PROVIDER_SITE_OTHER): Payer: Medicare Other

## 2018-02-21 ENCOUNTER — Ambulatory Visit (INDEPENDENT_AMBULATORY_CARE_PROVIDER_SITE_OTHER): Payer: Medicare Other | Admitting: Podiatrist

## 2018-02-21 ENCOUNTER — Telehealth: Payer: Self-pay | Admitting: *Deleted

## 2018-02-21 ENCOUNTER — Encounter: Payer: Self-pay | Admitting: Podiatrist

## 2018-02-21 ENCOUNTER — Other Ambulatory Visit: Payer: Self-pay | Admitting: Podiatrist

## 2018-02-21 DIAGNOSIS — R0989 Other specified symptoms and signs involving the circulatory and respiratory systems: Secondary | ICD-10-CM

## 2018-02-21 DIAGNOSIS — L97521 Non-pressure chronic ulcer of other part of left foot limited to breakdown of skin: Secondary | ICD-10-CM | POA: Diagnosis not present

## 2018-02-21 DIAGNOSIS — L97512 Non-pressure chronic ulcer of other part of right foot with fat layer exposed: Secondary | ICD-10-CM | POA: Diagnosis not present

## 2018-02-21 DIAGNOSIS — M79671 Pain in right foot: Secondary | ICD-10-CM

## 2018-02-21 MED ORDER — CEPHALEXIN 500 MG PO CAPS: 500.0000 mg | ORAL_CAPSULE | Freq: Three times a day (TID) | ORAL | 0 refills | Status: AC

## 2018-02-21 NOTE — Progress Notes (Signed)
Patient presents with her son for follow up of ulcer right foot-  She states it is painful and doesn't appear to have impoved.  She has noticed no pus or drainage from the foot- she has been using iodosorb and a dressing.  She is using a borrowed wheelchair to stay off the foot.   Exam:  Neurovascular status unchanged with DP and PT pulses unable to be palpated right dp.   Capillary refill time is delayed.   neurological sensation intact and she is able to feel the pain of the ulceration   Continuation of a teardrop shaped ulcer on the plantar fourth interspace of the right foot it measures 0.5 cm in width 1 cm in length and 0.2 cm in depth with overlying stable eschar it is a full-thickness ulceration.  periwound redness is present today.    Xray of the right foot was taken- NO changes seen from the last xray-   No sign of gas in the tissues is noted, no boney changes seen.   No sign of osteomyelitis seen.    Assessment  First follow up for wound right foot plantar fourth interspace- likely arterial ulcer   Plan   Discussed wound care with the patient and her caregiver.  I debrided the periwound callux and left the eschar intact.   Silvadene cream was applied with a dry sterile dressing. She will continue to use iodosorb at home.  She will continue to wear surgical shoe and use the wheelchair-  I wrote a rx for a wheelchair for her to obtain as her own.  LE vascular studies will be ordered-  I will see her back in 1 week for recheck.  Keflex was ordered due to the increased periwound redness seen today.

## 2018-02-21 NOTE — Telephone Encounter (Signed)
[facility] - MEDICAID AUTHORIZATION NOTIFICATION STATES, "THIS [place] MEDICAID MEMBER DOES NOT REQUIRE PRIOR AUTHORIZATION FOR OUTPATIENT RADIOLOGY THROUGH [facility] OR [place] AT THIS TIME." Faxed to [facility]

## 2018-02-26 ENCOUNTER — Emergency Department (HOSPITAL_COMMUNITY): Payer: Medicare Other

## 2018-02-26 ENCOUNTER — Other Ambulatory Visit: Payer: Self-pay

## 2018-02-26 ENCOUNTER — Emergency Department (HOSPITAL_COMMUNITY)
Admission: EM | Admit: 2018-02-26 | Discharge: 2018-03-06 | Disposition: E | Payer: Medicare Other | Attending: Emergency Medicine | Admitting: Emergency Medicine

## 2018-02-26 ENCOUNTER — Encounter (HOSPITAL_COMMUNITY): Admission: EM | Disposition: E | Payer: Self-pay | Source: Home / Self Care | Attending: Emergency Medicine

## 2018-02-26 ENCOUNTER — Encounter (HOSPITAL_COMMUNITY): Payer: Self-pay | Admitting: Emergency Medicine

## 2018-02-26 DIAGNOSIS — R079 Chest pain, unspecified: Secondary | ICD-10-CM | POA: Diagnosis not present

## 2018-02-26 DIAGNOSIS — I10 Essential (primary) hypertension: Secondary | ICD-10-CM | POA: Diagnosis not present

## 2018-02-26 DIAGNOSIS — R0602 Shortness of breath: Secondary | ICD-10-CM | POA: Insufficient documentation

## 2018-02-26 DIAGNOSIS — L97511 Non-pressure chronic ulcer of other part of right foot limited to breakdown of skin: Secondary | ICD-10-CM

## 2018-02-26 DIAGNOSIS — I469 Cardiac arrest, cause unspecified: Secondary | ICD-10-CM | POA: Diagnosis present

## 2018-02-26 DIAGNOSIS — G20 Parkinson's disease: Secondary | ICD-10-CM | POA: Insufficient documentation

## 2018-02-26 DIAGNOSIS — Z794 Long term (current) use of insulin: Secondary | ICD-10-CM | POA: Diagnosis not present

## 2018-02-26 DIAGNOSIS — R4020 Unspecified coma: Secondary | ICD-10-CM | POA: Diagnosis not present

## 2018-02-26 DIAGNOSIS — Z7902 Long term (current) use of antithrombotics/antiplatelets: Secondary | ICD-10-CM | POA: Diagnosis not present

## 2018-02-26 DIAGNOSIS — E785 Hyperlipidemia, unspecified: Secondary | ICD-10-CM

## 2018-02-26 DIAGNOSIS — Z7982 Long term (current) use of aspirin: Secondary | ICD-10-CM | POA: Diagnosis not present

## 2018-02-26 DIAGNOSIS — F17220 Nicotine dependence, chewing tobacco, uncomplicated: Secondary | ICD-10-CM | POA: Diagnosis not present

## 2018-02-26 DIAGNOSIS — I2510 Atherosclerotic heart disease of native coronary artery without angina pectoris: Secondary | ICD-10-CM | POA: Diagnosis not present

## 2018-02-26 DIAGNOSIS — Z79899 Other long term (current) drug therapy: Secondary | ICD-10-CM | POA: Diagnosis not present

## 2018-02-26 DIAGNOSIS — R404 Transient alteration of awareness: Secondary | ICD-10-CM | POA: Diagnosis not present

## 2018-02-26 DIAGNOSIS — E119 Type 2 diabetes mellitus without complications: Secondary | ICD-10-CM | POA: Insufficient documentation

## 2018-02-26 DIAGNOSIS — I499 Cardiac arrhythmia, unspecified: Secondary | ICD-10-CM | POA: Diagnosis not present

## 2018-02-26 DIAGNOSIS — I447 Left bundle-branch block, unspecified: Secondary | ICD-10-CM

## 2018-02-26 DIAGNOSIS — I213 ST elevation (STEMI) myocardial infarction of unspecified site: Secondary | ICD-10-CM | POA: Diagnosis not present

## 2018-02-26 DIAGNOSIS — J181 Lobar pneumonia, unspecified organism: Secondary | ICD-10-CM | POA: Diagnosis not present

## 2018-02-26 HISTORY — PX: TEMPORARY PACEMAKER: CATH118268

## 2018-02-26 LAB — CBC WITH DIFFERENTIAL/PLATELET
Abs Immature Granulocytes: 0.66 10*3/uL — ABNORMAL HIGH (ref 0.00–0.07)
BASOS PCT: 1 %
Basophils Absolute: 0.1 10*3/uL (ref 0.0–0.1)
EOS ABS: 0.3 10*3/uL (ref 0.0–0.5)
Eosinophils Relative: 2 %
HCT: 48.3 % — ABNORMAL HIGH (ref 36.0–46.0)
Hemoglobin: 15.5 g/dL — ABNORMAL HIGH (ref 12.0–15.0)
Immature Granulocytes: 3 %
Lymphocytes Relative: 33 %
Lymphs Abs: 6.4 10*3/uL — ABNORMAL HIGH (ref 0.7–4.0)
MCH: 29.3 pg (ref 26.0–34.0)
MCHC: 32.1 g/dL (ref 30.0–36.0)
MCV: 91.3 fL (ref 80.0–100.0)
Monocytes Absolute: 0.5 10*3/uL (ref 0.1–1.0)
Monocytes Relative: 3 %
NRBC: 0.1 % (ref 0.0–0.2)
Neutro Abs: 11.2 10*3/uL — ABNORMAL HIGH (ref 1.7–7.7)
Neutrophils Relative %: 58 %
PLATELETS: 295 10*3/uL (ref 150–400)
RBC: 5.29 MIL/uL — ABNORMAL HIGH (ref 3.87–5.11)
RDW: 12.2 % (ref 11.5–15.5)
WBC: 19.2 10*3/uL — ABNORMAL HIGH (ref 4.0–10.5)

## 2018-02-26 LAB — COMPREHENSIVE METABOLIC PANEL
ALT: 72 U/L — ABNORMAL HIGH (ref 0–44)
AST: 128 U/L — ABNORMAL HIGH (ref 15–41)
Albumin: 3.2 g/dL — ABNORMAL LOW (ref 3.5–5.0)
Alkaline Phosphatase: 86 U/L (ref 38–126)
Anion gap: 22 — ABNORMAL HIGH (ref 5–15)
BUN: 22 mg/dL (ref 8–23)
CALCIUM: 8.2 mg/dL — AB (ref 8.9–10.3)
CO2: 14 mmol/L — ABNORMAL LOW (ref 22–32)
Chloride: 99 mmol/L (ref 98–111)
Creatinine, Ser: 2 mg/dL — ABNORMAL HIGH (ref 0.44–1.00)
GFR calc Af Amer: 29 mL/min — ABNORMAL LOW (ref >60)
GFR, EST NON AFRICAN AMERICAN: 25 mL/min — AB (ref >60)
Glucose, Bld: 497 mg/dL — ABNORMAL HIGH (ref 70–99)
Potassium: 4 mmol/L (ref 3.5–5.1)
Sodium: 135 mmol/L (ref 135–145)
Total Bilirubin: 1.2 mg/dL (ref 0.3–1.2)
Total Protein: 6.5 g/dL (ref 6.5–8.1)

## 2018-02-26 LAB — PROTIME-INR
INR: 1.19
Prothrombin Time: 15 seconds (ref 11.4–15.2)

## 2018-02-26 LAB — LIPID PANEL
Cholesterol: 183 mg/dL (ref 0–200)
HDL: 50 mg/dL (ref >40)
LDL Cholesterol: 111 mg/dL — ABNORMAL HIGH (ref 0–99)
Total CHOL/HDL Ratio: 3.7 RATIO
Triglycerides: 110 mg/dL (ref <150)
VLDL: 22 mg/dL (ref 0–40)

## 2018-02-26 LAB — APTT: aPTT: 34 seconds (ref 24–36)

## 2018-02-26 LAB — TROPONIN I: Troponin I: 1.03 ng/mL (ref <0.03)

## 2018-02-26 SURGERY — TEMPORARY PACEMAKER
Anesthesia: LOCAL

## 2018-02-26 MED ORDER — SODIUM BICARBONATE 8.4 % IV SOLN
INTRAVENOUS | Status: AC
  Filled 2018-02-26: qty 50

## 2018-02-26 MED ORDER — ROCURONIUM BROMIDE 50 MG/5ML IV SOLN
INTRAVENOUS | Status: AC | PRN
  Administered 2018-02-26: 75 mg via INTRAVENOUS

## 2018-02-26 MED ORDER — SODIUM BICARBONATE 8.4 % IV SOLN
INTRAVENOUS | Status: DC | PRN
  Administered 2018-02-26: 50 meq via INTRAVENOUS

## 2018-02-26 MED ORDER — EPINEPHRINE PF 1 MG/10ML IJ SOSY
PREFILLED_SYRINGE | INTRAMUSCULAR | Status: AC | PRN
  Administered 2018-02-26 (×4): 1 via INTRAVENOUS

## 2018-02-26 MED ORDER — NITROGLYCERIN 1 MG/10 ML FOR IR/CATH LAB
INTRA_ARTERIAL | Status: AC
  Filled 2018-02-26: qty 10

## 2018-02-26 MED ORDER — HEPARIN SODIUM (PORCINE) 5000 UNIT/ML IJ SOLN: 5000.0000 [IU] | Freq: Once | INTRAMUSCULAR | Status: DC

## 2018-02-26 MED ORDER — EPINEPHRINE PF 1 MG/10ML IJ SOSY
PREFILLED_SYRINGE | INTRAMUSCULAR | Status: DC | PRN
  Administered 2018-02-26 (×3): 1 mg via INTRAVENOUS

## 2018-02-26 MED ORDER — ETOMIDATE 2 MG/ML IV SOLN
INTRAVENOUS | Status: AC | PRN
  Administered 2018-02-26: 30 mg via INTRAVENOUS

## 2018-02-26 MED ORDER — LIDOCAINE HCL (PF) 1 % IJ SOLN
INTRAMUSCULAR | Status: AC
  Filled 2018-02-26: qty 30

## 2018-02-26 MED ORDER — SODIUM CHLORIDE 0.9 % IV SOLN
INTRAVENOUS | Status: DC
  Administered 2018-02-26: 1000 mL via INTRAVENOUS

## 2018-02-26 MED ORDER — EPINEPHRINE PF 1 MG/10ML IJ SOSY
PREFILLED_SYRINGE | INTRAMUSCULAR | Status: AC
  Filled 2018-02-26: qty 20

## 2018-02-26 MED ORDER — ASPIRIN 300 MG RE SUPP
300.0000 mg | Freq: Once | RECTAL | Status: AC
  Administered 2018-02-26: 300 mg via RECTAL
  Filled 2018-02-26: qty 1

## 2018-02-26 MED ORDER — EPINEPHRINE PF 1 MG/10ML IJ SOSY
PREFILLED_SYRINGE | INTRAMUSCULAR | Status: AC | PRN
  Administered 2018-02-26: 1 via INTRAVENOUS

## 2018-02-26 MED ORDER — SODIUM CHLORIDE 0.9 % IV SOLN
INTRAVENOUS | Status: AC | PRN
  Administered 2018-02-26: 1000 mL via INTRAVENOUS

## 2018-02-26 MED ORDER — DOPAMINE-DEXTROSE 3.2-5 MG/ML-% IV SOLN
INTRAVENOUS | Status: AC | PRN
  Administered 2018-02-26: 10 ug/kg/min via INTRAVENOUS

## 2018-02-26 MED ORDER — HEPARIN (PORCINE) IN NACL 1000-0.9 UT/500ML-% IV SOLN
INTRAVENOUS | Status: AC
  Filled 2018-02-26: qty 1000

## 2018-02-26 MED ORDER — DOPAMINE-DEXTROSE 3.2-5 MG/ML-% IV SOLN
INTRAVENOUS | Status: AC | PRN
  Administered 2018-02-26: 15 ug/kg/min via INTRAVENOUS

## 2018-02-26 MED FILL — Medication: Qty: 1 | Status: AC

## 2018-02-26 SURGICAL SUPPLY — 12 items
CABLE ADAPT CONN TEMP 6FT (ADAPTER) ×1 IMPLANT
CATH S G BIP PACING (CATHETERS) ×1 IMPLANT
GLIDESHEATH SLEND SS 6F .021 (SHEATH) ×1 IMPLANT
GUIDEWIRE INQWIRE 1.5J.035X260 (WIRE) IMPLANT
INQWIRE 1.5J .035X260CM (WIRE) ×2
KIT ENCORE 26 ADVANTAGE (KITS) ×1 IMPLANT
KIT HEART LEFT (KITS) ×2 IMPLANT
PACK CARDIAC CATHETERIZATION (CUSTOM PROCEDURE TRAY) ×2 IMPLANT
SHEATH PINNACLE 6F 10CM (SHEATH) ×1 IMPLANT
SLEEVE REPOSITIONING LENGTH 30 (MISCELLANEOUS) ×1 IMPLANT
TRANSDUCER W/STOPCOCK (MISCELLANEOUS) ×2 IMPLANT
TUBING CIL FLEX 10 FLL-RA (TUBING) ×2 IMPLANT

## 2018-02-28 ENCOUNTER — Encounter (HOSPITAL_COMMUNITY): Payer: Self-pay | Admitting: Internal Medicine

## 2018-02-28 ENCOUNTER — Telehealth: Payer: Self-pay | Admitting: Internal Medicine

## 2018-02-28 NOTE — Telephone Encounter (Signed)
Routing to Dr End to sign next time in the office. Placing certificate in his office basket.

## 2018-02-28 NOTE — Telephone Encounter (Signed)
Ok.  I will complete the death certificate tomorrow.  Thanks.  Yvonne Kendall, MD [facility] Pager: [phone]

## 2018-02-28 NOTE — Telephone Encounter (Signed)
[employer] dropped off Death Certificate to be completed and signed Placed in nurse box

## 2018-03-01 NOTE — Telephone Encounter (Signed)
Called and spoke to receptionist and let her know the death certificate was signed and ready for pick-up. Placed at front desk for pick up.

## 2018-03-06 NOTE — Progress Notes (Signed)
   2018-03-04 0240  Clinical Encounter Type  Visited With Family  Visit Type Initial  Referral From Nurse  Consult/Referral To Chaplain  The chaplain responded to Code Stemi in [facility].  The chaplain checked in with MD for Pt. update.  The chaplain joined the MD in family meeting with Pt. husband, son, and daughter.  The chaplain escorted the  family to Cath Lab waiting area.  The chaplain is available for F/U spiritual care as needed.

## 2018-03-06 NOTE — ED Notes (Signed)
Patient to Cath lab with RN, EMT, RT and Dr End.

## 2018-03-06 NOTE — ED Provider Notes (Addendum)
[facility] EMERGENCY DEPARTMENT Provider Note  CSN: [id number] Arrival date & time: 02/28/2018 0207  Chief Complaint(s) Cardiac Arrest and Code STEMI  HPI Destiny Russell is a 68 y.o. female who presents to the emergency department after cardiac arrest.  Per EMS patient began having shortness of breath and chest pain prompting her to have her son call EMS.  While awaiting EMS, patient collapsed.  The son began doing chest compressions until first responders got there.  When EMS arrived they noted the patient was in asystole.  ACLS was continued and patient was given 2 mg of epinephrine resulting in return of spontaneous circulation.  King airway was inserted.  While in route patient coded again and had CPR performed and ROSC was obtained.  Code STEMI was initiated.  HPI  Remainder of history, ROS, and physical exam limited due to patient's condition (unresponsive). Additional information was obtained from EMS.   Level V Caveat.   Past Medical History Past Medical History:  Diagnosis Date  . Coronary atherosclerosis 04/13/2008   Cath in 2001 (for [facility]) showed small vessel coronary disease; Myoview 2010 probable soft tissue attenuation but overall normal perfusion and normal LV function. An echocardiogram 2004 revealed normal LV function, mild LAE and mild MR. Myoview 2018 low risk.  Marland Kitchen GERD (gastroesophageal reflux disease)   . History of cataract surgery 03/01/2015  . Hyperlipidemia 04/13/2008   Declines statins - myalgias  . Hypertension   . Nail avulsion of toe 11/26/2015   right great toe - nontraumatic  . Parkinson's disease (HCC)    questionable; patient endorses diagnosis; no symptoms  . Type 2 diabetes mellitus with proliferative retinopathy without macular edema (HCC) 04/13/2008   06/06/14 [facility]: Severe proliferative retinopathy with clinically significant macular edema, stable vitreous hemorrhage of left eye, and stable age-related nuclear  cataract of both eyes.  08/09/14: [facility]: Bilateral rapidly progressive diabetic retinopathy with no macular edema    Patient Active Problem List   Diagnosis Date Noted  . Acute non-recurrent frontal sinusitis 02/02/2018  . Nonintractable episodic headache 01/21/2018  . Left-sided back pain 08/05/2017  . Chronic right shoulder pain 08/05/2017  . Current moderate episode of major depressive disorder without prior episode (HCC) 08/05/2017  . Chest pain on exertion 07/22/2016  . Dyspnea on exertion 06/04/2016  . Diabetic peripheral neuropathy (HCC) 06/04/2016  . Dizziness 05/07/2016  . GERD (gastroesophageal reflux disease) 10/28/2013  . Healthcare maintenance 09/07/2012  . Hyperlipidemia 04/13/2008  . Essential hypertension 04/13/2008  . Coronary atherosclerosis 04/13/2008  . Type 2 diabetes mellitus with both eyes affected by proliferative retinopathy without macular edema, without long-term current use of insulin (HCC) 04/13/2008   Home Medication(s) Prior to Admission medications   Medication Sig Start Date End Date Taking? Authorizing Provider  ACCU-CHEK FASTCLIX LANCETS MISC USE TO CHECK BLOOD SUGAR up to 4 TIMES DAILY. diag code E11.3599. Insulin dependent 10/14/17   Lanelle Bal, MD  amLODipine (NORVASC) 10 MG tablet TAKE 1 TABLET BY MOUTH ONCE DAILY 12/21/17   Nyra Market, MD  aspirin EC 81 MG tablet Take 81 mg by mouth daily.    [provider]  cephALEXin (KEFLEX) 500 MG capsule Take 1 capsule (500 mg total) by mouth 3 (three) times daily. 02/21/18   Delories Heinz, DPM  DULoxetine (CYMBALTA) 30 MG capsule Take 1 capsule (30 mg total) by mouth daily. 01/17/18   Nyra Market, MD  gabapentin (NEURONTIN) 300 MG capsule Take 1  capsule (300 mg total) by mouth 3 (three) times daily. 01/20/17   Nyra Market, MD  glucose blood (ACCU-CHEK GUIDE) test strip Use TO CHECK BLOOD SUGAR 4 TIMES DAILY. DIAG CODE E11.9 INSULIN DEPENDENT 02/03/18    Nyra Market, MD  Insulin Glargine (LANTUS) 100 UNIT/ML Solostar Pen Inject 42 Units into the skin daily at 10 pm. 01/19/18   Nyra Market, MD  insulin lispro (HUMALOG) 100 UNIT/ML KiwkPen Inject 0.09 mLs (9 Units total) into the skin 3 (three) times daily. 10/14/17   Lanelle Bal, MD  lisinopril (PRINIVIL,ZESTRIL) 40 MG tablet TAKE 1 TABLET BY MOUTH ONCE DAILY 01/17/18   Nyra Market, MD  metFORMIN (GLUCOPHAGE) 1000 MG tablet Take 1 tablet (1,000 mg total) by mouth daily with breakfast. 08/04/17   Nyra Market, MD  metoprolol succinate (TOPROL-XL) 100 MG 24 hr tablet TAKE ONE TABLET BY MOUTH ONCE DAILY WITH OR  IMMEDIATELY FOLLOWING A MEAL. 08/04/17   Nyra Market, MD  polyethylene glycol (MIRALAX / GLYCOLAX) packet Take 17 g by mouth daily. 11/25/15   Beather Arbour, MD  pravastatin (PRAVACHOL) 10 MG tablet Take 1 tablet (10 mg total) by mouth every evening. 01/19/18 01/19/19  Nyra Market, MD  RELION PEN NEEDLES 32G X 4 MM MISC USE TO INJECT INSULIN 4 TIMES A DAY 05/28/17   Nyra Market, MD                                                                                                                                    Past Surgical History Past Surgical History:  Procedure Laterality Date  . CARDIAC CATHETERIZATION    . cataract    . CESAREAN SECTION     Family History Family History  Problem Relation Age of Onset  . Hypertension Mother   . Hypertension Father     Social History Social History   Tobacco Use  . Smoking status: Never Smoker  . Smokeless tobacco: Current User    Types: Chew  Substance Use Topics  . Alcohol use: No  . Drug use: No   Allergies Morphine and Olmesartan  Review of Systems Review of Systems  Unable to perform ROS: Patient unresponsive    Physical Exam Vital Signs  I have reviewed the triage vital signs BP 110/76   Pulse (!) 107   Resp 12   SpO2 100%   Physical Exam Vitals signs reviewed.  Constitutional:       Appearance: She is well-developed.  HENT:     Head: Normocephalic and atraumatic.     Comments: King airway in place     Nose: Nose normal.  Eyes:     General: No scleral icterus.       Right eye: No discharge.        Left eye: No discharge.     Conjunctiva/sclera: Conjunctivae normal.     Pupils: Pupils are equal.     Right eye: Pupil is  not reactive.     Left eye: Pupil is not reactive.  Neck:     Musculoskeletal: Normal range of motion and neck supple.  Cardiovascular:     Rate and Rhythm: Normal rate and regular rhythm.     Heart sounds: No murmur. No friction rub. No gallop.   Pulmonary:     Effort: Bradypnea present.     Breath sounds: Rales present.     Comments: spontaneous breathing by patient Abdominal:     General: There is no distension.     Palpations: Abdomen is soft.     Tenderness: There is no abdominal tenderness.  Musculoskeletal:        General: No tenderness.  Skin:    General: Skin is cool and dry.     Findings: No erythema or rash.  Neurological:     Mental Status: She is unresponsive.     ED Results and Treatments Labs (all labs ordered are listed, but only abnormal results are displayed) Labs Reviewed  CBC WITH DIFFERENTIAL/PLATELET - Abnormal; Notable for the following components:      Result Value   WBC 19.2 (*)    RBC 5.29 (*)    Hemoglobin 15.5 (*)    HCT 48.3 (*)    Neutro Abs 11.2 (*)    Lymphs Abs 6.4 (*)    Abs Immature Granulocytes 0.66 (*)    All other components within normal limits  PROTIME-INR  APTT  COMPREHENSIVE METABOLIC PANEL  TROPONIN I  LIPID PANEL                                                                                                                         EKG  EKG Interpretation  Date/Time:  Saturday 2018/03/22 02:11:28 EST Ventricular Rate:  89 PR Interval:    QRS Duration: 149 QT Interval:  412 QTC Calculation: 502 R Axis:   -25 Text Interpretation:  Accelerated junctional rhythm IVCD,  consider atypical LBBB Confirmed by Drema Pry ([id number]) on Mar 22, 2018 2:55:45 AM      Radiology No results found. Pertinent labs & imaging results that were available during my care of the patient were reviewed by me and considered in my medical decision making (see chart for details).  Medications Ordered in ED Medications  0.9 %  sodium chloride infusion (1,000 mLs Intravenous New Bag/Given 03/22/18 0215)  heparin injection 5,000 Units (has no administration in time range)  aspirin suppository 300 mg (300 mg Rectal Given 03-22-2018 0234)  EPINEPHrine (ADRENALIN) 1 MG/10ML injection (1 Syringe Intravenous Given 03/22/2018 0216)  0.9 %  sodium chloride infusion (1,000 mLs Intravenous New Bag/Given 03-22-2018 0216)  etomidate (AMIDATE) injection (30 mg Intravenous Given 22-Mar-2018 0224)  rocuronium (ZEMURON) injection (75 mg Intravenous Given Mar 22, 2018 0225)  EPINEPHrine (ADRENALIN) 1 MG/10ML injection (1 Syringe Intravenous Given 03/22/18 0247)  DOPamine (INTROPIN) 800 mg in dextrose 5 % 250 mL (3.2 mg/mL) infusion (10 mcg/kg/min  100 kg (Order-Specific) Intravenous New Bag/Given 03-22-18 0240)  DOPamine (INTROPIN) 800 mg in dextrose 5 % 250 mL (3.2 mg/mL) infusion (15 mcg/kg/min  100 kg (Order-Specific) Intravenous New Bag/Given Mar 26, 2018 0252)                                                                                                                                    Procedures Procedure Name: Intubation Date/Time: 2018-03-26 3:58 AM Performed by: Nira Conn, MD Pre-anesthesia Checklist: Patient identified, Patient being monitored, Emergency Drugs available, Timeout performed and Suction available Oxygen Delivery Method: Non-rebreather mask Preoxygenation: Pre-oxygenation with 100% oxygen Induction Type: Rapid sequence Ventilation: Mask ventilation without difficulty Laryngoscope Size: Glidescope Tube size: 8.0 mm Number of attempts: 1 Airway Equipment and Method: Rigid  stylet Placement Confirmation: ETT inserted through vocal cords under direct vision,  CO2 detector and Breath sounds checked- equal and bilateral Secured at: 22 cm Tube secured with: ETT holder Difficulty Due To: Difficulty was anticipated     .Critical Care Performed by: Nira Conn, MD Authorized by: Nira Conn, MD     CRITICAL CARE Performed by: Amadeo Garnet  Total critical care time: 45 minutes Critical care time was exclusive of separately billable procedures and treating other patients. Critical care was necessary to treat or prevent imminent or life-threatening deterioration. Critical care was time spent personally by me on the following activities: development of treatment plan with patient and/or surrogate as well as nursing, discussions with consultants, evaluation of patient's response to treatment, examination of patient, obtaining history from patient or surrogate, ordering and performing treatments and interventions, ordering and review of laboratory studies, ordering and review of radiographic studies, pulse oximetry and re-evaluation of patient's condition.    EMERGENCY DEPARTMENT [place] CARDIAC EXAM "Study: Limited Ultrasound of the Heart and Pericardium"  INDICATIONS:Cardiac arrest Multiple views of the heart and pericardium were obtained in real-time with a multi-frequency probe.  PERFORMED JG:GEZMOQ IMAGES ARCHIVED?: No LIMITATIONS:  Emergent procedure VIEWS USED: Subcostal 4 chamber INTERPRETATION: Cardiac activity present, Pericardial effusioin absent and Increased contractility   (including critical care time)  Medical Decision Making / ED Course I have reviewed the nursing notes for this encounter and the patient's prior records (if available in EHR or on provided paperwork).    On arrival, patient was hypotensive with systolics in the 80s.  Heart rate in the 80s.  Given another milligram of epinephrine and 2 L of IV fluids  were started.  Blood pressure improved.  Airway was secured with ET tube.   EKG with new left bundle branch block.  Cardiology arrived at bedside and went to go speak with family.  Patient required additional milligram of epinephrine after bradycardia and down.  She was started on dobutamine.  Cardiology plans to take patient to the Cath Lab.  Prior to transport, patient was noted to have complete heart block by cardiology and coded again.  ACLS was initiated and patient was given additional doses of epinephrine.  External transcutaneous pacing was initiated.  We were able to maintain a good blood pressure.  Bedside echo revealed no pericardial effusion; severely depressed global hypokinesis.  Patient was transported to the Cath Lab for further management.  Final Clinical Impression(s) / ED Diagnoses Final diagnoses:  Cardiac arrest [facility])      This chart was dictated using voice recognition software.  Despite best efforts to proofread,  errors can occur which can change the documentation meaning.     Nira Conn, MD 03/15/2018 [phone]

## 2018-03-06 NOTE — H&P (Signed)
Cardiology Admission History and Physical:   Patient ID: Destiny M Schnepp MRN: [id number]; DOB: 1950-12-11   Admission date: March 27, 2018  Primary Care Provider: Nyra Market, MD Primary Cardiologist: No primary care provider on file. Dr. Gery Pray Primary Electrophysiologist:  None   Chief Complaint:  Cardiac arrest  Patient Profile:   Destiny Russell is a 68 y.o. female with htn and typw 2 diab, poorly controlled hgba1c 9.5, being admitted after witnessed cardiac arrest.  History of Present Illness:   Destiny Russell is with hypertension and poorly" controlled diabetes, with concerns of peripheral artery disease who was recently complaining of pain in her right toe and ulcer reducing her mobility who had a sudden cardiac arrest witnessed by her son.  She received CPR almost immediately and was brought emergently to the ER where she had ROSC and was intubated for airway support.  She had also reportedly been having chest discomfort for a few days.  While in the ER patient had episodes of bradycardia and complete heart block without an escape rhythm that that led to recurrent arrest and required CPR.  She also received epinephrine and IV as well as dopamine infusion. Patient was stabilized was taken to Cath Lab.  Of note she has had a new left bundle branch block which is compared to her prior EKG.  Past Medical History:  Diagnosis Date  . Coronary atherosclerosis 04/13/2008   Cath in 2001 (for [facility]) showed small vessel coronary disease; Myoview 2010 probable soft tissue attenuation but overall normal perfusion and normal LV function. An echocardiogram 2004 revealed normal LV function, mild LAE and mild MR. Myoview 2018 low risk.  Marland Kitchen GERD (gastroesophageal reflux disease)   . History of cataract surgery 03/01/2015  . Hyperlipidemia 04/13/2008   Declines statins - myalgias  . Hypertension   . Nail avulsion of toe 11/26/2015   right great toe - nontraumatic  . Parkinson's disease (HCC)    questionable; patient endorses diagnosis; no symptoms  . Type 2 diabetes mellitus with proliferative retinopathy without macular edema (HCC) 04/13/2008   06/06/14 [facility]: Severe proliferative retinopathy with clinically significant macular edema, stable vitreous hemorrhage of left eye, and stable age-related nuclear cataract of both eyes.  08/09/14[facility]: Bilateral rapidly progressive diabetic retinopathy with no macular edema     Past Surgical History:  Procedure Laterality Date  . CARDIAC CATHETERIZATION    . cataract    . CESAREAN SECTION       Medications Prior to Admission: Prior to Admission medications   Medication Sig Start Date End Date Taking? Authorizing Provider  ACCU-CHEK FASTCLIX LANCETS MISC USE TO CHECK BLOOD SUGAR up to 4 TIMES DAILY. diag code E11.3599. Insulin dependent 10/14/17   Lanelle Bal, MD  amLODipine (NORVASC) 10 MG tablet TAKE 1 TABLET BY MOUTH ONCE DAILY 12/21/17   Nyra Market, MD  aspirin EC 81 MG tablet Take 81 mg by mouth daily.    [provider]  cephALEXin (KEFLEX) 500 MG capsule Take 1 capsule (500 mg total) by mouth 3 (three) times daily. 02/21/18   Delories Heinz, DPM  DULoxetine (CYMBALTA) 30 MG capsule Take 1 capsule (30 mg total) by mouth daily. 01/17/18   Nyra Market, MD  gabapentin (NEURONTIN) 300 MG capsule Take 1 capsule (300 mg total) by mouth 3 (three) times daily. 01/20/17   Nyra Market, MD  glucose blood (ACCU-CHEK GUIDE) test strip Use TO CHECK BLOOD SUGAR 4 TIMES DAILY. DIAG CODE E11.9 INSULIN  DEPENDENT 02/03/18   Nyra Market, MD  Insulin Glargine (LANTUS) 100 UNIT/ML Solostar Pen Inject 42 Units into the skin daily at 10 pm. 01/19/18   Nyra Market, MD  insulin lispro (HUMALOG) 100 UNIT/ML KiwkPen Inject 0.09 mLs (9 Units total) into the skin 3 (three) times daily. 10/14/17   Lanelle Bal, MD  lisinopril (PRINIVIL,ZESTRIL) 40 MG tablet TAKE 1 TABLET BY MOUTH  ONCE DAILY 01/17/18   Nyra Market, MD  metFORMIN (GLUCOPHAGE) 1000 MG tablet Take 1 tablet (1,000 mg total) by mouth daily with breakfast. 08/04/17   Nyra Market, MD  metoprolol succinate (TOPROL-XL) 100 MG 24 hr tablet TAKE ONE TABLET BY MOUTH ONCE DAILY WITH OR  IMMEDIATELY FOLLOWING A MEAL. 08/04/17   Nyra Market, MD  polyethylene glycol (MIRALAX / GLYCOLAX) packet Take 17 g by mouth daily. 11/25/15   Beather Arbour, MD  pravastatin (PRAVACHOL) 10 MG tablet Take 1 tablet (10 mg total) by mouth every evening. 01/19/18 01/19/19  Nyra Market, MD  RELION PEN NEEDLES 32G X 4 MM MISC USE TO INJECT INSULIN 4 TIMES A DAY 05/28/17   Nyra Market, MD     Allergies:    Allergies  Allergen Reactions  . Morphine     REACTION: Reaction not known  . Olmesartan     nausea    Social History:   Social History   Socioeconomic History  . Marital status: Married    Spouse name: Not on file  . Number of children: Not on file  . Years of education: 9th  . Highest education level: Not on file  Occupational History  . Not on file  Social Needs  . Financial resource strain: Not on file  . Food insecurity:    Worry: Not on file    Inability: Not on file  . Transportation needs:    Medical: Not on file    Non-medical: Not on file  Tobacco Use  . Smoking status: Never Smoker  . Smokeless tobacco: Current User    Types: Chew  Substance and Sexual Activity  . Alcohol use: No  . Drug use: No  . Sexual activity: Not on file  Lifestyle  . Physical activity:    Days per week: Not on file    Minutes per session: Not on file  . Stress: Not on file  Relationships  . Social connections:    Talks on phone: Not on file    Gets together: Not on file    Attends religious service: Not on file    Active member of club or organization: Not on file    Attends meetings of clubs or organizations: Not on file    Relationship status: Not on file  . Intimate partner violence:    Fear of  current or ex partner: Not on file    Emotionally abused: Not on file    Physically abused: Not on file    Forced sexual activity: Not on file  Other Topics Concern  . Not on file  Social History Narrative  . Not on file    Family History:   The patient's family history includes Hypertension in her father and mother.     Review of Systems: [y] = yes, [ ]  = no   . General: Weight gain [ ] ; Weight loss [ ] ; Anorexia [ ] ; Fatigue [ ] ; Fever [ ] ; Chills [ ] ; Weakness [ ]   . Cardiac: Chest pain/pressure [ ] ; Resting SOB [ ] ; Exertional SOB [ ] ; Orthopnea [ ] ;  Pedal Edema ; Palpitations ; Syncope ; Presyncope ; Paroxysmal nocturnal dyspnea[ ]   . Pulmonary: Cough ; Wheezing[ ] ; Hemoptysis[ ] ; Sputum ; Snoring   . GI: Vomiting[ ] ; Dysphagia[ ] ; Melena[ ] ; Hematochezia ; Heartburn[ ] ; Abdominal pain ; Constipation ; Diarrhea ; BRBPR   . GU: Hematuria[ ] ; Dysuria ; Nocturia[ ]   . Vascular: Pain in legs with walking ; Pain in feet with lying flat ; Non-healing sores ; Stroke ; TIA ; Slurred speech ;  . Neuro: Headaches[ ] ; Vertigo[ ] ; Seizures[ ] ; Paresthesias[ ] ;Blurred vision ; Diplopia ; Vision changes   . Ortho/Skin: Arthritis ; Joint pain ; Muscle pain ; Joint swelling ; Back Pain ; Rash   . Psych: Depression[ ] ; Anxiety[ ]   . Heme: Bleeding problems ; Clotting disorders ; Anemia   . Endocrine: Diabetes ; Thyroid dysfunction[ ]   Physical Exam/Data:   Vitals:   2018-03-14 0241 2018-03-14 0245 2018-03-14 0251 14-Mar-2018 0252  BP: 116/77 (!) 140/47 (!) 95/37 110/76  Pulse: (!) 47 (!) 113 77 (!) 107  Resp: 18 (!) SpO2: (!) 80% (!) 75% 100% 100%  Weight:   90.6 kg   Height:   5' 2.5" (1.588 m)    No intake or output data in the 24 hours ending 2018/03/14 0305 Filed Weights   03/14/18 0251  Weight: 90.6 kg   Body mass index is 35.95 kg/m.  General: Intubated and unconscious HEENT:  normal Lymph: no adenopathy Neck: no JVD Endocrine:  No thryomegaly Vascular: Right gangrenous ulceration fourth metatarsal Cardiac: Bradycardic, regular S1, S2; RRR; no murmur  Lungs:  clear to auscultation bilaterally, no wheezing, rhonchi or rales  Abd: soft, nontender, no hepatomegaly  Ext: no edema Musculoskeletal:  No deformities, BUE and BLE strength normal and equal Skin: warm and dry  Neuro:  CNs 2-12 intact, no focal abnormalities noted Psych:  Normal affect    EKG:  The ECG that was done 2/22 was personally reviewed and demonstrates  sinus rhythm with a first-degree AV block with left bundle branch block  Relevant CV Studies:   Laboratory Data:  ChemistryNo results for input(s): NA, K, CL, CO2, GLUCOSE, BUN, CREATININE, CALCIUM, GFRNONAA, GFRAA, ANIONGAP in the last 168 hours.  No results for input(s): PROT, ALBUMIN, AST, ALT, ALKPHOS, BILITOT in the last 168 hours. Hematology Recent Labs  Lab 14-Mar-2018 0219  WBC 19.2*  RBC 5.29*  HGB 15.5*  HCT 48.3*  MCV 91.3  MCH 29.3  MCHC 32.1  RDW 12.2  PLT 295   Cardiac EnzymesNo results for input(s): TROPONINI in the last 168 hours. No results for input(s): TROPIPOC in the last 168 hours.  BNPNo results for input(s): BNP, PROBNP in the last 168 hours.  DDimer No results for input(s): DDIMER in the last 168 hours.  Radiology/Studies:  Dg Chest Portable 1 View  Result Date: 03/14/2018 CLINICAL DATA:  68 y/o  F; post arrest. EXAM: PORTABLE CHEST 1 VIEW COMPARISON:  08/30/2012 chest radiograph. FINDINGS: Endotracheal tube is just above the carina, consider 2-3 cm of retraction. Cardiac silhouette is unremarkable given projection and technique. Transcutaneous pacing pads noted. Endotracheal tube tip extends below the field of view into the abdomen. No pleural effusion or pneumothorax. Diffuse consolidation of the lungs. Bones are unremarkable. IMPRESSION: Endotracheal tube is just above the  carina, consider 2-3 cm of  retraction. Diffuse consolidation of the lungs, possibly pneumonia, ARDS, or pulmonary edema. Electronically Signed   By: Mitzi Hansen M.D.   On: February 28, 2018 02:59    Assessment and Plan:   1. Witness Cardiac Arrest  PEA and also asystole 2. Foot ulcer 3. Presumed PAD 4. Poorly controlled diabetes 5. Hypertension 6. Dyslipidemia  -After stabilization to cath lab to evaluate anatomy. -dopamine drip and likely need for temporary pacemaker   CRITICAL CARE Performed by: Macie Burows   Total critical care time: 60 minutes  Critical care time was exclusive of separately billable procedures and treating other patients.  Critical care was necessary to treat or prevent imminent or life-threatening deterioration.  Critical care was time spent personally by me on the following activities: development of treatment plan with patient and/or surrogate as well as nursing, discussions with consultants, evaluation of patient's response to treatment, examination of patient, obtaining history from patient or surrogate, ordering and performing treatments and interventions, ordering and review of laboratory studies, ordering and review of radiographic studies, pulse oximetry and re-evaluation of patient's condition.   Severity of Illness: The appropriate patient status for this patient is INPATIENT. Inpatient status is judged to be reasonable and necessary in order to provide the required intensity of service to ensure the patient's safety. The patient's presenting symptoms, physical exam findings, and initial radiographic and laboratory data in the context of their chronic comorbidities is felt to place them at high risk for further clinical deterioration. Furthermore, it is not anticipated that the patient will be medically stable for discharge from the hospital within 2 midnights of admission. The following factors support the patient status of inpatient.   " The patient's presenting  symptoms include cardiac arrest. " The worrisome physical exam findings include resp failure. " The initial radiographic and laboratory data are worrisome because of cardiac arrest. " The chronic co-morbidities include dm2, htn.   * I certify that at the point of admission it is my clinical judgment that the patient will require inpatient hospital care spanning beyond 2 midnights from the point of admission due to high intensity of service, high risk for further deterioration and high frequency of surveillance required.*    For questions or updates, please contact [facility] Please consult [email or web address] for contact info under        Signed, Macie Burows, MD  2018-02-28 3:05 AM

## 2018-03-06 NOTE — Death Summary Note (Addendum)
DEATH SUMMARY   Patient Details  Name: Destiny Russell MRN: [id number] DOB: [birth year]  Admission/Discharge Information   Admit Date:  2018/03/07  Date of Death: Mar 07, 2018  Time of Death: 3:12 AM  Length of Stay: 0  Referring Physician: Nyra Market, MD   Reason(s) for Hospitalization  Cardiac arrest  Diagnoses  Preliminary cause of death: Cardiac arrest [facility]) Secondary Diagnoses (including complications and co-morbidities):  Principal Problem:   Cardiac arrest [facility]) Active Problems:   Diabetes mellitus with coincident hypertension (HCC)   New onset left bundle branch block (LBBB)   Dyslipidemia (high LDL; low HDL)   Skin ulcer of right foot, limited to breakdown of skin [facility])   Brief Hospital Course (including significant findings, care, treatment, and services provided and events leading to death)  Destiny Russell is a 68 y.o. year old female who presented via EMS after cardiac arrest.  Per her family, she reported 1-2 days of chest pain and shortness of breath.  Around 1 AM, the patient complained of worsening dyspnea and became unresponsive.  CPR was started immediately by her son and EMS summoned.  EMS found the patient to be unresponsive with agonal breathing and absent pulse with asystole on the monitor.  ROSC was obtained after further CPR and epinephrine.  She had cardiac arrest again prior to arrival in the ED but was successfully resuscitated with CPR.  In the ED, she had intermittent hypotension and bradycardia requiring boluses of epinephrine and brief CPR. Bedside echo performed by the EDP (personally viewed) showed no significant pericardial effusion.  LVEF was severely depressed with global hypokinesis. She developed complete heart block with loss of pulse.  Dopamine infusion and transcutaneous pacing were initiated with adequate capture but continued hypotension.  King airway was exchanged for and ETT by the ED physician.  The patient was brought to the cath lab  but lost her pulse and pacing capture before being moved to the cath lab.  The patient was coded with CPR, epinephrine, bicarbonate, and transcutaneous pacing for an additional 20 minutes.  She never regained a pulse.  Cardiac monitoring showed asystole.  No heart sounds were appreciated on exam.  Pupils were fixed and dilated.  The patient was pronounced dead at 3:12 AM on 2018/03/07.  The patient's family (husband, son, and daughter) were updated on the events and visited with the patient after her death.  One hour of critical care time was spent evaluating the patient and directing cardiopulmonary resuscitation in the ED and cath lab.  Pertinent Labs and Studies  Significant Diagnostic Studies Mr Brain Wo Contrast  Result Date: 02/09/2018 CLINICAL DATA:  Chronic headaches for several months. EXAM: MRI HEAD WITHOUT CONTRAST TECHNIQUE: Multiplanar, multiecho pulse sequences of the brain and surrounding structures were obtained without intravenous contrast. COMPARISON:  08/31/2012 FINDINGS: Brain: There is no evidence of acute infarct, intracranial hemorrhage, mass, midline shift, or extra-axial fluid collection. Mild cerebral atrophy is within normal limits for age. Patchy T2 hyperintensities in the cerebral white matter and pons have mildly progressed from the prior study and are nonspecific but compatible with moderate chronic small vessel ischemic disease. There are two chronic lacunar infarcts in the right thalamus with the more superiorly located infarct being new. There may be punctate chronic infarcts in the cerebellum. Vascular: Major intracranial vascular flow voids are preserved. Skull and upper cervical spine: Unremarkable bone marrow signal. Sinuses/Orbits: Bilateral cataract extraction. Mild bilateral ethmoid and maxillary sinus mucosal thickening. Clear mastoid air cells. Other: None. IMPRESSION: 1.  No acute intracranial abnormality. 2. Moderate chronic small vessel ischemic disease, mildly  progressed from 2014. Electronically Signed   By: Sebastian Ache M.D.   On: 02/09/2018 12:14   Dg Chest Portable 1 View  Result Date: 03-25-2018 CLINICAL DATA:  68 y/o  F; post arrest. EXAM: PORTABLE CHEST 1 VIEW COMPARISON:  08/30/2012 chest radiograph. FINDINGS: Endotracheal tube is just above the carina, consider 2-3 cm of retraction. Cardiac silhouette is unremarkable given projection and technique. Transcutaneous pacing pads noted. Endotracheal tube tip extends below the field of view into the abdomen. No pleural effusion or pneumothorax. Diffuse consolidation of the lungs. Bones are unremarkable. IMPRESSION: Endotracheal tube is just above the carina, consider 2-3 cm of retraction. Diffuse consolidation of the lungs, possibly pneumonia, ARDS, or pulmonary edema. Electronically Signed   By: Mitzi Hansen M.D.   On: March 25, 2018 02:59   Dg Foot Complete Right  Result Date: 02/21/2018 Please see detailed radiograph report in office note.  Dg Foot Complete Right  Result Date: 02/07/2018 Please see detailed radiograph report in office note.   Microbiology No results found for this or any previous visit (from the past 240 hour(s)).  Lab Basic Metabolic Panel: Recent Labs  Lab 03/25/2018 0219  NA 135  K 4.0  CL 99  CO2 14*  GLUCOSE 497*  BUN 22  CREATININE 2.00*  CALCIUM 8.2*   Liver Function Tests: Recent Labs  Lab 03/25/2018 0219  AST 128*  ALT 72*  ALKPHOS 86  BILITOT 1.2  PROT 6.5  ALBUMIN 3.2*   No results for input(s): LIPASE, AMYLASE in the last 168 hours. No results for input(s): AMMONIA in the last 168 hours. CBC: Recent Labs  Lab 03/25/18 0219  WBC 19.2*  NEUTROABS 11.2*  HGB 15.5*  HCT 48.3*  MCV 91.3  PLT 295   Cardiac Enzymes: No results for input(s): CKTOTAL, CKMB, CKMBINDEX, TROPONINI in the last 168 hours. Sepsis Labs: Recent Labs  Lab 25-Mar-2018 0219  WBC 19.2*    Procedures/Operations  None   Danaiya Steadman 03-25-2018, 3:37 AM

## 2018-03-06 NOTE — ED Triage Notes (Signed)
Patient from home, had a sudden onset of shortness of breath and chest pain at 0045.  Patient became unresponsive at 0050 and CPR started by son.  Upon EMS arrival, 2 epis given and ROSC was obtained at 0120.  Patient initial rhythm of Asystole before ROSC.  Upon arrival, patient with HR of 100, BP of 233/116.  Capnography in the 30's.  I/O left tibia.  King airway in place.

## 2018-03-06 DEATH — deceased

## 2018-03-07 ENCOUNTER — Ambulatory Visit: Payer: Medicare Other | Admitting: Podiatrist

## 2018-03-14 ENCOUNTER — Encounter (HOSPITAL_COMMUNITY): Payer: Medicare Other

## 2018-03-16 ENCOUNTER — Encounter: Payer: Medicare Other | Admitting: Internal Medicine

## 2018-03-30 ENCOUNTER — Ambulatory Visit: Payer: Medicare Other

## 2018-03-30 ENCOUNTER — Other Ambulatory Visit: Payer: Medicare Other

## 2019-02-21 IMAGING — MR MR HEAD W/O CM
10 of 11 series · 43 of 48 positions shown · non-contrast
Comparison: 08/31/2012

CLINICAL DATA: Chronic headaches for several months.

EXAM:
MRI HEAD WITHOUT CONTRAST
TECHNIQUE: Multiplanar, multiecho pulse sequences of the brain and surrounding
structures were obtained without intravenous contrast.

[Series 5: DWI · axial · 3.0mm · 0.88mm/px · z∈[-50,+84]mm · 10 of 92 slices shown (1 of 4)]
[im 1/92]
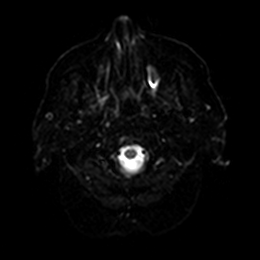
[im 11/92]
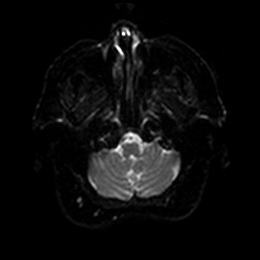
[im 21/92]
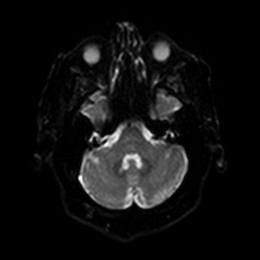
[im 31/92]
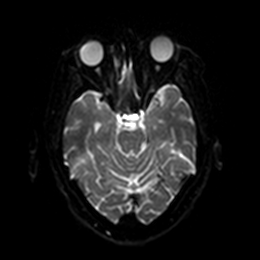
[im 41/92]
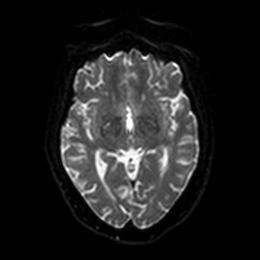
[im 51/92]
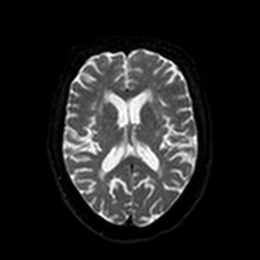
[im 61/92]
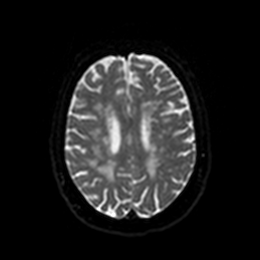
[im 71/92]
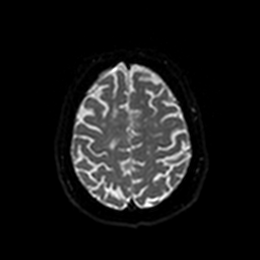
[im 81/92]
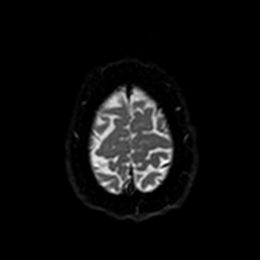
[im 92/92]
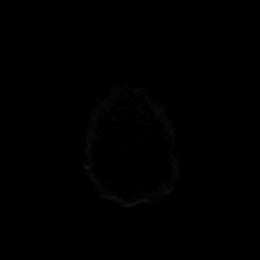

[Series 6: DWI · axial · 3.0mm · 0.88mm/px · z∈[-50,+84]mm · 5 of 46 slices shown (2 of 4)]
[im 1/46]
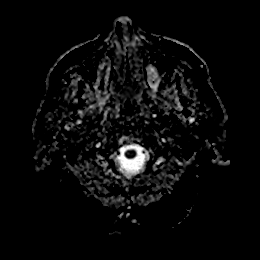
[im 12/46]
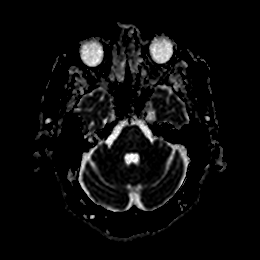
[im 23/46]
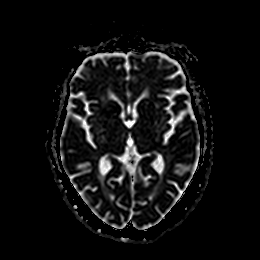
[im 34/46]
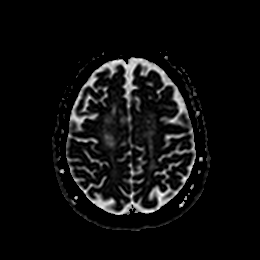
[im 46/46]
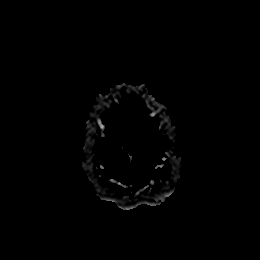

[Series 7: DWI · coronal · 4.0mm · 0.88mm/px · 6 of 68 slices shown (3 of 4)]
[im 1/68]
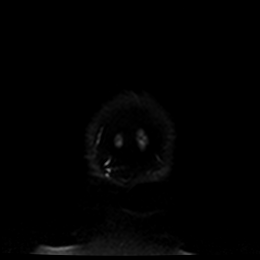
[im 14/68]
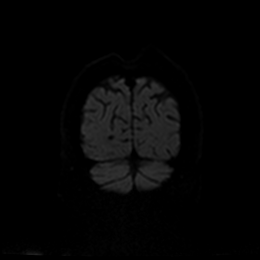
[im 27/68]
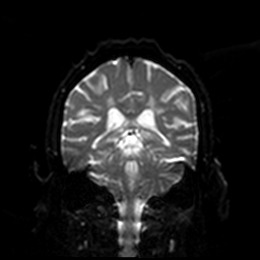
[im 41/68]
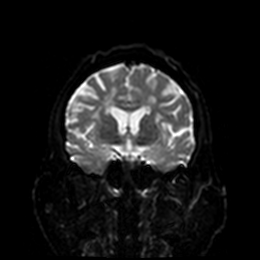
[im 54/68]
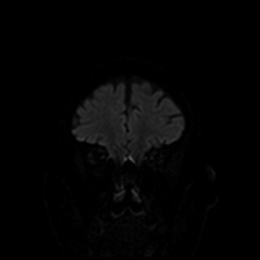
[im 68/68]
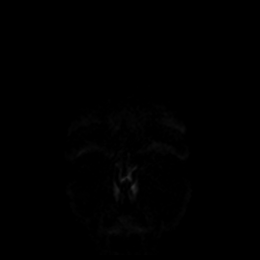

[Series 8: DWI · coronal · 4.0mm · 0.88mm/px · 3 of 34 slices shown (4 of 4)]
[im 1/34]
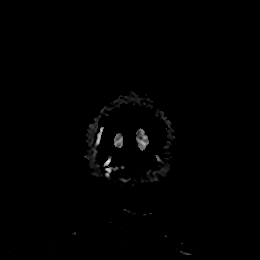
[im 17/34]
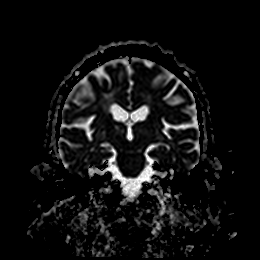
[im 34/34]
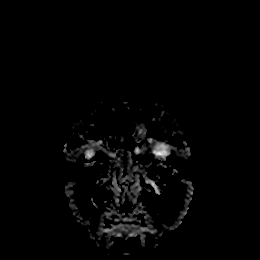

[Series 9: T1 · sagittal · 5.0mm · 0.75mm/px · 2 of 23 slices shown]
[im 1/23]
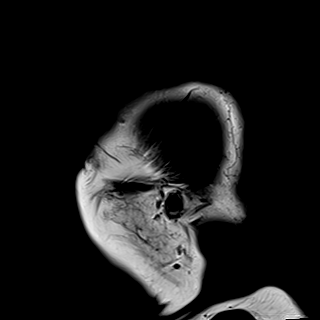
[im 23/23]
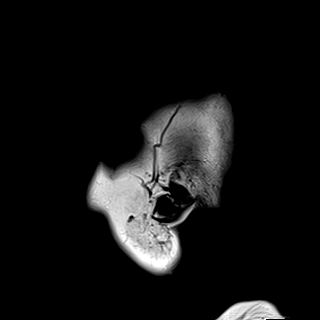

[Series 10: T2 · axial · 5.0mm · 0.72mm/px · z∈[-64,+80]mm · 2 of 25 slices shown (1 of 2)]
[im 1/25]
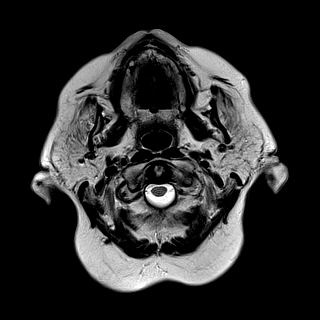
[im 25/25]
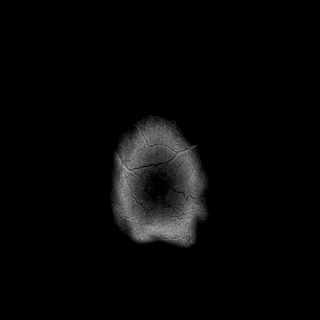

[Series 11: FLAIR · axial · 5.0mm · 0.45mm/px · z∈[-62,+82]mm · 2 of 25 slices shown]
[im 1/25]
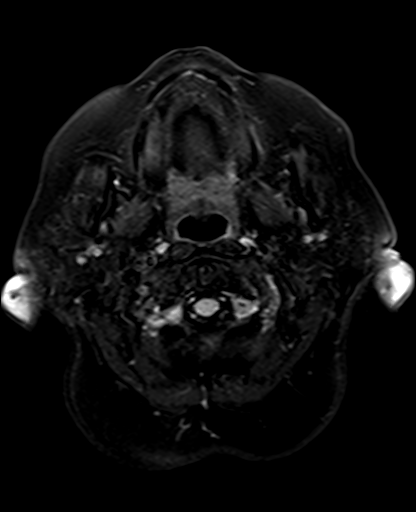
[im 25/25]
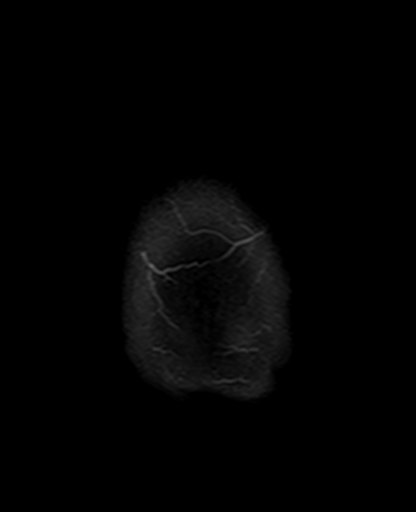

[Series 13: pha_images · axial · 3.0mm · 0.90mm/px · z∈[-58,+103]mm · 5 of 55 slices shown]
[im 1/55]
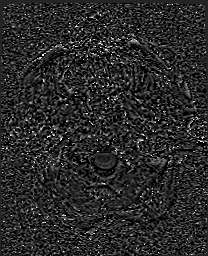
[im 14/55]
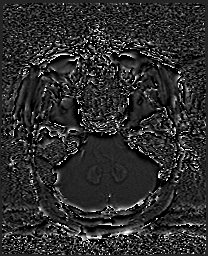
[im 28/55]
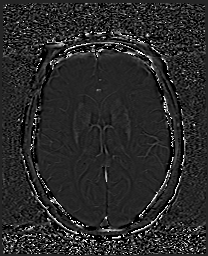
[im 41/55]
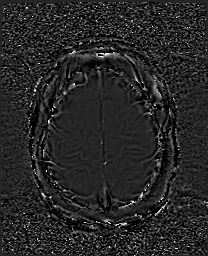
[im 55/55]
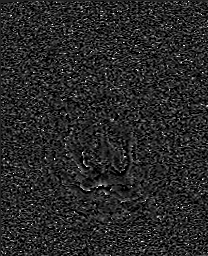

[Series 14: swi_images · axial · 3.0mm · 0.90mm/px · z∈[-61,+103]mm · 5 of 56 slices shown]
[im 1/56]
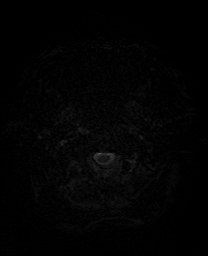
[im 14/56]
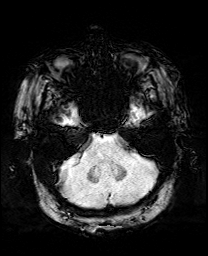
[im 28/56]
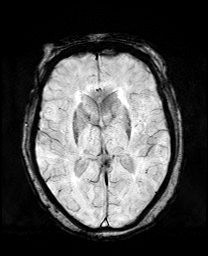
[im 42/56]
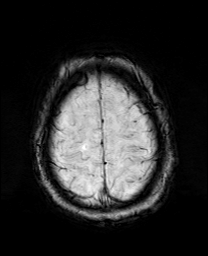
[im 56/56]
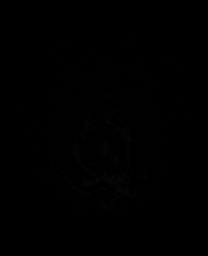

[Series 17: T2 · coronal · 5.0mm · 0.34mm/px · 3 of 29 slices shown (2 of 2)]
[im 1/29]
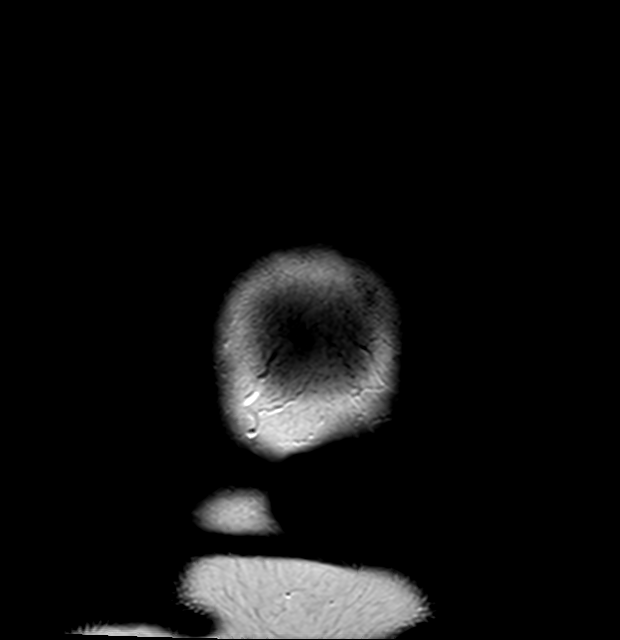
[im 15/29]
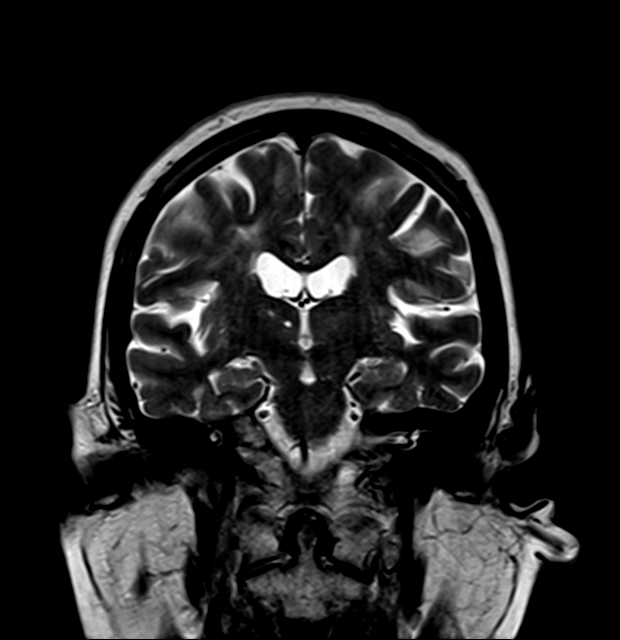
[im 29/29]
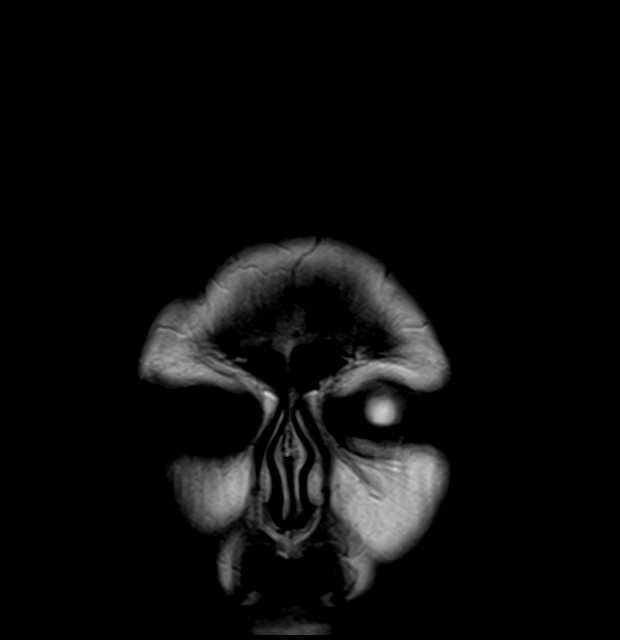

[43 of 48 positions shown; findings below may reference images not displayed]

FINDINGS: Brain: There is no evidence of acute infarct, intracranial
hemorrhage, mass, midline shift, or extra-axial fluid collection.
Mild cerebral atrophy is within normal limits for age. Patchy T2
hyperintensities in the cerebral white matter and pons have mildly
progressed from the prior study and are nonspecific but compatible
with moderate chronic small vessel ischemic disease. There are two
chronic lacunar infarcts in the right thalamus with the more
superiorly located infarct being new. There may be punctate chronic
infarcts in the cerebellum.

Vascular: Major intracranial vascular flow voids are preserved.

Skull and upper cervical spine: Unremarkable bone marrow signal.

Sinuses/Orbits: Bilateral cataract extraction. Mild bilateral
ethmoid and maxillary sinus mucosal thickening. Clear mastoid air
cells.

Other: None.
IMPRESSION: 1. No acute intracranial abnormality.
2. Moderate chronic small vessel ischemic disease, mildly progressed
